# Patient Record
Sex: Female | Born: 1945 | Race: White | Hispanic: No | Marital: Married | Smoking: Former smoker
Health system: Southern US, Community
[De-identification: ages and names within clinical notes are randomized; demographics above are authoritative.]

## PROBLEM LIST (undated history)

## (undated) DIAGNOSIS — M8580 Other specified disorders of bone density and structure, unspecified site: Secondary | ICD-10-CM

## (undated) DIAGNOSIS — T7840XA Allergy, unspecified, initial encounter: Secondary | ICD-10-CM

## (undated) DIAGNOSIS — J45909 Unspecified asthma, uncomplicated: Secondary | ICD-10-CM

## (undated) DIAGNOSIS — N952 Postmenopausal atrophic vaginitis: Secondary | ICD-10-CM

## (undated) HISTORY — DX: Allergy, unspecified, initial encounter: T78.40XA

## (undated) HISTORY — DX: Postmenopausal atrophic vaginitis: N95.2

## (undated) HISTORY — PX: ABDOMINAL HYSTERECTOMY: SHX81

## (undated) HISTORY — DX: Unspecified asthma, uncomplicated: J45.909

## (undated) HISTORY — PX: SPINE SURGERY: SHX786

## (undated) HISTORY — PX: KNEE SURGERY: SHX244

## (undated) HISTORY — DX: Other specified disorders of bone density and structure, unspecified site: M85.80

---

## 1999-12-13 ENCOUNTER — Encounter: Payer: Self-pay | Admitting: *Deleted

## 1999-12-13 ENCOUNTER — Emergency Department (HOSPITAL_COMMUNITY): Admission: EM | Admit: 1999-12-13 | Discharge: 1999-12-13 | Payer: Self-pay | Admitting: *Deleted

## 2000-11-01 ENCOUNTER — Emergency Department (HOSPITAL_COMMUNITY): Admission: EM | Admit: 2000-11-01 | Discharge: 2000-11-01 | Payer: Self-pay | Admitting: Emergency Medicine

## 2000-11-01 ENCOUNTER — Encounter: Payer: Self-pay | Admitting: Emergency Medicine

## 2001-03-23 ENCOUNTER — Ambulatory Visit (HOSPITAL_COMMUNITY): Admission: RE | Admit: 2001-03-23 | Discharge: 2001-03-23 | Payer: Self-pay | Admitting: Family Medicine

## 2001-03-23 ENCOUNTER — Encounter: Payer: Self-pay | Admitting: Family Medicine

## 2001-07-07 ENCOUNTER — Encounter: Payer: Self-pay | Admitting: Physical Medicine and Rehabilitation

## 2001-07-07 ENCOUNTER — Ambulatory Visit (HOSPITAL_COMMUNITY)
Admission: RE | Admit: 2001-07-07 | Discharge: 2001-07-07 | Payer: Self-pay | Admitting: Physical Medicine and Rehabilitation

## 2002-05-28 ENCOUNTER — Encounter: Payer: Self-pay | Admitting: Family Medicine

## 2002-05-28 ENCOUNTER — Encounter: Admission: RE | Admit: 2002-05-28 | Discharge: 2002-05-28 | Payer: Self-pay | Admitting: Family Medicine

## 2002-08-23 ENCOUNTER — Ambulatory Visit (HOSPITAL_COMMUNITY)
Admission: RE | Admit: 2002-08-23 | Discharge: 2002-08-23 | Payer: Self-pay | Admitting: Physical Medicine and Rehabilitation

## 2002-08-23 ENCOUNTER — Encounter: Payer: Self-pay | Admitting: Physical Medicine and Rehabilitation

## 2003-05-12 ENCOUNTER — Ambulatory Visit (HOSPITAL_COMMUNITY): Admission: RE | Admit: 2003-05-12 | Discharge: 2003-05-13 | Payer: Self-pay | Admitting: Orthopaedic Surgery

## 2003-05-17 ENCOUNTER — Emergency Department (HOSPITAL_COMMUNITY): Admission: EM | Admit: 2003-05-17 | Discharge: 2003-05-17 | Payer: Self-pay | Admitting: Emergency Medicine

## 2003-08-02 ENCOUNTER — Ambulatory Visit (HOSPITAL_COMMUNITY): Admission: RE | Admit: 2003-08-02 | Discharge: 2003-08-02 | Payer: Self-pay | Admitting: Family Medicine

## 2005-02-18 ENCOUNTER — Emergency Department (HOSPITAL_COMMUNITY): Admission: EM | Admit: 2005-02-18 | Discharge: 2005-02-18 | Payer: Self-pay | Admitting: Emergency Medicine

## 2007-09-10 ENCOUNTER — Ambulatory Visit: Payer: Self-pay | Admitting: Internal Medicine

## 2007-09-10 DIAGNOSIS — M797 Fibromyalgia: Secondary | ICD-10-CM

## 2007-09-10 DIAGNOSIS — R5381 Other malaise: Secondary | ICD-10-CM | POA: Insufficient documentation

## 2007-09-10 DIAGNOSIS — J309 Allergic rhinitis, unspecified: Secondary | ICD-10-CM | POA: Insufficient documentation

## 2007-09-10 DIAGNOSIS — K219 Gastro-esophageal reflux disease without esophagitis: Secondary | ICD-10-CM | POA: Insufficient documentation

## 2007-09-10 DIAGNOSIS — R5383 Other fatigue: Secondary | ICD-10-CM

## 2007-09-16 ENCOUNTER — Encounter (INDEPENDENT_AMBULATORY_CARE_PROVIDER_SITE_OTHER): Payer: Self-pay | Admitting: Internal Medicine

## 2007-09-16 ENCOUNTER — Telehealth (INDEPENDENT_AMBULATORY_CARE_PROVIDER_SITE_OTHER): Payer: Self-pay | Admitting: *Deleted

## 2007-09-16 LAB — CONVERTED CEMR LAB
Alkaline Phosphatase: 93 units/L (ref 39–117)
Basophils Relative: 1 % (ref 0–1)
Calcium: 9.9 mg/dL (ref 8.4–10.5)
Cholesterol: 249 mg/dL — ABNORMAL HIGH (ref 0–200)
Creatinine, Ser: 0.73 mg/dL (ref 0.40–1.20)
Eosinophils Relative: 4 % (ref 0–5)
LDL Cholesterol: 153 mg/dL — ABNORMAL HIGH (ref 0–99)
Lymphocytes Relative: 36 % (ref 12–46)
MCHC: 32 g/dL (ref 30.0–36.0)
Monocytes Absolute: 0.3 10*3/uL (ref 0.1–1.0)
Monocytes Relative: 7 % (ref 3–12)
Neutrophils Relative %: 53 % (ref 43–77)
Platelets: 233 10*3/uL (ref 150–400)
Potassium: 4.2 meq/L (ref 3.5–5.3)
RDW: 13.6 % (ref 11.5–15.5)
Sodium: 141 meq/L (ref 135–145)
Total CHOL/HDL Ratio: 5.7
Total Protein: 7 g/dL (ref 6.0–8.3)
Triglycerides: 258 mg/dL — ABNORMAL HIGH (ref <150)
VLDL: 52 mg/dL — ABNORMAL HIGH (ref 0–40)

## 2007-09-17 ENCOUNTER — Ambulatory Visit (HOSPITAL_COMMUNITY): Admission: RE | Admit: 2007-09-17 | Discharge: 2007-09-17 | Payer: Self-pay | Admitting: Internal Medicine

## 2007-09-21 ENCOUNTER — Encounter (INDEPENDENT_AMBULATORY_CARE_PROVIDER_SITE_OTHER): Payer: Self-pay | Admitting: Internal Medicine

## 2007-11-26 ENCOUNTER — Encounter (INDEPENDENT_AMBULATORY_CARE_PROVIDER_SITE_OTHER): Payer: Self-pay | Admitting: Internal Medicine

## 2007-11-30 ENCOUNTER — Encounter (INDEPENDENT_AMBULATORY_CARE_PROVIDER_SITE_OTHER): Payer: Self-pay | Admitting: Internal Medicine

## 2007-12-16 ENCOUNTER — Telehealth (INDEPENDENT_AMBULATORY_CARE_PROVIDER_SITE_OTHER): Payer: Self-pay | Admitting: *Deleted

## 2008-01-20 ENCOUNTER — Ambulatory Visit: Payer: Self-pay | Admitting: Internal Medicine

## 2008-01-21 ENCOUNTER — Telehealth (INDEPENDENT_AMBULATORY_CARE_PROVIDER_SITE_OTHER): Payer: Self-pay | Admitting: *Deleted

## 2008-04-12 ENCOUNTER — Telehealth (INDEPENDENT_AMBULATORY_CARE_PROVIDER_SITE_OTHER): Payer: Self-pay | Admitting: *Deleted

## 2008-04-13 ENCOUNTER — Encounter (INDEPENDENT_AMBULATORY_CARE_PROVIDER_SITE_OTHER): Payer: Self-pay | Admitting: Internal Medicine

## 2008-08-02 ENCOUNTER — Encounter (INDEPENDENT_AMBULATORY_CARE_PROVIDER_SITE_OTHER): Payer: Self-pay | Admitting: *Deleted

## 2008-09-28 ENCOUNTER — Ambulatory Visit: Payer: Self-pay | Admitting: Internal Medicine

## 2008-09-28 DIAGNOSIS — R599 Enlarged lymph nodes, unspecified: Secondary | ICD-10-CM | POA: Insufficient documentation

## 2008-09-29 ENCOUNTER — Ambulatory Visit (HOSPITAL_COMMUNITY): Admission: RE | Admit: 2008-09-29 | Discharge: 2008-09-29 | Payer: Self-pay | Admitting: Internal Medicine

## 2008-09-30 ENCOUNTER — Encounter (INDEPENDENT_AMBULATORY_CARE_PROVIDER_SITE_OTHER): Payer: Self-pay | Admitting: Internal Medicine

## 2008-09-30 DIAGNOSIS — K115 Sialolithiasis: Secondary | ICD-10-CM | POA: Insufficient documentation

## 2008-10-05 ENCOUNTER — Encounter (INDEPENDENT_AMBULATORY_CARE_PROVIDER_SITE_OTHER): Payer: Self-pay | Admitting: Internal Medicine

## 2008-10-11 ENCOUNTER — Encounter (INDEPENDENT_AMBULATORY_CARE_PROVIDER_SITE_OTHER): Payer: Self-pay | Admitting: Internal Medicine

## 2008-10-24 ENCOUNTER — Encounter (INDEPENDENT_AMBULATORY_CARE_PROVIDER_SITE_OTHER): Payer: Self-pay | Admitting: Otolaryngology

## 2008-10-24 ENCOUNTER — Ambulatory Visit (HOSPITAL_BASED_OUTPATIENT_CLINIC_OR_DEPARTMENT_OTHER): Admission: RE | Admit: 2008-10-24 | Discharge: 2008-10-25 | Payer: Self-pay | Admitting: Otolaryngology

## 2008-11-11 ENCOUNTER — Encounter (INDEPENDENT_AMBULATORY_CARE_PROVIDER_SITE_OTHER): Payer: Self-pay | Admitting: Internal Medicine

## 2008-11-14 ENCOUNTER — Encounter (INDEPENDENT_AMBULATORY_CARE_PROVIDER_SITE_OTHER): Payer: Self-pay | Admitting: Internal Medicine

## 2010-07-17 NOTE — Op Note (Signed)
NAMESHAKEDRA, BEAM                 ACCOUNT NO.:  [id number]   MEDICAL RECORD NO.:  [id number]          PATIENT TYPE:  AMB   LOCATION:  DSC                          FACILITY:  [facility]   PHYSICIAN:  Karol T. Lazarus Salines, M.D. DATE OF BIRTH:  05-Jul-1945   DATE OF PROCEDURE:  10/24/2008  DATE OF DISCHARGE:                               OPERATIVE REPORT   PREOPERATIVE DIAGNOSIS:  Left submandibular sialadenitis/sialolithiasis.   POSTOPERATIVE DIAGNOSIS:  Left submandibular  sialadenitis/sialolithiasis.   PROCEDURE PERFORMED:  Left submandibular gland excision.   SURGEON:  Gloris Manchester. Lazarus Salines, MD   ASSISTANT:  Dr. Pollyann Kennedy.   ANESTHESIA:  General orotracheal.   BLOOD LOSS:  Minimal.   COMPLICATIONS:  None.   FINDINGS:  A normal-sized submandibular gland with a roughly 7-mm stone  lodged in the duct just anterior to the hilum.  No other identified  stones.  Minimal fibrosis.   DESCRIPTION OF PROCEDURE:  With the patient in a comfortable supine  position, general orotracheal anesthesia was induced without difficulty.  At an appropriate level, the neck was extended and the head rotated to  the right for access to the left neck.  The head was appropriately  supported.  The neck was palpated with the findings as described above.  The oral cavity was also palpated with a stone palpable in the posterior  lingual gutter on the left side.  No other stones were noted.  An early  skin wrinkle was identified in the left neck and was infiltrated with 5  mL of 1% Xylocaine with 1:100,000 epinephrine.  A sterile preparation  and draping of left neck was accomplished in the standard fashion.   The wrinkle was again identified.  A 6-cm incision was marked and then  executed and carried down through skin relatively abundant subcutaneous  fat to the platysma layer, which was lysed using a cautery.  A  subplatysmal plane was raised upwards almost to the level of the body of  the mandible.  With palpation,  the inferior pole of the submandibular  gland was identified, and the fascia was opened at this level and the  dissection was carried out beneath the fascia.  Dissection was worked  around the gland.  At the posterior aspect of the gland, branches of the  facial artery and vein were controlled with silk ligatures entering the  gland and again exiting the gland.  The mylohyoid muscle was identified  and retracted forward.  The dissection was carried deep to the mandible  and the lingual nerve was identified and protected.  The submandibular  ganglion was cross-clamped and controlled with silk ligature.  The gland  was dissected further forward.  The hypoglossal nerve was identified,  but was not dissected.  Some small branches of ranine vein were  controlled with silk ligature.  With palpation, the stone was  identified.  Working anterior to the stone, the duct was identified and  was cross-clamped, amputated, and ligated with 3-0 silk.  The remainder  of the dissection beneath the mylohyoid muscle was completed with  protection of both lingual and hypoglossal  nerves.  The specimen was  sent for permanent interpretation.  One small area of bleeding was  controlled with cautery away from the hypoglossal nerve.  Valsalva was  administered and no significant bleeding was identified.  The wound was  thoroughly irrigated.   A 7-French round drain was placed in the wound and secured to the skin  with a 3-0 Ethilon stitch.  The platysma layer was reapproximated with  interrupted 4-0 chromic suture.  The skin was closed in a cosmetic  fashion with a 5-0 running subcuticular Ethilon.  The wound was cleaned,  and a small amount of bacitracin ointment was applied.  The drain was  placed on suction and observed to be functional.  Hemostasis was  observed.  At this point, the procedure was completed.  The patient was  returned to Anesthesia, awakened, extubated, and transferred to recovery  in stable  condition.   COMMENT:  A 65 year old white female with recent history of repeated  episodes of swelling and pain in her left submandibular gland with a  stone in the hilum identified on CT scan was the indication for today's  procedure.  Anticipate routine postoperative recovery with attention to  ice, elevation, analgesia.  We will observe 23-hour extended recovery  and remove the drain in the morning and discharge her to her home.      Gloris Manchester. Lazarus Salines, M.D.  Electronically Signed     KTW/MEDQ  D:  10/24/2008  T:  10/24/2008  Job:  [id number]   cc:   Erle Crocker, M.D.

## 2010-07-20 NOTE — Op Note (Signed)
Jennifer Hays, Jennifer Hays                             ACCOUNT NO.:  [id number]   MEDICAL RECORD NO.:  [id number]                   PATIENT TYPE:  OIB   LOCATION:  2899                                 FACILITY:  [facility]   PHYSICIAN:  Sharolyn Douglas, M.D.                     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   POSTOPERATIVE DIAGNOSIS:  L4-L5 lateral recess stenosis and herniated  nucleus pulposus with back pain and radiculopathy.   PROCEDURE:  Right sided L4-L5 lateral recess decompression and discectomy  utilizing the Maxess retractor system.   SURGEON:  Sharolyn Douglas, M.D.   ASSISTANT:  Verlin Fester, P.A.   ANESTHESIA:  General endotracheal anesthesia.   COMPLICATIONS:  None.   INDICATIONS FOR PROCEDURE:  The patient is a 65 year old female with several  years of back and bilateral leg pain.  Plain radiographs demonstrate a 20  degree right lumbar curve measured from T12 to L4.  MRI scan showed a broad  based right sided disc herniation at L4-L5 which flattens the right side of  the thecal sac and narrows the lateral recess.  She has failed all  conservative treatment options.  She has seen a neurosurgeon who recommended  an L4-L5 discectomy.  Because of her symptoms which have been refractory to  all treatment options, she elected to undergo L4-L5 lateral recess  decompression and discectomy on the right side utilizing the Maxess  retractor system in hopes of improving her symptoms.  She understands that a  component of her pain is related to degenerative disc disease as well as  scoliosis and she is likely to continue to have some degree of discomfort.  The risks, benefits, and alternatives were reviewed.   PROCEDURE:  The patient was properly identified in the holding area,  taken  to the operating room.  She underwent general endotracheal anesthesia  without difficulty.  She was carefully positioned prone onto the Wilson  frame.  All bony prominences were padded and her face and eyes were  protected at all times.  Her back was prepped and draped in the usual  sterile fashion.  A 2 cm incision was made over the L4-L5 interspace just  off the right side of midline localized with fluoroscopy.  The deep fascia  was incised.  Dilators from the Maxess retractor system were utilized to  dock onto the L4-L5 interspace.  The Maxess retractor was placed over the  final dilator with the 16 mm blade.  The retractor was attached to the table  using the attachment arm.  The retractor was expanded dilating the  paraspinal muscles.  The microscope was draped and brought into the field.  We  confirmed our position with fluoroscopy.  The lamina and L4-L5 interspace  were debrided of soft tissue.  A high speed bur was used to remove the  inferior 1/3 of the L4 lamina as well as the medial 1/3 of the facet joint  complex.  The ligamentum flavum was elevated.  The L5 nerve root was  identified and gently retracted medially.  A disc bulge was immediately  identified.  The disc space was entered with a 15 blade.  Epstein curets  were used to decompress the disc herniation into the interspace.  Pituitary  rongeurs were used to remove the loose disc material.  The disc space was  copiously irrigated with angiocath irrigation.  Several small fragments were  floated out and removed with a pituitary.  The wound was irrigated.  The  spinal canal was explored with a blunt probe and it was felt to be  adequately decompressed.  2 mL of Fentanyl was left in the epidural space  for postoperative analgesia.  A small piece of Gelfoam was left over the  exposed epidural space.  The deep fascia was closed with 0 Vicryl followed  by 2-0 Vicryl on the subcutaneous layer and Dermabond to approximate  the  skin edges.  The patient was turned supine, extubated without difficulty,  transferred to the recovery room in stable condition able to move her upper  and lower extremities.                                               Sharolyn Douglas, M.D.    MC/MEDQ  D:  05/12/2003  T:  05/12/2003  Job:  [id number]

## 2011-03-13 DIAGNOSIS — J309 Allergic rhinitis, unspecified: Secondary | ICD-10-CM | POA: Diagnosis not present

## 2011-03-13 DIAGNOSIS — G589 Mononeuropathy, unspecified: Secondary | ICD-10-CM | POA: Diagnosis not present

## 2011-05-01 DIAGNOSIS — R300 Dysuria: Secondary | ICD-10-CM | POA: Diagnosis not present

## 2011-05-16 DIAGNOSIS — R300 Dysuria: Secondary | ICD-10-CM | POA: Diagnosis not present

## 2011-05-23 DIAGNOSIS — N390 Urinary tract infection, site not specified: Secondary | ICD-10-CM | POA: Diagnosis not present

## 2011-05-23 DIAGNOSIS — N952 Postmenopausal atrophic vaginitis: Secondary | ICD-10-CM | POA: Diagnosis not present

## 2011-08-15 DIAGNOSIS — M79609 Pain in unspecified limb: Secondary | ICD-10-CM | POA: Diagnosis not present

## 2011-08-15 DIAGNOSIS — R42 Dizziness and giddiness: Secondary | ICD-10-CM | POA: Diagnosis not present

## 2011-09-26 DIAGNOSIS — Z0000 Encounter for general adult medical examination without abnormal findings: Secondary | ICD-10-CM | POA: Diagnosis not present

## 2011-10-01 ENCOUNTER — Other Ambulatory Visit: Payer: Self-pay | Admitting: Family Medicine

## 2011-10-01 DIAGNOSIS — M810 Age-related osteoporosis without current pathological fracture: Secondary | ICD-10-CM

## 2011-10-01 DIAGNOSIS — Z139 Encounter for screening, unspecified: Secondary | ICD-10-CM

## 2011-10-07 ENCOUNTER — Other Ambulatory Visit (HOSPITAL_COMMUNITY): Payer: Self-pay

## 2011-10-07 ENCOUNTER — Ambulatory Visit (HOSPITAL_COMMUNITY): Payer: Self-pay

## 2011-10-08 ENCOUNTER — Ambulatory Visit (HOSPITAL_COMMUNITY)
Admission: RE | Admit: 2011-10-08 | Discharge: 2011-10-08 | Disposition: A | Discharge status: Home / Self Care | Payer: BC Managed Care – PPO | Source: Ambulatory Visit | Attending: Family Medicine | Admitting: Family Medicine

## 2011-10-08 DIAGNOSIS — Z13820 Encounter for screening for osteoporosis: Secondary | ICD-10-CM | POA: Insufficient documentation

## 2011-10-08 DIAGNOSIS — M949 Disorder of cartilage, unspecified: Secondary | ICD-10-CM | POA: Insufficient documentation

## 2011-10-08 DIAGNOSIS — M810 Age-related osteoporosis without current pathological fracture: Secondary | ICD-10-CM

## 2011-10-08 DIAGNOSIS — Z139 Encounter for screening, unspecified: Secondary | ICD-10-CM

## 2011-10-08 DIAGNOSIS — Z780 Asymptomatic menopausal state: Secondary | ICD-10-CM | POA: Insufficient documentation

## 2011-10-08 DIAGNOSIS — E559 Vitamin D deficiency, unspecified: Secondary | ICD-10-CM | POA: Diagnosis not present

## 2011-10-08 DIAGNOSIS — M899 Disorder of bone, unspecified: Secondary | ICD-10-CM | POA: Diagnosis not present

## 2011-10-08 DIAGNOSIS — Z1231 Encounter for screening mammogram for malignant neoplasm of breast: Secondary | ICD-10-CM | POA: Insufficient documentation

## 2011-10-31 DIAGNOSIS — R0789 Other chest pain: Secondary | ICD-10-CM | POA: Diagnosis not present

## 2011-10-31 DIAGNOSIS — Z880 Allergy status to penicillin: Secondary | ICD-10-CM | POA: Diagnosis not present

## 2011-10-31 DIAGNOSIS — R071 Chest pain on breathing: Secondary | ICD-10-CM | POA: Diagnosis not present

## 2011-10-31 DIAGNOSIS — J45909 Unspecified asthma, uncomplicated: Secondary | ICD-10-CM | POA: Diagnosis not present

## 2011-10-31 DIAGNOSIS — IMO0001 Reserved for inherently not codable concepts without codable children: Secondary | ICD-10-CM | POA: Diagnosis not present

## 2011-10-31 DIAGNOSIS — R079 Chest pain, unspecified: Secondary | ICD-10-CM | POA: Diagnosis not present

## 2011-10-31 DIAGNOSIS — IMO0002 Reserved for concepts with insufficient information to code with codable children: Secondary | ICD-10-CM | POA: Diagnosis not present

## 2011-10-31 DIAGNOSIS — R5383 Other fatigue: Secondary | ICD-10-CM | POA: Diagnosis not present

## 2011-10-31 DIAGNOSIS — M810 Age-related osteoporosis without current pathological fracture: Secondary | ICD-10-CM | POA: Diagnosis not present

## 2011-10-31 DIAGNOSIS — M5380 Other specified dorsopathies, site unspecified: Secondary | ICD-10-CM | POA: Diagnosis not present

## 2011-10-31 DIAGNOSIS — Z79899 Other long term (current) drug therapy: Secondary | ICD-10-CM | POA: Diagnosis not present

## 2011-10-31 DIAGNOSIS — R300 Dysuria: Secondary | ICD-10-CM | POA: Diagnosis not present

## 2011-10-31 DIAGNOSIS — E785 Hyperlipidemia, unspecified: Secondary | ICD-10-CM | POA: Diagnosis not present

## 2011-10-31 DIAGNOSIS — Z8249 Family history of ischemic heart disease and other diseases of the circulatory system: Secondary | ICD-10-CM | POA: Diagnosis not present

## 2011-11-01 DIAGNOSIS — R079 Chest pain, unspecified: Secondary | ICD-10-CM | POA: Diagnosis not present

## 2011-11-01 DIAGNOSIS — IMO0001 Reserved for inherently not codable concepts without codable children: Secondary | ICD-10-CM | POA: Diagnosis not present

## 2012-01-08 DIAGNOSIS — E559 Vitamin D deficiency, unspecified: Secondary | ICD-10-CM | POA: Diagnosis not present

## 2012-01-08 DIAGNOSIS — E782 Mixed hyperlipidemia: Secondary | ICD-10-CM | POA: Diagnosis not present

## 2012-04-08 DIAGNOSIS — E782 Mixed hyperlipidemia: Secondary | ICD-10-CM | POA: Diagnosis not present

## 2012-04-08 DIAGNOSIS — J309 Allergic rhinitis, unspecified: Secondary | ICD-10-CM | POA: Diagnosis not present

## 2012-06-16 ENCOUNTER — Telehealth: Payer: Self-pay

## 2012-06-16 ENCOUNTER — Telehealth: Payer: Self-pay | Admitting: Family Medicine

## 2012-06-16 ENCOUNTER — Other Ambulatory Visit: Payer: Self-pay | Admitting: Family Medicine

## 2012-06-16 DIAGNOSIS — J45901 Unspecified asthma with (acute) exacerbation: Secondary | ICD-10-CM

## 2012-06-16 MED ORDER — PREDNISONE 10 MG PO TABS: 40.0000 mg | ORAL_TABLET | Freq: Every day | ORAL | Status: DC

## 2012-06-16 NOTE — Telephone Encounter (Signed)
Pt called to report she was having asthma flare up Spoke with dr Modesto Charon and med orderd and sent to [facility]

## 2012-06-16 NOTE — Telephone Encounter (Signed)
Pt aware med sent to [facility]

## 2012-06-16 NOTE — Telephone Encounter (Signed)
[place] and has  Severe allergy flare ups and asthma.Has her nebulizer and  Both albuterol and xopenex. Did better with the albuterol. Has pulmicort. Coughing and wheezing. Needs something else. Plan: Prednisone with a tapering dose. To the local Urgent care or ED if not resolved or  Responded. Prednisone ordered in Epic.

## 2012-08-10 ENCOUNTER — Ambulatory Visit (INDEPENDENT_AMBULATORY_CARE_PROVIDER_SITE_OTHER): Payer: BC Managed Care – PPO | Admitting: Family Medicine

## 2012-08-10 ENCOUNTER — Telehealth: Payer: Self-pay | Admitting: Family Medicine

## 2012-08-10 ENCOUNTER — Encounter: Payer: Self-pay | Admitting: Family Medicine

## 2012-08-10 VITALS — BP 122/78 | HR 89 | Temp 97.8°F | Wt 176.2 lb

## 2012-08-10 DIAGNOSIS — E785 Hyperlipidemia, unspecified: Secondary | ICD-10-CM

## 2012-08-10 DIAGNOSIS — J4520 Mild intermittent asthma, uncomplicated: Secondary | ICD-10-CM

## 2012-08-10 DIAGNOSIS — R350 Frequency of micturition: Secondary | ICD-10-CM | POA: Diagnosis not present

## 2012-08-10 DIAGNOSIS — IMO0001 Reserved for inherently not codable concepts without codable children: Secondary | ICD-10-CM | POA: Insufficient documentation

## 2012-08-10 DIAGNOSIS — J45909 Unspecified asthma, uncomplicated: Secondary | ICD-10-CM | POA: Diagnosis not present

## 2012-08-10 DIAGNOSIS — K219 Gastro-esophageal reflux disease without esophagitis: Secondary | ICD-10-CM

## 2012-08-10 DIAGNOSIS — J309 Allergic rhinitis, unspecified: Secondary | ICD-10-CM

## 2012-08-10 LAB — POCT URINALYSIS DIPSTICK
Bilirubin, UA: NEGATIVE
Blood, UA: NEGATIVE
Glucose, UA: NEGATIVE
Ketones, UA: NEGATIVE
Leukocytes, UA: NEGATIVE
Nitrite, UA: NEGATIVE
Protein, UA: NEGATIVE
Spec Grav, UA: 1.02
Urobilinogen, UA: NEGATIVE
pH, UA: 5

## 2012-08-10 LAB — POCT UA - MICROSCOPIC ONLY
Casts, Ur, LPF, POC: NEGATIVE
Crystals, Ur, HPF, POC: NEGATIVE
Yeast, UA: NEGATIVE

## 2012-08-10 MED ORDER — FLUTICASONE-SALMETEROL 250-50 MCG/DOSE IN AEPB: 1.0000 | INHALATION_SPRAY | Freq: Two times a day (BID) | RESPIRATORY_TRACT | Status: DC

## 2012-08-10 MED ORDER — CEFDINIR 300 MG PO CAPS: 300.0000 mg | ORAL_CAPSULE | Freq: Two times a day (BID) | ORAL | Status: DC

## 2012-08-10 NOTE — Telephone Encounter (Signed)
appt made

## 2012-08-10 NOTE — Progress Notes (Signed)
Patient ID: Jennifer Hays, female   DOB: [birth year], 67 y.o.   MRN: [id number] SUBJECTIVE: Chief Complaint  Patient presents with  . Acute Visit    UTI  thinks fenfofibrate causeing decreased urilne   HPI: Dysuria, frequent small volume. Has had Group B strep infection in UTI before. Wonders if it could be the fenofibrate since this happened after she started it. No fever ,no vaginal discharge , no back pain.  PMH/PSH: reviewed/updated in Epic  SH/FH: reviewed/updated in Epic  Allergies: reviewed/updated in Epic  Medications: reviewed/updated in Epic  Immunizations: reviewed/updated in Epic  ROS: As above in the HPI. All other systems are stable or negative.  OBJECTIVE: APPEARANCE:  Patient in no acute distress.The patient appeared well nourished and normally developed. Acyanotic. Waist: VITAL SIGNS:BP 122/78  Pulse 89  Temp(Src) 97.8 F (36.6 C) (Oral)  Wt 176 lb 3.2 oz (79.924 kg)  BMI 27.17 kg/m2 WF Patient known to me from [facility].  SKIN: warm and  Dry without overt rashes, tattoos and scars  HEAD and Neck: without JVD, Head and scalp: normal Eyes:No scleral icterus. Fundi normal, eye movements normal. Ears: Auricle normal, canal normal, Tympanic membranes normal, insufflation normal. Nose: normal Throat: normal Neck & thyroid: normal  CHEST & LUNGS: Chest wall: normal Lungs: Coarse breath sounds. Prolonged expiratory phase.Marland Kitchen No wheezes, no rales.  CVS: Reveals the PMI to be normally located. Regular rhythm, First and Second Heart sounds are normal,  absence of murmurs, rubs or gallops. Peripheral vasculature: Radial pulses: normal Dorsal pedis pulses: normal Posterior pulses: normal  ABDOMEN:  Appearance: normal Benign, no organomegaly, no masses, no Abdominal Aortic enlargement. No Guarding , no rebound. No Bruits. Bowel sounds: normal  RECTAL: N/A GU: N/A  EXTREMETIES: nonedematous. Both Femoral and Pedal pulses are normal.  MUSCULOSKELETAL:   Ambulates with a cane.  NEUROLOGIC: oriented to time,place and person; nonfocal.  ASSESSMENT: Frequency - Plan: POCT urinalysis dipstick, POCT UA - Microscopic Only, Urine culture, cefdinir (OMNICEF) 300 MG capsule  Intrinsic asthma, uncomplicated - Plan: Fluticasone-Salmeterol (ADVAIR DISKUS) 250-50 MCG/DOSE AEPB  HYPERLIPIDEMIA  GERD  ALLERGIC RHINITIS   PLAN: Orders Placed This Encounter  Procedures  . Urine culture  . POCT urinalysis dipstick  . POCT UA - Microscopic Only   There are no discontinued medications. Current outpatient prescriptions:albuterol (PROVENTIL) (2.5 MG/3ML) 0.083% nebulizer solution, Take 2.5 mg by nebulization every 6 (six) hours as needed for wheezing., Disp: , Rfl: ;  budesonide (PULMICORT) 1 MG/2ML nebulizer solution, Take 1 mg by nebulization daily., Disp: , Rfl: ;  fenofibrate 54 MG tablet, Take 54 mg by mouth daily., Disp: , Rfl:  Fluticasone-Salmeterol (ADVAIR) 250-50 MCG/DOSE AEPB, Inhale 1 puff into the lungs every 12 (twelve) hours., Disp: , Rfl: ;  HYDROcodone-acetaminophen (NORCO) 10-325 MG per tablet, Take 1 tablet by mouth every 6 (six) hours as needed for pain., Disp: , Rfl: ;  cefdinir (OMNICEF) 300 MG capsule, Take 1 capsule (300 mg total) by mouth 2 (two) times daily., Disp: 20 capsule, Rfl: 0 Fluticasone-Salmeterol (ADVAIR DISKUS) 250-50 MCG/DOSE AEPB, Inhale 1 puff into the lungs 2 (two) times daily., Disp: 1 each, Rfl: 5;  predniSONE (DELTASONE) 10 MG tablet, Take 4 tablets (40 mg total) by mouth daily. For 3 days, then 3 tabs daily for  2 days, then 2 tabs daily for 2 days, then 1 tab daily for 2 days, Disp: 24 tablet, Rfl: 0 Meds ordered this encounter  Medications  . fenofibrate 54 MG tablet    Sig:  Take 54 mg by mouth daily.  . Fluticasone-Salmeterol (ADVAIR) 250-50 MCG/DOSE AEPB    Sig: Inhale 1 puff into the lungs every 12 (twelve) hours.  Marland Kitchen albuterol (PROVENTIL) (2.5 MG/3ML) 0.083% nebulizer solution    Sig: Take 2.5 mg by  nebulization every 6 (six) hours as needed for wheezing.  . budesonide (PULMICORT) 1 MG/2ML nebulizer solution    Sig: Take 1 mg by nebulization daily.  Marland Kitchen HYDROcodone-acetaminophen (NORCO) 10-325 MG per tablet    Sig: Take 1 tablet by mouth every 6 (six) hours as needed for pain.  Marland Kitchen Fluticasone-Salmeterol (ADVAIR DISKUS) 250-50 MCG/DOSE AEPB    Sig: Inhale 1 puff into the lungs 2 (two) times daily.    Dispense:  1 each    Refill:  5  . cefdinir (OMNICEF) 300 MG capsule    Sig: Take 1 capsule (300 mg total) by mouth 2 (two) times daily.    Dispense:  20 capsule    Refill:  0   Await the UCx.  Return if symptoms worsen or fail to improve.  Girtrude Enslin P. Modesto Charon, M.D.

## 2012-08-12 LAB — URINE CULTURE
Colony Count: NO GROWTH
Organism ID, Bacteria: NO GROWTH

## 2012-08-12 NOTE — Progress Notes (Signed)
Quick Note:  Call patient. Labs Urine culture normal. No change in plan. If symptoms persists may need to see urology. ______

## 2012-08-13 ENCOUNTER — Telehealth: Payer: Self-pay | Admitting: Family Medicine

## 2012-08-13 NOTE — Telephone Encounter (Signed)
Pt aware of labs  

## 2012-09-23 ENCOUNTER — Ambulatory Visit: Payer: Self-pay | Admitting: Family Medicine

## 2013-01-05 ENCOUNTER — Ambulatory Visit (INDEPENDENT_AMBULATORY_CARE_PROVIDER_SITE_OTHER): Payer: BC Managed Care – PPO | Admitting: Family Medicine

## 2013-01-05 DIAGNOSIS — Z23 Encounter for immunization: Secondary | ICD-10-CM

## 2013-01-08 ENCOUNTER — Other Ambulatory Visit: Payer: Self-pay | Admitting: *Deleted

## 2013-01-08 ENCOUNTER — Telehealth: Payer: Self-pay | Admitting: Family Medicine

## 2013-01-08 MED ORDER — FENOFIBRATE 54 MG PO TABS: 54.0000 mg | ORAL_TABLET | Freq: Every day | ORAL | Status: DC

## 2013-01-08 NOTE — Telephone Encounter (Signed)
error 

## 2013-01-11 ENCOUNTER — Other Ambulatory Visit: Payer: Self-pay | Admitting: *Deleted

## 2013-01-11 MED ORDER — FENOFIBRATE 54 MG PO TABS: 54.0000 mg | ORAL_TABLET | Freq: Every day | ORAL | Status: DC

## 2013-01-11 NOTE — Telephone Encounter (Signed)
Med refilled.

## 2013-01-18 ENCOUNTER — Encounter: Payer: Self-pay | Admitting: Family Medicine

## 2013-01-18 ENCOUNTER — Ambulatory Visit (INDEPENDENT_AMBULATORY_CARE_PROVIDER_SITE_OTHER): Payer: BC Managed Care – PPO | Admitting: Family Medicine

## 2013-01-18 VITALS — BP 122/70 | HR 78 | Temp 97.6°F | Resp 20 | Ht 65.0 in | Wt 180.0 lb

## 2013-01-18 DIAGNOSIS — E781 Pure hyperglyceridemia: Secondary | ICD-10-CM

## 2013-01-18 DIAGNOSIS — J45909 Unspecified asthma, uncomplicated: Secondary | ICD-10-CM

## 2013-01-18 DIAGNOSIS — Z23 Encounter for immunization: Secondary | ICD-10-CM

## 2013-01-18 DIAGNOSIS — J309 Allergic rhinitis, unspecified: Secondary | ICD-10-CM

## 2013-01-18 DIAGNOSIS — Z1211 Encounter for screening for malignant neoplasm of colon: Secondary | ICD-10-CM

## 2013-01-18 DIAGNOSIS — J4520 Mild intermittent asthma, uncomplicated: Secondary | ICD-10-CM

## 2013-01-18 NOTE — Patient Instructions (Signed)
Continue current medications See which nebulizer you have at home- call us with the names  Prevnar 13 given  Stool cards given I will fax thyroid labs to Dr. Doretha Imus  F/U 4 months

## 2013-01-19 ENCOUNTER — Other Ambulatory Visit: Payer: BC Managed Care – PPO

## 2013-01-19 DIAGNOSIS — Z1211 Encounter for screening for malignant neoplasm of colon: Secondary | ICD-10-CM | POA: Insufficient documentation

## 2013-01-19 DIAGNOSIS — E785 Hyperlipidemia, unspecified: Secondary | ICD-10-CM | POA: Insufficient documentation

## 2013-01-19 LAB — CBC WITH DIFFERENTIAL/PLATELET
Basophils Relative: 0 % (ref 0–1)
Eosinophils Absolute: 0.1 10*3/uL (ref 0.0–0.7)
Lymphocytes Relative: 38 % (ref 12–46)
MCHC: 34.1 g/dL (ref 30.0–36.0)
MCV: 88.9 fL (ref 78.0–100.0)
Neutrophils Relative %: 50 % (ref 43–77)
RDW: 14.1 % (ref 11.5–15.5)
WBC: 3.9 10*3/uL — ABNORMAL LOW (ref 4.0–10.5)

## 2013-01-19 LAB — COMPREHENSIVE METABOLIC PANEL
ALT: 27 U/L (ref 0–35)
Alkaline Phosphatase: 68 U/L (ref 39–117)
Calcium: 9.4 mg/dL (ref 8.4–10.5)
Chloride: 104 mEq/L (ref 96–112)
Glucose, Bld: 81 mg/dL (ref 70–99)
Sodium: 141 mEq/L (ref 135–145)

## 2013-01-19 LAB — LIPID PANEL
Cholesterol: 215 mg/dL — ABNORMAL HIGH (ref 0–200)
Total CHOL/HDL Ratio: 4.3 Ratio
Triglycerides: 187 mg/dL — ABNORMAL HIGH (ref <150)

## 2013-01-19 MED ORDER — FLUTICASONE-SALMETEROL 250-50 MCG/DOSE IN AEPB: 1.0000 | INHALATION_SPRAY | Freq: Two times a day (BID) | RESPIRATORY_TRACT | Status: DC

## 2013-01-19 NOTE — Assessment & Plan Note (Signed)
Currently stable continue her current regimen. She does not require Pulmicort nebulizer solution at this time. She was given her Prevnar 59

## 2013-01-19 NOTE — Assessment & Plan Note (Signed)
Will check her fasting triglyceride level as well as a metabolic panel and liver function on the fenofibrate

## 2013-01-19 NOTE — Assessment & Plan Note (Signed)
Continue Flonase and antihistamine ?

## 2013-01-19 NOTE — Assessment & Plan Note (Signed)
Stool cards

## 2013-01-19 NOTE — Progress Notes (Signed)
  Subjective:    Patient ID: Jennifer Hays, female    DOB: September 04, 1945, 67 y.o.   MRN: [id number]  HPI  Patient here to reestablish care. She was being seen by Dr. Modesto Charon. She did see him once at his new office at [facility] she was last seen at [facility] back in April. The drive is too long to [place] therefore she will continue followup here at [facility] She's history of chronic back pain she is being followed by Dr. Carolyn Stare for her pain management. She's status post radiofrequency been placed secondary to ongoing back pain.  She has history of asthma as well as seasonal allergies she is maintained on at fair and albuterol as needed. She does have either Xopenex or albuterol nebulizer and Pulmicort solution if needed. She did have a flare for asthma few months ago when she was vacationing at [place].  She's history of hyper triglyceridemia she's currently on TriCor she is due for repeat fasting lipids  She was seen by her ophthalmologist who is concerned about slightly bulging eyes and she wondered if she had thyroid studies done (note labs have been done and were normal)  She will like to have the Prevnar 13 shot today Of note she declines colonoscopy but will agree to stool cards  Review of Systems  GEN- denies fatigue, fever, weight loss,weakness, recent illness HEENT- denies eye drainage, change in vision, nasal discharge, CVS- denies chest pain, palpitations RESP- denies SOB, cough, wheeze ABD- denies N/V, change in stools, abd pain GU- denies dysuria, hematuria, dribbling, incontinence MSK- + joint pain, muscle aches, injury Neuro- denies headache, dizziness, syncope, seizure activity      Objective:   Physical Exam  GEN- NAD, alert and oriented x3 HEENT- PERRL, EOMI, non injected sclera, pink conjunctiva, MMM, oropharynx clear Neck- Supple,  CVS- RRR, no murmur RESP-CTAB EXT- No edema Pulses- Radial, DP- 2+       Assessment & Plan:

## 2013-01-21 MED ORDER — FENOFIBRATE 145 MG PO TABS: 145.0000 mg | ORAL_TABLET | Freq: Every day | ORAL | Status: DC

## 2013-01-21 NOTE — Addendum Note (Signed)
Addended by: Milinda Antis F on: 01/21/2013 09:14 PM   Modules accepted: Orders

## 2013-03-13 ENCOUNTER — Other Ambulatory Visit: Payer: Self-pay | Admitting: Family Medicine

## 2013-03-26 ENCOUNTER — Ambulatory Visit (INDEPENDENT_AMBULATORY_CARE_PROVIDER_SITE_OTHER): Payer: BC Managed Care – PPO | Admitting: Family Medicine

## 2013-03-26 VITALS — BP 138/80 | HR 88 | Temp 98.2°F | Resp 18 | Ht 68.0 in | Wt 178.0 lb

## 2013-03-26 DIAGNOSIS — N390 Urinary tract infection, site not specified: Secondary | ICD-10-CM | POA: Diagnosis not present

## 2013-03-26 DIAGNOSIS — R109 Unspecified abdominal pain: Secondary | ICD-10-CM

## 2013-03-26 LAB — URINALYSIS, ROUTINE W REFLEX MICROSCOPIC
Glucose, UA: NEGATIVE mg/dL
HGB URINE DIPSTICK: NEGATIVE
NITRITE: POSITIVE — AB
PH: 5 (ref 5.0–8.0)
Protein, ur: 30 mg/dL — AB
Specific Gravity, Urine: >1.03 — ABNORMAL HIGH (ref 1.005–1.030)
Urobilinogen, UA: 1 mg/dL (ref 0.0–1.0)

## 2013-03-26 LAB — URINALYSIS, MICROSCOPIC ONLY: Crystals: NONE SEEN

## 2013-03-26 MED ORDER — PHENAZOPYRIDINE HCL 100 MG PO TABS: 100.0000 mg | ORAL_TABLET | Freq: Three times a day (TID) | ORAL | Status: DC | PRN

## 2013-03-26 MED ORDER — CIPROFLOXACIN HCL 500 MG PO TABS: 500.0000 mg | ORAL_TABLET | Freq: Two times a day (BID) | ORAL | Status: DC

## 2013-03-26 NOTE — Patient Instructions (Signed)
UTI Take antibiotics as needed Pyridum for pressure Plenty of fluids We will call if medications need to be changed F/U as previous

## 2013-03-27 LAB — URINE CULTURE: Colony Count: 25000

## 2013-03-28 ENCOUNTER — Encounter: Payer: Self-pay | Admitting: Family Medicine

## 2013-03-28 DIAGNOSIS — N390 Urinary tract infection, site not specified: Secondary | ICD-10-CM | POA: Insufficient documentation

## 2013-03-28 NOTE — Assessment & Plan Note (Signed)
Will culture urine. Will start on Pyridium and antibioticS

## 2013-03-28 NOTE — Progress Notes (Signed)
   Subjective:    Patient ID: LE FERRAZ, female    DOB: [birth year], 68 y.o.   MRN: [id number]  HPI Patient presents with lower abdominal pain and cramping for the past 24 hours. She's also had difficulty urinating and pressure with urinating. She's not had any blood in the urine. Her bowels have been unchanged. She's not had any fever, nausea or vomiting.   Review of Systems  GEN- denies fatigue, fever, weight loss,weakness, recent illness HEENT- denies eye drainage, change in vision, nasal discharge, CVS- denies chest pain, palpitations RESP- denies SOB, cough, wheeze ABD- denies N/V, change in stools, abd pain GU- +dysuria, hematuria, dribbling, incontinence Neuro- denies headache, dizziness, syncope, seizure activity      Objective:   Physical Exam GEN- NAD, alert and oriented x3 CVS- RRR, no murmur RESP-CTAB ABD-NABS,soft, TTP suprapubic region, no rebound, no CVA tenderness Pulses- Radial 2+       Assessment & Plan:

## 2013-03-29 ENCOUNTER — Telehealth: Payer: Self-pay | Admitting: Family Medicine

## 2013-03-29 MED ORDER — CIPROFLOXACIN HCL 500 MG PO TABS
500.0000 mg | ORAL_TABLET | Freq: Two times a day (BID) | ORAL | Status: DC
Start: 2013-03-29 — End: 2013-05-12

## 2013-03-29 NOTE — Telephone Encounter (Signed)
Please call pt, I am extending antibiotics for another 2 days, to give her a week total Have her take a mild laxative like Miralax to see if constipation is also causing the cramps If not better with both I would recommend CT scan of her abdomen and pelvis

## 2013-03-29 NOTE — Telephone Encounter (Signed)
Message copied by Alycia Rossetti on Mon Mar 29, 2013  3:35 PM ------      Message from: Daylene Posey T      Created: Mon Mar 29, 2013 12:12 PM       Pt aware of results, she states that she was ok with the antibiotic but when she took her last one last night she started feeling the cramps again, and when she went to Bathroom to urinate she had a burning sensation and also cramps really bad when having a BM. ------

## 2013-03-29 NOTE — Telephone Encounter (Signed)
Called pt and info was sent to Dr. Buelah Manis for advise

## 2013-03-29 NOTE — Telephone Encounter (Signed)
Pt states that she was getting better yesterday but today its worse PT has took all 9 of the Pyridium and she is cramping like crazy, and it burns when she pees  She is drinking the cranberry juice and she is taking cipo as well  Call back number is [phone]

## 2013-03-29 NOTE — Telephone Encounter (Signed)
Pt aware of message

## 2013-05-11 ENCOUNTER — Other Ambulatory Visit: Payer: Self-pay | Admitting: Family Medicine

## 2013-05-11 DIAGNOSIS — Z1211 Encounter for screening for malignant neoplasm of colon: Secondary | ICD-10-CM

## 2013-05-12 ENCOUNTER — Encounter: Payer: Self-pay | Admitting: Family Medicine

## 2013-05-12 ENCOUNTER — Ambulatory Visit (INDEPENDENT_AMBULATORY_CARE_PROVIDER_SITE_OTHER): Payer: BC Managed Care – PPO | Admitting: Family Medicine

## 2013-05-12 VITALS — BP 124/76 | HR 64 | Temp 97.3°F | Resp 16 | Ht 66.5 in | Wt 171.0 lb

## 2013-05-12 DIAGNOSIS — N390 Urinary tract infection, site not specified: Secondary | ICD-10-CM

## 2013-05-12 DIAGNOSIS — M810 Age-related osteoporosis without current pathological fracture: Secondary | ICD-10-CM

## 2013-05-12 DIAGNOSIS — J45909 Unspecified asthma, uncomplicated: Secondary | ICD-10-CM

## 2013-05-12 DIAGNOSIS — R300 Dysuria: Secondary | ICD-10-CM

## 2013-05-12 DIAGNOSIS — Z1231 Encounter for screening mammogram for malignant neoplasm of breast: Secondary | ICD-10-CM

## 2013-05-12 DIAGNOSIS — E781 Pure hyperglyceridemia: Secondary | ICD-10-CM

## 2013-05-12 LAB — COMPREHENSIVE METABOLIC PANEL
ALK PHOS: 62 U/L (ref 39–117)
ALT: 21 U/L (ref 0–35)
AST: 21 U/L (ref 0–37)
Albumin: 4.3 g/dL (ref 3.5–5.2)
BUN: 13 mg/dL (ref 6–23)
CO2: 28 mEq/L (ref 19–32)
Calcium: 9.7 mg/dL (ref 8.4–10.5)
Chloride: 104 mEq/L (ref 96–112)
Creat: 0.73 mg/dL (ref 0.50–1.10)
Glucose, Bld: 82 mg/dL (ref 70–99)
POTASSIUM: 3.9 meq/L (ref 3.5–5.3)
SODIUM: 141 meq/L (ref 135–145)
TOTAL PROTEIN: 6.8 g/dL (ref 6.0–8.3)
Total Bilirubin: 0.8 mg/dL (ref 0.2–1.2)

## 2013-05-12 LAB — LIPID PANEL
CHOL/HDL RATIO: 3.7 ratio
Cholesterol: 201 mg/dL — ABNORMAL HIGH (ref 0–200)
HDL: 54 mg/dL (ref >39)
LDL CALC: 126 mg/dL — AB (ref 0–99)
TRIGLYCERIDES: 105 mg/dL (ref <150)
VLDL: 21 mg/dL (ref 0–40)

## 2013-05-12 LAB — CBC WITH DIFFERENTIAL/PLATELET
BASOS PCT: 1 % (ref 0–1)
Basophils Absolute: 0 10*3/uL (ref 0.0–0.1)
Eosinophils Absolute: 0.2 10*3/uL (ref 0.0–0.7)
Eosinophils Relative: 4 % (ref 0–5)
HCT: 44.2 % (ref 36.0–46.0)
Hemoglobin: 14.7 g/dL (ref 12.0–15.0)
Lymphocytes Relative: 48 % — ABNORMAL HIGH (ref 12–46)
Lymphs Abs: 1.9 10*3/uL (ref 0.7–4.0)
MCH: 29 pg (ref 26.0–34.0)
MCHC: 33.3 g/dL (ref 30.0–36.0)
MCV: 87.2 fL (ref 78.0–100.0)
MONO ABS: 0.4 10*3/uL (ref 0.1–1.0)
Monocytes Relative: 10 % (ref 3–12)
NEUTROS ABS: 1.4 10*3/uL — AB (ref 1.7–7.7)
NEUTROS PCT: 37 % — AB (ref 43–77)
Platelets: 188 10*3/uL (ref 150–400)
RBC: 5.07 MIL/uL (ref 3.87–5.11)
RDW: 15 % (ref 11.5–15.5)
WBC: 3.9 10*3/uL — ABNORMAL LOW (ref 4.0–10.5)

## 2013-05-12 LAB — URINALYSIS, ROUTINE W REFLEX MICROSCOPIC
BILIRUBIN URINE: NEGATIVE
Glucose, UA: NEGATIVE mg/dL
Hgb urine dipstick: NEGATIVE
Leukocytes, UA: NEGATIVE
Nitrite: NEGATIVE
PROTEIN: 30 mg/dL — AB
Specific Gravity, Urine: >1.03 — ABNORMAL HIGH (ref 1.005–1.030)
UROBILINOGEN UA: 0.2 mg/dL (ref 0.0–1.0)
pH: 5.5 (ref 5.0–8.0)

## 2013-05-12 LAB — URINALYSIS, MICROSCOPIC ONLY
Crystals: NONE SEEN
RBC / HPF: NONE SEEN RBC/hpf (ref <3)

## 2013-05-12 NOTE — Progress Notes (Signed)
Patient ID: Jennifer Hays, female   DOB: 04/21/1945, 68 y.o.   MRN: [id number]   Subjective:    Patient ID: Jennifer Hays, female    DOB: [birth year], 68 y.o.   MRN: [id number]  Patient presents for 4 month F/U, Seasonal allergies/ mild astham wheeze and Urinary track pain  issue here to followup chronic medical problems.  Asthma-she typically takes Advair and uses albuterol as needed. She does have Pulmicort nebs at home if she has a severe exacerbation and is unable to use her Advair she requests a refill on this. She started taking Zyrtec 5 mg at bed time and she's noted some sinus drainage and allergy-like symptoms that started. She's also using nasal saline  She is due for bone density for her osteoporosis she is taking vitamin D at home  She has had some mild dysuria and a twinge of pain in her lower pelvic region she's not had any vaginal discharge or bleeding.  Hypertriglyceridemia-last set of labs had improved she actually did not increase to the 145 mg of TriCor but continued to 54 mg she's also changed her diet and is eating more strict vegetarian she is due for repeat labs today.    Review Of Systems:  GEN- denies fatigue, fever, weight loss,weakness, recent illness HEENT- denies eye drainage, change in vision, nasal discharge, CVS- denies chest pain, palpitations RESP- denies SOB, cough, wheeze ABD- denies N/V, change in stools, abd pain GU- +dysuria, hematuria, dribbling, incontinence MSK- + joint pain, muscle aches, injury Neuro- denies headache, dizziness, syncope, seizure activity       Objective:    BP 124/76  Pulse 64  Temp(Src) 97.3 F (36.3 C)  Resp 16  Ht 5' 6.5" (1.689 m)  Wt 171 lb (77.565 kg)  BMI 27.19 kg/m2 GEN- NAD, alert and oriented x3 HEENT- PERRL, EOMI, non injected sclera, pink conjunctiva, MMM, oropharynx clear Neck- Supple,  CVS- RRR, no murmur RESP-CTAB ABD-NABS,soft,NT,ND, mild suprapubic tenderness, no rebound, no gaurding EXT- No  edema Pulses- Radial, DP- 2+        Assessment & Plan:      Problem List Items Addressed This Visit   Osteoporosis   Hypertriglyceridemia - Primary     Recheck FLP    Relevant Medications      fenofibrate 54 MG tablet   Other Relevant Orders      Comprehensive metabolic panel      Lipid panel      CBC with Differential    Other Visit Diagnoses   Dysuria        Relevant Orders       Urinalysis, Routine w reflex microscopic (Completed)       Urine culture       Note: This dictation was prepared with Dragon dictation along with smaller phrase technology. Any transcriptional errors that result from this process are unintentional.

## 2013-05-12 NOTE — Assessment & Plan Note (Signed)
Currently doing well her medications will continue

## 2013-05-12 NOTE — Assessment & Plan Note (Signed)
Recheck FLP

## 2013-05-12 NOTE — Assessment & Plan Note (Signed)
Recheck urine and urine culture , await results before treatment

## 2013-05-12 NOTE — Patient Instructions (Addendum)
Continue current medications Bone Density to be set up Mammogram We will call with lab results  ( Check vitamins you need around 2000 IU a day )  F/u 4 month

## 2013-05-13 ENCOUNTER — Other Ambulatory Visit: Payer: Self-pay | Admitting: *Deleted

## 2013-05-13 LAB — URINE CULTURE
Colony Count: NO GROWTH
ORGANISM ID, BACTERIA: NO GROWTH

## 2013-05-13 MED ORDER — FENOFIBRATE 54 MG PO TABS: 54.0000 mg | ORAL_TABLET | Freq: Every day | ORAL | Status: DC

## 2013-05-13 NOTE — Telephone Encounter (Signed)
Call placed to patient and patient made aware.   Prescription sent to pharmacy.  

## 2013-08-30 ENCOUNTER — Other Ambulatory Visit: Payer: Self-pay | Admitting: Family Medicine

## 2013-08-30 NOTE — Telephone Encounter (Signed)
Refill appropriate and filled per protocol. 

## 2013-09-20 ENCOUNTER — Other Ambulatory Visit: Payer: Self-pay | Admitting: Family Medicine

## 2013-09-20 NOTE — Telephone Encounter (Signed)
Refill appropriate and filled per protocol. 

## 2013-12-28 ENCOUNTER — Other Ambulatory Visit: Payer: Self-pay | Admitting: Family Medicine

## 2013-12-28 NOTE — Telephone Encounter (Signed)
Medication filled x1 with no refills.   Requires office visit before any further refills can be given.   Letter sent.  

## 2014-01-06 ENCOUNTER — Ambulatory Visit (INDEPENDENT_AMBULATORY_CARE_PROVIDER_SITE_OTHER): Payer: BC Managed Care – PPO | Admitting: *Deleted

## 2014-01-06 DIAGNOSIS — Z23 Encounter for immunization: Secondary | ICD-10-CM | POA: Diagnosis not present

## 2014-01-06 NOTE — Progress Notes (Signed)
Patient ID: Jennifer Hays, female   DOB: August 04, 1945, 68 y.o.   MRN: [id number] Patient seen in office for Influenza Vaccination.   Tolerated IM administration well.

## 2014-01-12 ENCOUNTER — Ambulatory Visit (INDEPENDENT_AMBULATORY_CARE_PROVIDER_SITE_OTHER): Payer: BC Managed Care – PPO | Admitting: Family Medicine

## 2014-01-12 ENCOUNTER — Encounter: Payer: Self-pay | Admitting: Family Medicine

## 2014-01-12 VITALS — BP 118/66 | HR 68 | Temp 97.8°F | Resp 16 | Ht 67.0 in | Wt 177.0 lb

## 2014-01-12 DIAGNOSIS — M810 Age-related osteoporosis without current pathological fracture: Secondary | ICD-10-CM

## 2014-01-12 DIAGNOSIS — E781 Pure hyperglyceridemia: Secondary | ICD-10-CM

## 2014-01-12 DIAGNOSIS — M797 Fibromyalgia: Secondary | ICD-10-CM

## 2014-01-12 DIAGNOSIS — J4520 Mild intermittent asthma, uncomplicated: Secondary | ICD-10-CM

## 2014-01-12 LAB — CBC WITH DIFFERENTIAL/PLATELET
BASOS ABS: 0 10*3/uL (ref 0.0–0.1)
BASOS PCT: 0 % (ref 0–1)
Eosinophils Absolute: 0 10*3/uL (ref 0.0–0.7)
Eosinophils Relative: 1 % (ref 0–5)
HCT: 42.6 % (ref 36.0–46.0)
Hemoglobin: 14.3 g/dL (ref 12.0–15.0)
Lymphocytes Relative: 34 % (ref 12–46)
Lymphs Abs: 1.6 10*3/uL (ref 0.7–4.0)
MCH: 29.5 pg (ref 26.0–34.0)
MCHC: 33.6 g/dL (ref 30.0–36.0)
MCV: 88 fL (ref 78.0–100.0)
Monocytes Absolute: 0.4 10*3/uL (ref 0.1–1.0)
Monocytes Relative: 9 % (ref 3–12)
NEUTROS PCT: 56 % (ref 43–77)
Neutro Abs: 2.6 10*3/uL (ref 1.7–7.7)
PLATELETS: 223 10*3/uL (ref 150–400)
RBC: 4.84 MIL/uL (ref 3.87–5.11)
RDW: 14 % (ref 11.5–15.5)
WBC: 4.6 10*3/uL (ref 4.0–10.5)

## 2014-01-12 LAB — COMPREHENSIVE METABOLIC PANEL
ALBUMIN: 4.2 g/dL (ref 3.5–5.2)
ALK PHOS: 67 U/L (ref 39–117)
ALT: 22 U/L (ref 0–35)
AST: 22 U/L (ref 0–37)
BUN: 12 mg/dL (ref 6–23)
CO2: 26 mEq/L (ref 19–32)
Calcium: 9.7 mg/dL (ref 8.4–10.5)
Chloride: 106 mEq/L (ref 96–112)
Creat: 0.67 mg/dL (ref 0.50–1.10)
GLUCOSE: 86 mg/dL (ref 70–99)
POTASSIUM: 4.1 meq/L (ref 3.5–5.3)
SODIUM: 141 meq/L (ref 135–145)
TOTAL PROTEIN: 6.9 g/dL (ref 6.0–8.3)
Total Bilirubin: 0.6 mg/dL (ref 0.2–1.2)

## 2014-01-12 LAB — LIPID PANEL
CHOLESTEROL: 217 mg/dL — AB (ref 0–200)
HDL: 60 mg/dL (ref >39)
LDL Cholesterol: 127 mg/dL — ABNORMAL HIGH (ref 0–99)
Total CHOL/HDL Ratio: 3.6 Ratio
Triglycerides: 151 mg/dL — ABNORMAL HIGH (ref <150)
VLDL: 30 mg/dL (ref 0–40)

## 2014-01-12 NOTE — Assessment & Plan Note (Signed)
Well controlled on meds, flu shot done

## 2014-01-12 NOTE — Progress Notes (Signed)
Patient ID: Jennifer Hays, female   DOB: 1945/12/02, 68 y.o.   MRN: [id number]   Subjective:    Patient ID: Jennifer Hays, female    DOB: 1945/10/09, 68 y.o.   MRN: [id number]  Patient presents for Medication Review/ Refills  Patient here to follow-up chronic medical problems. She still being seen by the pain clinic Dr. Dema Severin for her chronic back pain she does have pain medication but does not use on a regular basis she has had some epidural injections done recently which Improved her pain.  Os year (has history of osteoporosis currently on calcium and vitamin D she is due for bone density. As well as mammogram.  Hyperlipidemia she is still trying to monitor her diet as she is taking TriCor as prescribed   Review Of Systems:  GEN- denies fatigue, fever, weight loss,weakness, recent illness HEENT- denies eye drainage, change in vision, nasal discharge, CVS- denies chest pain, palpitations RESP- denies SOB, cough, wheeze ABD- denies N/V, change in stools, abd pain GU- denies dysuria, hematuria, dribbling, incontinence MSK- denies joint pain, muscle aches, injury Neuro- denies headache, dizziness, syncope, seizure activity       Objective:    BP 118/66 mmHg  Pulse 68  Temp(Src) 97.8 F (36.6 C) (Oral)  Resp 16  Ht 5\' 7"  (1.702 m)  Wt 177 lb (80.287 kg)  BMI 27.72 kg/m2 GEN- NAD, alert and oriented x3 HEENT- PERRL, EOMI, non injected sclera, pink conjunctiva, MMM, oropharynx clear Neck- Supple, no thyromegaly CVS- RRR, no murmur RESP-CTAB ABD-NABS,soft,NT,ND EXT- No edema Pulses- Radial, DP- 2+        Assessment & Plan:      Problem List Items Addressed This Visit    Osteoporosis   Relevant Orders      Vitamin D, 25-hydroxy   Myalgia and myositis - Primary   Intrinsic asthma   Hypertriglyceridemia   Relevant Orders      Comprehensive metabolic panel      CBC with Differential      Lipid panel      Note: This dictation was prepared with Dragon dictation  along with smaller phrase technology. Any transcriptional errors that result from this process are unintentional.

## 2014-01-12 NOTE — Patient Instructions (Addendum)
Take total 1200mg  of Calcium and Vitamin D 1000-2000IU  Call and make and appointment for mammogram and Bone Density  Continue all other medications F/U 4 months for PHYSICAL

## 2014-01-12 NOTE — Assessment & Plan Note (Signed)
Recheck lipids, on tricor

## 2014-01-12 NOTE — Assessment & Plan Note (Signed)
Calcium and Vitamin D Bone Density

## 2014-01-12 NOTE — Assessment & Plan Note (Signed)
Fibromyalgia stable

## 2014-01-13 LAB — VITAMIN D 25 HYDROXY (VIT D DEFICIENCY, FRACTURES): Vit D, 25-Hydroxy: 45 ng/mL (ref 30–89)

## 2014-01-17 ENCOUNTER — Encounter: Payer: Self-pay | Admitting: *Deleted

## 2014-02-09 ENCOUNTER — Encounter: Payer: Self-pay | Admitting: Family Medicine

## 2014-02-09 ENCOUNTER — Ambulatory Visit (INDEPENDENT_AMBULATORY_CARE_PROVIDER_SITE_OTHER): Payer: BC Managed Care – PPO | Admitting: Family Medicine

## 2014-02-09 VITALS — BP 128/70 | HR 78 | Temp 97.8°F | Resp 16 | Ht 67.0 in | Wt 182.0 lb

## 2014-02-09 DIAGNOSIS — H109 Unspecified conjunctivitis: Secondary | ICD-10-CM | POA: Diagnosis not present

## 2014-02-09 MED ORDER — POLYMYXIN B-TRIMETHOPRIM 10000-0.1 UNIT/ML-% OP SOLN: 1.0000 [drp] | Freq: Four times a day (QID) | OPHTHALMIC | Status: DC

## 2014-02-09 NOTE — Progress Notes (Signed)
Patient ID: Jennifer Hays, female   DOB: 01/13/1946, 68 y.o.   MRN: [id number]   Subjective:    Patient ID: Jennifer Hays, female    DOB: 05/09/1945, 68 y.o.   MRN: [id number]  Patient presents for R eye irritation  Patient here with irritation to right eye. For the past week she's had severe itching and irritation of her eyes as well as increased tearing and drainage. She's had mild matting in the morning. She thought was her allergies therefore she's been taking her over-the-counter Melina Modena and Laumb allergy drops as well as a soothing drops which help some. There's been no change in her vision. Yesterday left eye started to become irritated   Review Of Systems:  GEN- denies fatigue, fever, weight loss,weakness, recent illness HEENT-+ eye drainage,  Denies change in vision, nasal discharge, CVS- denies chest pain, palpitations RESP- denies SOB, cough, wheezey Neuro- denies headache, dizziness, syncope, seizure activity       Objective:    BP 128/70 mmHg  Pulse 78  Temp(Src) 97.8 F (36.6 C) (Oral)  Resp 16  Ht 5\' 7"  (1.702 m)  Wt 182 lb (82.555 kg)  BMI 28.50 kg/m2 GEN- NAD, alert and oriented x3 HEENT- PERRL, EOMI, non injected sclera, + injected right  conjunctiva, tearing noted, mild erythema of lower lid, no swelling noted, vision grossly in tact, mild erythema of left lower conjunctiva MMM, oropharynx clear Neck- Supple LAD      Assessment & Plan:      Problem List Items Addressed This Visit    None    Visit Diagnoses    Conjunctivitis, right eye    -  Primary    initially thought more allergy but with increased symptoms and drainage cover with antibiotic drop for 1 week       Note: This dictation was prepared with Dragon dictation along with smaller phrase technology. Any transcriptional errors that result from this process are unintentional.

## 2014-02-09 NOTE — Patient Instructions (Signed)
Use the eye drops as prescribed Okay to use the soothing drop F/U as needed

## 2014-02-22 ENCOUNTER — Other Ambulatory Visit: Payer: Self-pay | Admitting: Family Medicine

## 2014-04-11 ENCOUNTER — Ambulatory Visit (INDEPENDENT_AMBULATORY_CARE_PROVIDER_SITE_OTHER): Payer: BC Managed Care – PPO | Admitting: Family Medicine

## 2014-04-11 ENCOUNTER — Encounter: Payer: Self-pay | Admitting: Family Medicine

## 2014-04-11 VITALS — BP 128/70 | HR 68 | Temp 98.2°F | Resp 14 | Ht 67.0 in | Wt 180.0 lb

## 2014-04-11 DIAGNOSIS — R300 Dysuria: Secondary | ICD-10-CM

## 2014-04-11 DIAGNOSIS — N3000 Acute cystitis without hematuria: Secondary | ICD-10-CM | POA: Diagnosis not present

## 2014-04-11 DIAGNOSIS — N898 Other specified noninflammatory disorders of vagina: Secondary | ICD-10-CM

## 2014-04-11 LAB — URINALYSIS, MICROSCOPIC ONLY
BACTERIA UA: NONE SEEN
CASTS: NONE SEEN
Crystals: NONE SEEN
RBC / HPF: NONE SEEN RBC/hpf (ref <3)

## 2014-04-11 LAB — WET PREP FOR TRICH, YEAST, CLUE
Clue Cells Wet Prep HPF POC: NONE SEEN
Trich, Wet Prep: NONE SEEN
Yeast Wet Prep HPF POC: NONE SEEN

## 2014-04-11 LAB — URINALYSIS, ROUTINE W REFLEX MICROSCOPIC
BILIRUBIN URINE: NEGATIVE
GLUCOSE, UA: NEGATIVE mg/dL
Hgb urine dipstick: NEGATIVE
KETONES UR: NEGATIVE mg/dL
Nitrite: NEGATIVE
PROTEIN: NEGATIVE mg/dL
SPECIFIC GRAVITY, URINE: 1.01 (ref 1.005–1.030)
Urobilinogen, UA: 0.2 mg/dL (ref 0.0–1.0)
pH: 6 (ref 5.0–8.0)

## 2014-04-11 MED ORDER — CIPROFLOXACIN HCL 500 MG PO TABS: 500.0000 mg | ORAL_TABLET | Freq: Two times a day (BID) | ORAL | Status: DC

## 2014-04-11 MED ORDER — FLUCONAZOLE 150 MG PO TABS: 150.0000 mg | ORAL_TABLET | Freq: Once | ORAL | Status: DC

## 2014-04-11 NOTE — Patient Instructions (Signed)
Do not take your muscle relaxer with cipro Take the diflucan at end of the antibiotics Drink fluids, bland foods for next few days Schedule an eye doctor F/U as needed

## 2014-04-11 NOTE — Progress Notes (Signed)
Patient ID: Jennifer Hays, female   DOB: 1945-04-23, 69 y.o.   MRN: [id number]   Subjective:    Patient ID: Jennifer Hays, female    DOB: August 12, 1945, 69 y.o.   MRN: [id number]  Patient presents for Illness  Pt here with vaginal discharge for past 5 days, +odor to discharge and urine. Used monistat x 1 day. Has also had concurrent lower abdominal pain and epigastric pain, mild nausea, no fever, no emesis. + constipation afew days now resolved.  No OTC medications taken  No vaginal bleeding    Review Of Systems: per above  GEN- denies fatigue, fever, weight loss,weakness, recent illness HEENT- denies eye drainage, change in vision, nasal discharge, CVS- denies chest pain, palpitations RESP- denies SOB, cough, wheeze ABD- denies N/V, change in stools, +abd pain GU- denies dysuria, hematuria, dribbling, incontinence MSK- denies joint pain, muscle aches, injury Neuro- denies headache, dizziness, syncope, seizure activity       Objective:    BP 128/70 mmHg  Pulse 68  Temp(Src) 98.2 F (36.8 C) (Oral)  Resp 14  Ht 5\' 7"  (1.702 m)  Wt 180 lb (81.647 kg)  BMI 28.19 kg/m2 GEN- NAD, alert and oriented x3 HEENT- PERRL, EOMI, non injected sclera, pink conjunctiva, MMM, oropharynx clear CVS- RRR, no murmur RESP-CTAB ABD-NABS,soft,TTP suprapubic region, no CVA tenderness,  GU- normal external genitalia erythema of labia minora vaginal mucosa pink and moist, s/p hysterectomy+ discharge, , no ovarian masses, mild bladder prolapse          Assessment & Plan:      Problem List Items Addressed This Visit    None    Visit Diagnoses    Dysuria    -  Primary    Relevant Orders    Urinalysis, Routine w reflex microscopic (Completed)    Vaginal discharge        No sign of overwhelming infection treat acute cystitis    Relevant Orders    WET PREP FOR [place], YEAST, CLUE (Completed)    Acute cystitis without hematuria        uncomplicated UTI, cipro x 3 days, can take tums for any  epigastric discomfort, use diflucan at end of antibiotics       Note: This dictation was prepared with Dragon dictation along with smaller phrase technology. Any transcriptional errors that result from this process are unintentional.

## 2014-05-21 ENCOUNTER — Other Ambulatory Visit: Payer: Self-pay | Admitting: Family Medicine

## 2014-05-23 NOTE — Telephone Encounter (Signed)
Refill appropriate and filled per protocol. 

## 2014-08-15 ENCOUNTER — Other Ambulatory Visit: Payer: Self-pay | Admitting: Family Medicine

## 2014-08-15 NOTE — Telephone Encounter (Signed)
Refill appropriate and filled per protocol. 

## 2014-11-04 ENCOUNTER — Other Ambulatory Visit: Payer: Self-pay | Admitting: Family Medicine

## 2014-11-04 ENCOUNTER — Encounter: Payer: Self-pay | Admitting: Family Medicine

## 2014-11-04 NOTE — Telephone Encounter (Signed)
Medication refill for one time only.  Patient needs to be seen.  Letter sent for patient to call and schedule 

## 2014-11-14 ENCOUNTER — Ambulatory Visit (INDEPENDENT_AMBULATORY_CARE_PROVIDER_SITE_OTHER): Payer: Medicare Other | Admitting: Family Medicine

## 2014-11-14 ENCOUNTER — Encounter: Payer: Self-pay | Admitting: Family Medicine

## 2014-11-14 VITALS — BP 130/72 | HR 68 | Temp 98.4°F | Resp 14 | Ht 67.0 in | Wt 184.0 lb

## 2014-11-14 DIAGNOSIS — N760 Acute vaginitis: Secondary | ICD-10-CM | POA: Diagnosis not present

## 2014-11-14 DIAGNOSIS — R8290 Unspecified abnormal findings in urine: Secondary | ICD-10-CM | POA: Diagnosis not present

## 2014-11-14 DIAGNOSIS — Z23 Encounter for immunization: Secondary | ICD-10-CM

## 2014-11-14 LAB — URINALYSIS, MICROSCOPIC ONLY
CRYSTALS: NONE SEEN [HPF]
Casts: NONE SEEN [LPF]
RBC / HPF: NONE SEEN RBC/HPF (ref <=2)
Yeast: NONE SEEN [HPF]

## 2014-11-14 LAB — URINALYSIS, ROUTINE W REFLEX MICROSCOPIC
BILIRUBIN URINE: NEGATIVE
GLUCOSE, UA: NEGATIVE
Hgb urine dipstick: NEGATIVE
Ketones, ur: NEGATIVE
Nitrite: NEGATIVE
PROTEIN: NEGATIVE
Specific Gravity, Urine: 1.02 (ref 1.001–1.035)
pH: 7 (ref 5.0–8.0)

## 2014-11-14 LAB — WET PREP FOR TRICH, YEAST, CLUE
Clue Cells Wet Prep HPF POC: NONE SEEN
Trich, Wet Prep: NONE SEEN
Yeast Wet Prep HPF POC: NONE SEEN

## 2014-11-14 MED ORDER — METRONIDAZOLE 0.75 % VA GEL: 1.0000 | Freq: Every day | VAGINAL | Status: DC

## 2014-11-14 NOTE — Progress Notes (Signed)
Patient ID: Jennifer Hays, female   DOB: 05-24-1945, 69 y.o.   MRN: [id number]   Subjective:    Patient ID: Jennifer Hays, female    DOB: 1945/05/17, 69 y.o.   MRN: [id number]  Patient presents for Vaginal Irritation and Dysuria patient here with vaginal irritation mostly at the end point is for the past couple weeks. She is also had some vaginal discharge. She initially used Monistat then used some type of douche which helped temporarily but now her symptoms have returned. She had some odor to her urine but has not had any dysuria or urinary pressure. She denies any vaginal bleeding. No significant abdominal pain fever nausea vomiting.    Review Of Systems: per above   GEN- denies fatigue, fever, weight loss,weakness, recent illness HEENT- denies eye drainage, change in vision, nasal discharge, CVS- denies chest pain, palpitations RESP- denies SOB, cough, wheeze ABD- denies N/V, change in stools, abd pain GU- denies dysuria, hematuria, dribbling, incontinence MSK- denies joint pain, muscle aches, injury Neuro- denies headache, dizziness, syncope, seizure activity       Objective:    BP 130/72 mmHg  Pulse 68  Temp(Src) 98.4 F (36.9 C) (Oral)  Resp 14  Ht 5\' 7"  (1.702 m)  Wt 184 lb (83.462 kg)  BMI 28.81 kg/m2 GEN- NAD, alert and oriented x3 ABD-NABS,soft,NT,ND GEN- NAD, alert and oriented, GU- normal external genitalia, vaginal mucosa pink   Irritation at intrioutus, vaginal atrophy noted cervix visualized no growth,uterus absent minimal thin clear discharge, no CMT, no ovarian masses,  EXT- No edema Pulses- Radial, DP- 2+        Assessment & Plan:      Problem List Items Addressed This Visit    None    Visit Diagnoses    Foul smelling urine    -  Primary     UA shows no sign of significant infection and pt assymptomatic from urinary standpoint    Relevant Orders    Urinalysis, Routine w reflex microscopic (not at [facility]) (Completed)    Vaginitis and vulvovaginitis         Wet prep, mild bacteria, ? if cleared a BV with some of the douching, atrophic vaginitis also playing a role, try of Metrogel    Relevant Orders    WET PREP FOR [place], YEAST, CLUE (Completed)    Need for prophylactic vaccination and inoculation against influenza        Relevant Orders    Flu Vaccine QUAD 36+ mos IM (Fluarix & Fluzone Quad PF (Completed)       Note: This dictation was prepared with Dragon dictation along with smaller Company secretary. Any transcriptional errors that result from this process are unintentional.

## 2014-12-19 ENCOUNTER — Ambulatory Visit (INDEPENDENT_AMBULATORY_CARE_PROVIDER_SITE_OTHER): Payer: Medicare Other | Admitting: Family Medicine

## 2014-12-19 ENCOUNTER — Encounter: Payer: Self-pay | Admitting: Family Medicine

## 2014-12-19 VITALS — BP 128/72 | HR 70 | Temp 97.9°F | Resp 18 | Ht 67.0 in | Wt 181.0 lb

## 2014-12-19 DIAGNOSIS — J4521 Mild intermittent asthma with (acute) exacerbation: Secondary | ICD-10-CM

## 2014-12-19 MED ORDER — ALBUTEROL SULFATE (2.5 MG/3ML) 0.083% IN NEBU
2.5000 mg | INHALATION_SOLUTION | Freq: Once | RESPIRATORY_TRACT | Status: AC
  Administered 2014-12-19: 2.5 mg via RESPIRATORY_TRACT

## 2014-12-19 MED ORDER — ALBUTEROL SULFATE HFA 108 (90 BASE) MCG/ACT IN AERS: 2.0000 | INHALATION_SPRAY | RESPIRATORY_TRACT | Status: DC | PRN

## 2014-12-19 MED ORDER — PREDNISONE 20 MG PO TABS: 40.0000 mg | ORAL_TABLET | Freq: Every day | ORAL | Status: DC

## 2014-12-19 MED ORDER — ALBUTEROL SULFATE (2.5 MG/3ML) 0.083% IN NEBU: 2.5000 mg | INHALATION_SOLUTION | Freq: Four times a day (QID) | RESPIRATORY_TRACT | Status: DC | PRN

## 2014-12-19 MED ORDER — FENOFIBRATE 54 MG PO TABS: 54.0000 mg | ORAL_TABLET | Freq: Every day | ORAL | Status: DC

## 2014-12-19 MED ORDER — FLUTICASONE-SALMETEROL 250-50 MCG/DOSE IN AEPB: INHALATION_SPRAY | RESPIRATORY_TRACT | Status: DC

## 2014-12-19 NOTE — Patient Instructions (Signed)
F/U as previous Take steroids, use albuterol

## 2014-12-19 NOTE — Progress Notes (Signed)
Patient ID: Jennifer Hays, female   DOB: Jul 15, 1945, 69 y.o.   MRN: [id number]   Subjective:    Patient ID: Jennifer Hays, female    DOB: Jan 01, 1946, 69 y.o.   MRN: [id number]  Patient presents for Asthma Exacerbation  Pt here with cough , wheezing,. SOB for past 3 days, feels like her typical asthma. Cough non productive, no fever, but had chills. Her albuterol is out of date, but has been using her advair. Symptoms worse at night.     Review Of Systems:  GEN- denies fatigue, fever, weight loss,weakness, recent illness HEENT- denies eye drainage, change in vision, nasal discharge, CVS- denies chest pain, palpitations RESP- +SOB, +cough, +wheeze ABD- denies N/V, change in stools, abd pain Neuro- denies headache, dizziness, syncope, seizure activity       Objective:    BP 128/72 mmHg  Pulse 70  Temp(Src) 97.9 F (36.6 C) (Oral)  Resp 18  Ht 5\' 7"  (1.702 m)  Wt 181 lb (82.101 kg)  BMI 28.34 kg/m2  SpO2 91% GEN- NAD, alert and oriented x3 HEENT- PERRL, EOMI, non injected sclera, pink conjunctiva, MMM, oropharynx clear Neck- Supple,no LAD CVS- RRR, no murmur RESP-few expiratory wheeze, no rales, no rhonchi, normal WOB, sat 91%, no retractions  Pulses- Radial - 2+   s/p neb- 98% RA       Assessment & Plan:      Problem List Items Addressed This Visit    None    Visit Diagnoses    Asthma with exacerbation, mild intermittent    -  Primary    S/P albuterol neb, good WOB, will give prednisone burst, continue nebulizer every 4 hours as needed, albuterol inhaler also given, continue adavir,.    Relevant Medications    Fluticasone-Salmeterol (ADVAIR DISKUS) 250-50 MCG/DOSE AEPB    predniSONE (DELTASONE) 20 MG tablet    albuterol (PROVENTIL) (2.5 MG/3ML) 0.083% nebulizer solution    albuterol (PROVENTIL) (2.5 MG/3ML) 0.083% nebulizer solution 2.5 mg (Start on 12/19/2014 11:00 AM)       Note: This dictation was prepared with Dragon dictation along with smaller phrase  technology. Any transcriptional errors that result from this process are unintentional.

## 2014-12-20 ENCOUNTER — Telehealth: Payer: Self-pay | Admitting: *Deleted

## 2014-12-20 ENCOUNTER — Other Ambulatory Visit: Payer: Self-pay | Admitting: Family Medicine

## 2014-12-20 DIAGNOSIS — E2839 Other primary ovarian failure: Secondary | ICD-10-CM

## 2014-12-20 DIAGNOSIS — M810 Age-related osteoporosis without current pathological fracture: Secondary | ICD-10-CM

## 2014-12-20 DIAGNOSIS — Z1231 Encounter for screening mammogram for malignant neoplasm of breast: Secondary | ICD-10-CM

## 2014-12-20 NOTE — Telephone Encounter (Signed)
Pt called stating she made appt to have both bone density and mammogram done and Forestine Na advised her needs to call office and place orders, I placed orders for both and pt aware needs to call and schedule appt

## 2014-12-21 ENCOUNTER — Other Ambulatory Visit: Payer: Self-pay | Admitting: Family Medicine

## 2014-12-21 ENCOUNTER — Ambulatory Visit (HOSPITAL_COMMUNITY)
Admission: RE | Admit: 2014-12-21 | Discharge: 2014-12-21 | Disposition: A | Discharge status: Home / Self Care | Payer: BC Managed Care – PPO | Source: Ambulatory Visit | Attending: Family Medicine | Admitting: Family Medicine

## 2014-12-21 ENCOUNTER — Telehealth: Payer: Self-pay | Admitting: Family Medicine

## 2014-12-21 DIAGNOSIS — M85862 Other specified disorders of bone density and structure, left lower leg: Secondary | ICD-10-CM | POA: Insufficient documentation

## 2014-12-21 DIAGNOSIS — Z780 Asymptomatic menopausal state: Secondary | ICD-10-CM | POA: Diagnosis not present

## 2014-12-21 DIAGNOSIS — M810 Age-related osteoporosis without current pathological fracture: Secondary | ICD-10-CM

## 2014-12-21 DIAGNOSIS — Z1231 Encounter for screening mammogram for malignant neoplasm of breast: Secondary | ICD-10-CM

## 2014-12-21 DIAGNOSIS — M8580 Other specified disorders of bone density and structure, unspecified site: Secondary | ICD-10-CM | POA: Diagnosis not present

## 2014-12-21 DIAGNOSIS — M85852 Other specified disorders of bone density and structure, left thigh: Secondary | ICD-10-CM | POA: Diagnosis not present

## 2014-12-21 DIAGNOSIS — E559 Vitamin D deficiency, unspecified: Secondary | ICD-10-CM | POA: Insufficient documentation

## 2014-12-21 DIAGNOSIS — M85832 Other specified disorders of bone density and structure, left forearm: Secondary | ICD-10-CM | POA: Diagnosis not present

## 2014-12-21 DIAGNOSIS — E2839 Other primary ovarian failure: Secondary | ICD-10-CM

## 2014-12-21 NOTE — Telephone Encounter (Signed)
Call placed to patient.   Requested clarification on medication. Discussed all relevant medication orders.

## 2014-12-21 NOTE — Telephone Encounter (Signed)
Patient calling to discuss her asthma  [phone]

## 2014-12-26 ENCOUNTER — Other Ambulatory Visit: Payer: Self-pay | Admitting: *Deleted

## 2014-12-26 MED ORDER — ALENDRONATE SODIUM 70 MG PO TABS: 70.0000 mg | ORAL_TABLET | ORAL | Status: DC

## 2015-02-13 ENCOUNTER — Encounter: Payer: Self-pay | Admitting: Family Medicine

## 2015-02-13 ENCOUNTER — Ambulatory Visit (INDEPENDENT_AMBULATORY_CARE_PROVIDER_SITE_OTHER): Payer: BC Managed Care – PPO | Admitting: Family Medicine

## 2015-02-13 VITALS — BP 118/64 | HR 72 | Temp 97.8°F | Resp 18 | Ht 67.0 in | Wt 181.0 lb

## 2015-02-13 DIAGNOSIS — L309 Dermatitis, unspecified: Secondary | ICD-10-CM | POA: Diagnosis not present

## 2015-02-13 DIAGNOSIS — B379 Candidiasis, unspecified: Secondary | ICD-10-CM | POA: Diagnosis not present

## 2015-02-13 MED ORDER — CLOBETASOL PROPIONATE 0.05 % EX CREA: 1.0000 "application " | TOPICAL_CREAM | Freq: Two times a day (BID) | CUTANEOUS | Status: DC

## 2015-02-13 MED ORDER — FLUCONAZOLE 150 MG PO TABS: 150.0000 mg | ORAL_TABLET | Freq: Once | ORAL | Status: DC

## 2015-02-13 NOTE — Progress Notes (Signed)
Patient ID: MIKENZIE Hays, female   DOB: [birth year], 69 y.o.   MRN: [id number]   Subjective:    Patient ID: Jennifer Hays, female    DOB: 1945-08-10, 69 y.o.   MRN: [id number]  Patient presents for Skin Irritation  Had back surgery a few weeks ago, had allergic reaction to either the iodine or tape applied to her lower back. Now has itching and irritation around her rectal area, declines any issues with her hemorroids. Also has itchy rash on left side of neck for past 3 days. Used tea tree oil , neosporin and tea tree oil with minimal improvement.  She notes that she had yeast infection last week and used monistat which helped     Review Of Systems:  GEN- denies fatigue, fever, weight loss,weakness, recent illness HEENT- denies eye drainage, change in vision, nasal discharge, CVS- denies chest pain, palpitations RESP- denies SOB, cough, wheeze ABD- denies N/V, change in stools, abd pain GU- denies dysuria, hematuria, dribbling, incontinence MSK- + joint pain, muscle aches, injury Neuro- denies headache, dizziness, syncope, seizure activity       Objective:    BP 118/64 mmHg  Pulse 72  Temp(Src) 97.8 F (36.6 C) (Oral)  Resp 18  Ht 5\' 7"  (1.702 m)  Wt 181 lb (82.101 kg)  BMI 28.34 kg/m2 GEN- NAD, alert and oriented x3 HEENT- PERRL, EOMI, non injected sclera, pink conjunctiva, MMM, oropharynx clear Neck- Supple, no thyromegaly CVS- RRR, no murmur RESP-CTAB Skin- Left anterior Neck- erythematous scaley patch no papules, no pustles no vesicles  2 oclock position perirectal erythema and irritation  Previous tape irritation above sacrum noted         Assessment & Plan:      Problem List Items Addressed This Visit    None    Visit Diagnoses    Dermatitis    -  Primary    More dermatitis, looks like contact but since she had vaginal yeast infection will cover with Lotrisone BID, can stop neosporin, also given diflucan tablet    Yeast infection        Relevant Medications     fluconazole (DIFLUCAN) 150 MG tablet       Note: This dictation was prepared with Dragon dictation along with smaller phrase technology. Any transcriptional errors that result from this process are unintentional.

## 2015-02-13 NOTE — Patient Instructions (Signed)
Apply cream twice a day Diflucan tablet sent in for yeast infection Schedule PHYSICAL for Jan

## 2015-03-03 ENCOUNTER — Telehealth: Payer: Self-pay | Admitting: Family Medicine

## 2015-03-03 NOTE — Telephone Encounter (Signed)
Call placed to patient to inquire.   States that she has just had surgery and is on narcotics for pain management. Reports that she is having difficulty with constipation.   Requested MD to Advise if enema could be used.   Recommended to begin with OTC stool softeners like colace or senna, and then to move to Miralax if no relief. Advised that if patient continues to have no relief after 48 hours, enema can then be given.

## 2015-03-03 NOTE — Telephone Encounter (Signed)
Patient is calling to ask a question about constipation and and enema  [phone]

## 2015-03-08 ENCOUNTER — Ambulatory Visit (INDEPENDENT_AMBULATORY_CARE_PROVIDER_SITE_OTHER): Payer: BC Managed Care – PPO | Admitting: Family Medicine

## 2015-03-08 ENCOUNTER — Ambulatory Visit (HOSPITAL_COMMUNITY)
Admission: RE | Admit: 2015-03-08 | Discharge: 2015-03-08 | Disposition: A | Discharge status: Home / Self Care | Payer: BC Managed Care – PPO | Source: Ambulatory Visit | Attending: Family Medicine | Admitting: Family Medicine

## 2015-03-08 ENCOUNTER — Encounter: Payer: Self-pay | Admitting: Family Medicine

## 2015-03-08 VITALS — BP 128/78 | HR 72 | Temp 97.2°F | Resp 14 | Ht 67.0 in | Wt 180.0 lb

## 2015-03-08 DIAGNOSIS — M25551 Pain in right hip: Secondary | ICD-10-CM

## 2015-03-08 DIAGNOSIS — M545 Low back pain, unspecified: Secondary | ICD-10-CM

## 2015-03-08 DIAGNOSIS — R8290 Unspecified abnormal findings in urine: Secondary | ICD-10-CM | POA: Diagnosis not present

## 2015-03-08 DIAGNOSIS — M810 Age-related osteoporosis without current pathological fracture: Secondary | ICD-10-CM

## 2015-03-08 DIAGNOSIS — Z0000 Encounter for general adult medical examination without abnormal findings: Secondary | ICD-10-CM | POA: Diagnosis not present

## 2015-03-08 DIAGNOSIS — M797 Fibromyalgia: Secondary | ICD-10-CM | POA: Diagnosis not present

## 2015-03-08 DIAGNOSIS — E781 Pure hyperglyceridemia: Secondary | ICD-10-CM | POA: Diagnosis not present

## 2015-03-08 DIAGNOSIS — M4186 Other forms of scoliosis, lumbar region: Secondary | ICD-10-CM | POA: Diagnosis not present

## 2015-03-08 DIAGNOSIS — N390 Urinary tract infection, site not specified: Secondary | ICD-10-CM

## 2015-03-08 DIAGNOSIS — Z1159 Encounter for screening for other viral diseases: Secondary | ICD-10-CM | POA: Diagnosis not present

## 2015-03-08 DIAGNOSIS — M25552 Pain in left hip: Secondary | ICD-10-CM | POA: Diagnosis not present

## 2015-03-08 DIAGNOSIS — W19XXXA Unspecified fall, initial encounter: Secondary | ICD-10-CM | POA: Insufficient documentation

## 2015-03-08 DIAGNOSIS — J4520 Mild intermittent asthma, uncomplicated: Secondary | ICD-10-CM

## 2015-03-08 LAB — CBC WITH DIFFERENTIAL/PLATELET
BASOS ABS: 0 10*3/uL (ref 0.0–0.1)
BASOS PCT: 0 % (ref 0–1)
EOS ABS: 0.1 10*3/uL (ref 0.0–0.7)
Eosinophils Relative: 2 % (ref 0–5)
HCT: 40.9 % (ref 36.0–46.0)
Hemoglobin: 13.7 g/dL (ref 12.0–15.0)
Lymphocytes Relative: 26 % (ref 12–46)
Lymphs Abs: 1.6 10*3/uL (ref 0.7–4.0)
MCH: 29.5 pg (ref 26.0–34.0)
MCHC: 33.5 g/dL (ref 30.0–36.0)
MCV: 88.1 fL (ref 78.0–100.0)
MPV: 11.8 fL (ref 8.6–12.4)
Monocytes Absolute: 0.5 10*3/uL (ref 0.1–1.0)
Monocytes Relative: 9 % (ref 3–12)
Neutro Abs: 3.8 10*3/uL (ref 1.7–7.7)
Neutrophils Relative %: 63 % (ref 43–77)
PLATELETS: 252 10*3/uL (ref 150–400)
RBC: 4.64 MIL/uL (ref 3.87–5.11)
RDW: 13.5 % (ref 11.5–15.5)
WBC: 6 10*3/uL (ref 4.0–10.5)

## 2015-03-08 LAB — URINALYSIS, MICROSCOPIC ONLY
CASTS: NONE SEEN [LPF]
Crystals: NONE SEEN [HPF]
YEAST: NONE SEEN [HPF]

## 2015-03-08 LAB — LIPID PANEL
CHOL/HDL RATIO: 4.2 ratio (ref <=5.0)
Cholesterol: 206 mg/dL — ABNORMAL HIGH (ref 125–200)
HDL: 49 mg/dL (ref >=46)
LDL Cholesterol: 122 mg/dL (ref <130)
Triglycerides: 173 mg/dL — ABNORMAL HIGH (ref <150)
VLDL: 35 mg/dL — AB (ref <30)

## 2015-03-08 LAB — URINALYSIS, ROUTINE W REFLEX MICROSCOPIC
BILIRUBIN URINE: NEGATIVE
GLUCOSE, UA: NEGATIVE
HGB URINE DIPSTICK: NEGATIVE
KETONES UR: NEGATIVE
Nitrite: NEGATIVE
PROTEIN: NEGATIVE
Specific Gravity, Urine: >1.03 (ref 1.001–1.035)
pH: 5.5 (ref 5.0–8.0)

## 2015-03-08 LAB — COMPREHENSIVE METABOLIC PANEL
ALT: 29 U/L (ref 6–29)
AST: 23 U/L (ref 10–35)
Albumin: 4.2 g/dL (ref 3.6–5.1)
Alkaline Phosphatase: 76 U/L (ref 33–130)
BUN: 11 mg/dL (ref 7–25)
CHLORIDE: 107 mmol/L (ref 98–110)
CO2: 23 mmol/L (ref 20–31)
Calcium: 9.5 mg/dL (ref 8.6–10.4)
Creat: 0.69 mg/dL (ref 0.50–0.99)
GLUCOSE: 86 mg/dL (ref 70–99)
POTASSIUM: 4 mmol/L (ref 3.5–5.3)
Sodium: 141 mmol/L (ref 135–146)
Total Bilirubin: 0.6 mg/dL (ref 0.2–1.2)
Total Protein: 6.7 g/dL (ref 6.1–8.1)

## 2015-03-08 MED ORDER — CEPHALEXIN 500 MG PO CAPS: 500.0000 mg | ORAL_CAPSULE | Freq: Four times a day (QID) | ORAL | Status: DC

## 2015-03-08 NOTE — Assessment & Plan Note (Signed)
Treat with keflex x 5 days

## 2015-03-08 NOTE — Assessment & Plan Note (Signed)
Recently started on Fosamax, on Vitamin D High risk for fracture, with recent fall obtain Xray of hip and L spine, she has pain meds

## 2015-03-08 NOTE — Patient Instructions (Addendum)
Continue current medications We will call with labs I recommend eye visit once a year I recommend dental visit every 6 months Goal is to  Exercise 30 minutes 5 days a week Stool cards Get the xrays done on your hip and back  F/U 6 months

## 2015-03-08 NOTE — Assessment & Plan Note (Signed)
Doing well on meds no changes immunizations UTD

## 2015-03-08 NOTE — Progress Notes (Signed)
Patient ID: Jennifer Hays, female   DOB: Jan 30, 1946, 70 y.o.   MRN: [id number] Subjective:   Patient presents for Medicare Annual/Subsequent preventive examination.     - Pt here for annual wellness exam. She fell on Christmas day trying to get out of bed, her leg was weak and she was wedged between bed and side table. She has large bruise to her right lower leg and on her thigh, but she has had increased hip pain and low back pain. She has a orthopedist- Dr.Bartko that did recent back surgery  but they could not see her until next week.   - Meds reviewed   - past few days had has odor to urine, not sure if UTI or recurrent yeast infection   Review Past Medical/Family/Social: per EMR    Risk Factors  Current exercise habits: none Dietary issues discussed: yes  Cardiac risk factors: Obesity (BMI >= 30 kg/m2). ,High Triglycerdies   Depression Screen  (Note: if answer to either of the following is "Yes", a more complete depression screening is indicated)  Over the past two weeks, have you felt down, depressed or hopeless? No Over the past two weeks, have you felt little interest or pleasure in doing things? No Have you lost interest or pleasure in daily life? No Do you often feel hopeless? No Do you cry easily over simple problems? No   Activities of Daily Living  In your present state of health, do you have any difficulty performing the following activities?:  Driving? No  Managing money? No  Feeding yourself? No  Getting from bed to chair? No  Climbing a flight of stairs? No  Preparing food and eating?: No  Bathing or showering? No  Getting dressed: No  Getting to the toilet? No  Using the toilet:No  Moving around from place to place: No  In the past year have you fallen or had a near fall?:Yes Are you sexually active? No  Do you have more than one partner? No   Hearing Difficulties: No  Do you often ask people to speak up or repeat themselves? No  Do you experience ringing  or noises in your ears? No Do you have difficulty understanding soft or whispered voices? No  Do you feel that you have a problem with memory? No Do you often misplace items? No  Do you feel safe at home? Yes  Cognitive Testing  Alert? Yes Normal Appearance?Yes  Oriented to person? Yes Place? Yes  Time? Yes  Recall of three objects? Yes  Can perform simple calculations? Yes  Displays appropriate judgment?Yes  Can read the correct time from a watch face?Yes   List the Names of Other Physician/Practitioners you currently use: Ortho- Dr. Brien Few   Screening Tests / Date Colonoscopy -Declines                    Zostavax Unable  Mammogram UTD Influenza Vaccine UTD Tetanus/tdap UTD Bone Density- UTD  ROS: GEN- denies fatigue, fever, weight loss,weakness, recent illness HEENT- denies eye drainage, change in vision, nasal discharge, CVS- denies chest pain, palpitations RESP- denies SOB, cough, wheeze ABD- denies N/V, change in stools, abd pain GU- denies dysuria, hematuria, dribbling, incontinence MSK- denies joint pain, muscle aches, injury Neuro- denies headache, dizziness, syncope, seizure activity  PHYSICAL: GEN- NAD, alert and oriented x3 HEENT- PERRL, EOMI, non injected sclera, pink conjunctiva, MMM, oropharynx clear Neck- Supple, no thryomegaly CVS- RRR, no murmur RESP-CTAB ABD-NABS,soft,NT,ND MSK- Decreased ROM Spine, mild TTP  lumbar spine, Pain with IR right hip, good ROM left hip, neg SLR  Skin- large bruise on  EXT- No edema Pulses- Radial, DP- 2+    Assessment:    Annual wellness medicare exam   Plan:    During the course of the visit the patient was educated and counseled about appropriate screening and preventive services including:   Colorectal cancer screening - stool cards given  Shingles vaccine. - SHE WILL THINK ABOUT THIS   Screen NEG  for depression. Diet review for nutrition referral? Yes ____ Not Indicated __x__  Patient Instructions (the  written plan) was given to the patient.  Medicare Attestation  I have personally reviewed:  The patient's medical and social history  Their use of alcohol, tobacco or illicit drugs  Their current medications and supplements  The patient's functional ability including ADLs,fall risks, home safety risks, cognitive, and hearing and visual impairment  Diet and physical activities  Evidence for depression or mood disorders  The patient's weight, height, BMI, and visual acuity have been recorded in the chart. I have made referrals, counseling, and provided education to the patient based on review of the above and I have provided the patient with a written personalized care plan for preventive services.

## 2015-03-09 LAB — VITAMIN D 25 HYDROXY (VIT D DEFICIENCY, FRACTURES): VIT D 25 HYDROXY: 29 ng/mL — AB (ref 30–100)

## 2015-03-09 LAB — HEPATITIS C ANTIBODY: HCV AB: NEGATIVE

## 2015-04-10 ENCOUNTER — Other Ambulatory Visit: Payer: Self-pay | Admitting: Family Medicine

## 2015-04-10 NOTE — Telephone Encounter (Signed)
Refill appropriate and filled per protocol. 

## 2015-04-14 ENCOUNTER — Encounter: Payer: Self-pay | Admitting: Family Medicine

## 2015-04-14 ENCOUNTER — Ambulatory Visit (INDEPENDENT_AMBULATORY_CARE_PROVIDER_SITE_OTHER): Payer: BC Managed Care – PPO | Admitting: Family Medicine

## 2015-04-14 VITALS — BP 128/72 | HR 78 | Temp 98.4°F | Resp 14 | Ht 67.0 in | Wt 176.0 lb

## 2015-04-14 DIAGNOSIS — N3000 Acute cystitis without hematuria: Secondary | ICD-10-CM

## 2015-04-14 LAB — URINALYSIS, ROUTINE W REFLEX MICROSCOPIC
BILIRUBIN URINE: NEGATIVE
GLUCOSE, UA: NEGATIVE
Hgb urine dipstick: NEGATIVE
KETONES UR: NEGATIVE
Leukocytes, UA: NEGATIVE
Nitrite: NEGATIVE
Protein, ur: NEGATIVE
pH: 5.5 (ref 5.0–8.0)

## 2015-04-14 MED ORDER — CEPHALEXIN 500 MG PO CAPS: 500.0000 mg | ORAL_CAPSULE | Freq: Four times a day (QID) | ORAL | Status: DC

## 2015-04-14 NOTE — Progress Notes (Signed)
Patient ID: Jennifer Hays, female   DOB: 1946-03-02, 70 y.o.   MRN: [id number]   Subjective:    Patient ID: Jennifer Hays, female    DOB: [birth year], 70 y.o.   MRN: [id number]  Patient presents for Dysuria  Patient here with dysuria for the past week. She has not had a fever. She is significantly after she finishes her urine stream. She denies any blood in here states it is very dark. No change in her bowels. This feels like her typical urinary tract infections. I did treat her with Keflex a month ago  and all her symptoms did clear    Review Of Systems:  GEN- denies fatigue, fever, weight loss,weakness, recent illness HEENT- denies eye drainage, change in vision, nasal discharge, CVS- denies chest pain, palpitations RESP- denies SOB, cough, wheeze ABD- denies N/V, change in stools, abd pain GU- + dysuria,  Denies hematuria, dribbling, incontinence MSK- denies joint pain, muscle aches, injury Neuro- denies headache, dizziness, syncope, seizure activity       Objective:    BP 128/72 mmHg  Pulse 78  Temp(Src) 98.4 F (36.9 C) (Oral)  Resp 14  Ht 5\' 7"  (1.702 m)  Wt 176 lb (79.833 kg)  BMI 27.56 kg/m2 GEN- NAD, alert and oriented x3 CVS- RRR, no murmur RESP-CTAB ABD-NABS,soft,TTP suprapubic region, ND, no CVA tenderness         Assessment & Plan:      Problem List Items Addressed This Visit    None    Visit Diagnoses    Acute cystitis without hematuria    -  Primary    cystitis symptoms, though UA looks good today, start keflex as the weekend, send for culture, push fluids,we may need urology if recurrent issues    Relevant Orders    Urinalysis, Routine w reflex microscopic (not at [facility]) (Completed)    Urine culture       Note: This dictation was prepared with Dragon dictation along with smaller phrase technology. Any transcriptional errors that result from this process are unintentional.

## 2015-04-14 NOTE — Patient Instructions (Signed)
Take antibiotics as prescribed Continue water We will call with culture results  F/U as previous

## 2015-04-15 LAB — URINE CULTURE
Colony Count: NO GROWTH
ORGANISM ID, BACTERIA: NO GROWTH

## 2015-04-19 ENCOUNTER — Telehealth: Payer: Self-pay | Admitting: Family Medicine

## 2015-04-19 DIAGNOSIS — N390 Urinary tract infection, site not specified: Secondary | ICD-10-CM

## 2015-04-19 NOTE — Telephone Encounter (Signed)
Patient is calling to say she was in for bladder infection recently and now she cannot urinate it seems. Would like a call back to see what she should do  [phone] (H)

## 2015-04-19 NOTE — Telephone Encounter (Signed)
Call placed to patient.   States that she has lower abdominal discomfort with urination. States that she is drinking plenty of water and cranberry juice.   Per last notes, patient may need urology consult.   Ok to order?

## 2015-04-19 NOTE — Telephone Encounter (Signed)
Okay to place referral

## 2015-04-19 NOTE — Telephone Encounter (Signed)
Referral orders placed

## 2015-04-20 ENCOUNTER — Ambulatory Visit: Payer: Self-pay | Admitting: Physician Assistant

## 2015-04-21 DIAGNOSIS — R3912 Poor urinary stream: Secondary | ICD-10-CM | POA: Diagnosis not present

## 2015-04-21 DIAGNOSIS — N3020 Other chronic cystitis without hematuria: Secondary | ICD-10-CM | POA: Diagnosis not present

## 2015-05-09 DIAGNOSIS — N3020 Other chronic cystitis without hematuria: Secondary | ICD-10-CM | POA: Diagnosis not present

## 2015-05-10 ENCOUNTER — Other Ambulatory Visit: Payer: Self-pay | Admitting: Family Medicine

## 2015-05-10 MED ORDER — FENOFIBRATE 54 MG PO TABS: ORAL_TABLET | ORAL | Status: DC

## 2015-05-10 NOTE — Telephone Encounter (Signed)
Medication refilled per protocol. 

## 2015-05-15 DIAGNOSIS — N3020 Other chronic cystitis without hematuria: Secondary | ICD-10-CM | POA: Diagnosis not present

## 2015-08-06 ENCOUNTER — Other Ambulatory Visit: Payer: Self-pay | Admitting: Family Medicine

## 2015-08-11 ENCOUNTER — Encounter: Payer: Self-pay | Admitting: Family Medicine

## 2015-08-11 ENCOUNTER — Ambulatory Visit (INDEPENDENT_AMBULATORY_CARE_PROVIDER_SITE_OTHER): Payer: Medicare Other | Admitting: Family Medicine

## 2015-08-11 VITALS — BP 128/74 | HR 68 | Temp 97.9°F | Resp 14 | Ht 67.0 in | Wt 172.0 lb

## 2015-08-11 DIAGNOSIS — M797 Fibromyalgia: Secondary | ICD-10-CM | POA: Diagnosis not present

## 2015-08-11 DIAGNOSIS — J4520 Mild intermittent asthma, uncomplicated: Secondary | ICD-10-CM | POA: Diagnosis not present

## 2015-08-11 DIAGNOSIS — E781 Pure hyperglyceridemia: Secondary | ICD-10-CM

## 2015-08-11 MED ORDER — PREDNISONE 20 MG PO TABS: 40.0000 mg | ORAL_TABLET | Freq: Every day | ORAL | Status: DC

## 2015-08-11 MED ORDER — ALBUTEROL SULFATE (2.5 MG/3ML) 0.083% IN NEBU: 2.5000 mg | INHALATION_SOLUTION | Freq: Four times a day (QID) | RESPIRATORY_TRACT | Status: DC | PRN

## 2015-08-11 MED ORDER — BUDESONIDE 1 MG/2ML IN SUSP: 1.0000 mg | Freq: Every day | RESPIRATORY_TRACT | Status: DC

## 2015-08-11 NOTE — Assessment & Plan Note (Signed)
Continue zanaflex

## 2015-08-11 NOTE — Patient Instructions (Signed)
F/U Jan for Physical  

## 2015-08-11 NOTE — Assessment & Plan Note (Signed)
No evidence of acute exacerbation currently. I am going to give her a prescription of prednisone she feels like her symptoms worsen. Also refilled her nebulizers. Continue Advair

## 2015-08-11 NOTE — Progress Notes (Signed)
Patient ID: Jennifer Hays, female   DOB: 07-24-1945, 70 y.o.   MRN: [id number]   Subjective:    Patient ID: Jennifer Hays, female    DOB: 1945-05-27, 70 y.o.   MRN: [id number]  Patient presents for Medication Review/ Refill  Patient here to follow-up medications. She's been treated for asthma she is currently on Advair and albuterol. She has difficulty she also uses pulmicort nebs She has been at the beach and had a mild asthma exacerbation still feels tight in the chest and she did not have her nebulizer with her. She's has had mild cough but no production   Her myalgias she uses Zanaflex as needed she also uses Lidoderm patches for chronic back pain- Dr. Brien Few  Hyperlipidemia she is still taking TriCor as prescribed her last triglycerides were 173 which was improved  Review Of Systems:  GEN- denies fatigue, fever, weight loss,weakness, recent illness HEENT- denies eye drainage, change in vision, nasal discharge, CVS- denies chest pain, palpitations RESP- denies SOB, cough, wheeze ABD- denies N/V, change in stools, abd pain GU- denies dysuria, hematuria, dribbling, incontinence MSK- + joint pain, muscle aches, injury Neuro- denies headache, dizziness, syncope, seizure activity       Objective:    BP 128/74 mmHg  Pulse 68  Temp(Src) 97.9 F (36.6 C) (Oral)  Resp 14  Ht 5\' 7"  (1.702 m)  Wt 172 lb (78.019 kg)  BMI 26.93 kg/m2 GEN- NAD, alert and oriented x3 HEENT- PERRL, EOMI, non injected sclera, pink conjunctiva, MMM, oropharynx clear Neck- Supple, no thyromegaly CVS- RRR, no murmur RESP-CTAB ABD-NABS,soft,NT,ND EXT- No edema Pulses- Radial 2+        Assessment & Plan:      Problem List Items Addressed This Visit    Intrinsic asthma - Primary    No evidence of acute exacerbation currently. I am going to give her a prescription of prednisone she feels like her symptoms worsen. Also refilled her nebulizers. Continue Advair      Relevant Medications   predniSONE  (DELTASONE) 20 MG tablet   albuterol (PROVENTIL) (2.5 MG/3ML) 0.083% nebulizer solution   budesonide (PULMICORT) 1 MG/2ML nebulizer solution   Hypertriglyceridemia   Fibromyalgia    Continue zanaflex         Note: This dictation was prepared with Dragon dictation along with smaller phrase technology. Any transcriptional errors that result from this process are unintentional.

## 2015-09-12 ENCOUNTER — Ambulatory Visit: Payer: Medicare Other | Admitting: Family Medicine

## 2015-11-27 ENCOUNTER — Other Ambulatory Visit: Payer: Self-pay | Admitting: Family Medicine

## 2015-12-18 ENCOUNTER — Other Ambulatory Visit: Payer: Self-pay | Admitting: *Deleted

## 2015-12-18 MED ORDER — FENOFIBRATE 54 MG PO TABS: ORAL_TABLET | ORAL | 1 refills | Status: DC

## 2015-12-18 NOTE — Telephone Encounter (Signed)
Received fax requesting refill on Fenofibrate.   Refill appropriate and filled per protocol.

## 2016-01-02 ENCOUNTER — Ambulatory Visit (INDEPENDENT_AMBULATORY_CARE_PROVIDER_SITE_OTHER): Payer: Medicare Other | Admitting: Family Medicine

## 2016-01-02 ENCOUNTER — Encounter: Payer: Self-pay | Admitting: Family Medicine

## 2016-01-02 VITALS — BP 104/72 | HR 68 | Temp 97.8°F | Resp 18 | Wt 170.0 lb

## 2016-01-02 DIAGNOSIS — M797 Fibromyalgia: Secondary | ICD-10-CM

## 2016-01-02 DIAGNOSIS — E781 Pure hyperglyceridemia: Secondary | ICD-10-CM

## 2016-01-02 DIAGNOSIS — R51 Headache: Secondary | ICD-10-CM

## 2016-01-02 DIAGNOSIS — J45909 Unspecified asthma, uncomplicated: Secondary | ICD-10-CM | POA: Diagnosis not present

## 2016-01-02 DIAGNOSIS — R519 Headache, unspecified: Secondary | ICD-10-CM

## 2016-01-02 NOTE — Progress Notes (Signed)
Subjective:    Patient ID: Jennifer Hays, female    DOB: Jul 20, 1945, 70 y.o.   MRN: [id number]  Patient presents for had flu shot now problems (had geriatric flu shot now has constant persisitant ha,  discuss fenofibrate for cholesterol) Patient here to follow-up chronic medical problems. Medications reviewed.  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 She also has had runny nose mild headache which she thinks may be due to the [place]. She states when she takes it every other day her headache decreases. She does have underlying asthma and allergies physical breathing has been stable. She has not tried her Zyrtec for the runny nose or head congestion/headache.  Also note that she had radiofrequency done for her hip by her pain doctor which also makes her ache this was done on October 4 Vegan    Review Of Systems:  GEN- denies fatigue, fever, weight loss,weakness, recent illness HEENT- denies eye drainage, change in vision, +nasal discharge, CVS- denies chest pain, palpitations RESP- denies SOB, cough, wheeze ABD- denies N/V, change in stools, abd pain GU- denies dysuria, hematuria, dribbling, incontinence MSK- denies joint pain, +muscle aches, injury Neuro- + headache, dizziness, syncope, seizure activity       Objective:    BP 104/72 (BP Location: Right Arm, Patient Position: Sitting, Cuff Size: Normal)   Pulse 68   Temp 97.8 F (36.6 C) (Oral)   Resp 18   Wt 170 lb (77.1 kg)   BMI 26.63 kg/m  GEN- NAD, alert and oriented x3,well appearing  HEENT- PERRL, EOMI, non injected sclera, pink conjunctiva, MMM, oropharynx clear, clear rhinorrhea, no maxillary sinus tenderness  Neck- Supple, CVS- RRR, no  murmur RESP-CTAB Neuro- CNII-XII intact, no deficits  Flu injection site, clear, no nodules  EXT- No edema Pulses- Radial 2+        Assessment & Plan:      Problem List Items Addressed This Visit    Intrinsic asthma    Currently controlled she had flu shot which is likely causing the myalgias in her arms but otherwise no sign of any reaction to the flu shot      Hypertriglyceridemia - Primary    Recheck triglycerides. TriCor for now to see how she does she's on a vegan diet      Relevant Orders   CBC with Differential/Platelet (Completed)   Comprehensive metabolic panel (Completed)   Lipid panel (Completed)   Fibromyalgia    I think her pains are most likely from her fibromyalgia. The runny nose and headache isn't quite typical of the fenofibrate however she was a trial off of this. She's been on it for few years. We'll give her a trial off. She will continue her fibromyalgia medications      Relevant Orders   CK (Completed)    Other Visit Diagnoses    Acute nonintractable headache, unspecified headache type       I think that this is most likely from some allergies with a runny nose drainage also her fiber. We'll have her use Zyrtec along with a nasal rinse no red flags      Note: This dictation was prepared with Dragon dictation along with smaller phrase technology. Any transcriptional  errors that result from this process are unintentional.

## 2016-01-02 NOTE — Patient Instructions (Addendum)
Use zyrtec for a few days Along with the nasal rinse Stop the tricor  We will call with lab results  F/U FOR PHYSICAL

## 2016-01-03 LAB — CBC WITH DIFFERENTIAL/PLATELET
BASOS ABS: 41 {cells}/uL (ref 0–200)
BASOS PCT: 1 %
EOS ABS: 123 {cells}/uL (ref 15–500)
Eosinophils Relative: 3 %
HEMATOCRIT: 40.4 % (ref 35.0–45.0)
Hemoglobin: 13.2 g/dL (ref 12.0–15.0)
LYMPHS PCT: 36 %
Lymphs Abs: 1476 cells/uL (ref 850–3900)
MCH: 28.5 pg (ref 27.0–33.0)
MCHC: 32.7 g/dL (ref 32.0–36.0)
MCV: 87.3 fL (ref 80.0–100.0)
MONO ABS: 410 {cells}/uL (ref 200–950)
MPV: 12.4 fL (ref 7.5–12.5)
Monocytes Relative: 10 %
NEUTROS PCT: 50 %
Neutro Abs: 2050 cells/uL (ref 1500–7800)
Platelets: 221 10*3/uL (ref 140–400)
RBC: 4.63 MIL/uL (ref 3.80–5.10)
RDW: 14.8 % (ref 11.0–15.0)
WBC: 4.1 10*3/uL (ref 3.8–10.8)

## 2016-01-03 LAB — COMPREHENSIVE METABOLIC PANEL
ALK PHOS: 55 U/L (ref 33–130)
ALT: 26 U/L (ref 6–29)
AST: 29 U/L (ref 10–35)
Albumin: 4.1 g/dL (ref 3.6–5.1)
BUN: 8 mg/dL (ref 7–25)
CALCIUM: 9.4 mg/dL (ref 8.6–10.4)
CO2: 24 mmol/L (ref 20–31)
Chloride: 105 mmol/L (ref 98–110)
Creat: 0.72 mg/dL (ref 0.50–0.99)
GLUCOSE: 92 mg/dL (ref 70–99)
POTASSIUM: 4.4 mmol/L (ref 3.5–5.3)
Sodium: 139 mmol/L (ref 135–146)
Total Bilirubin: 0.6 mg/dL (ref 0.2–1.2)
Total Protein: 6.5 g/dL (ref 6.1–8.1)

## 2016-01-03 LAB — LIPID PANEL
CHOL/HDL RATIO: 4.1 ratio (ref <=5.0)
Cholesterol: 235 mg/dL — ABNORMAL HIGH (ref 125–200)
HDL: 57 mg/dL (ref >=46)
LDL Cholesterol: 150 mg/dL — ABNORMAL HIGH (ref <130)
Triglycerides: 142 mg/dL (ref <150)
VLDL: 28 mg/dL (ref <30)

## 2016-01-03 LAB — CK: CK TOTAL: 38 U/L (ref 7–177)

## 2016-01-04 NOTE — Assessment & Plan Note (Signed)
I think her pains are most likely from her fibromyalgia. The runny nose and headache isn't quite typical of the fenofibrate however she was a trial off of this. She's been on it for few years. We'll give her a trial off. She will continue her fibromyalgia medications

## 2016-01-04 NOTE — Assessment & Plan Note (Signed)
Recheck triglycerides. TriCor for now to see how she does she's on a vegan diet

## 2016-01-04 NOTE — Assessment & Plan Note (Signed)
Currently controlled she had flu shot which is likely causing the myalgias in her arms but otherwise no sign of any reaction to the flu shot

## 2016-02-13 ENCOUNTER — Other Ambulatory Visit: Payer: Self-pay | Admitting: Family Medicine

## 2016-03-08 ENCOUNTER — Encounter: Payer: Medicare Other | Admitting: Family Medicine

## 2016-03-12 ENCOUNTER — Ambulatory Visit (INDEPENDENT_AMBULATORY_CARE_PROVIDER_SITE_OTHER): Payer: BC Managed Care – PPO | Admitting: Family Medicine

## 2016-03-12 ENCOUNTER — Encounter: Payer: Self-pay | Admitting: Family Medicine

## 2016-03-12 VITALS — BP 106/68 | HR 62 | Temp 98.0°F | Resp 14 | Ht 68.0 in | Wt 172.0 lb

## 2016-03-12 DIAGNOSIS — Z0000 Encounter for general adult medical examination without abnormal findings: Secondary | ICD-10-CM

## 2016-03-12 DIAGNOSIS — M816 Localized osteoporosis [Lequesne]: Secondary | ICD-10-CM | POA: Diagnosis not present

## 2016-03-12 DIAGNOSIS — E559 Vitamin D deficiency, unspecified: Secondary | ICD-10-CM | POA: Diagnosis not present

## 2016-03-12 DIAGNOSIS — E782 Mixed hyperlipidemia: Secondary | ICD-10-CM | POA: Diagnosis not present

## 2016-03-12 LAB — CBC WITH DIFFERENTIAL/PLATELET
BASOS ABS: 31 {cells}/uL (ref 0–200)
Basophils Relative: 1 %
EOS ABS: 155 {cells}/uL (ref 15–500)
EOS PCT: 5 %
HCT: 41.3 % (ref 35.0–45.0)
Hemoglobin: 13.4 g/dL (ref 12.0–15.0)
LYMPHS PCT: 36 %
Lymphs Abs: 1116 cells/uL (ref 850–3900)
MCH: 28.6 pg (ref 27.0–33.0)
MCHC: 32.4 g/dL (ref 32.0–36.0)
MCV: 88.1 fL (ref 80.0–100.0)
MPV: 12.4 fL (ref 7.5–12.5)
Monocytes Absolute: 279 cells/uL (ref 200–950)
Monocytes Relative: 9 %
NEUTROS ABS: 1519 {cells}/uL (ref 1500–7800)
Neutrophils Relative %: 49 %
PLATELETS: 187 10*3/uL (ref 140–400)
RBC: 4.69 MIL/uL (ref 3.80–5.10)
RDW: 13.9 % (ref 11.0–15.0)
WBC: 3.1 10*3/uL — ABNORMAL LOW (ref 3.8–10.8)

## 2016-03-12 LAB — COMPREHENSIVE METABOLIC PANEL
ALT: 21 U/L (ref 6–29)
AST: 25 U/L (ref 10–35)
Albumin: 4.1 g/dL (ref 3.6–5.1)
Alkaline Phosphatase: 63 U/L (ref 33–130)
BUN: 10 mg/dL (ref 7–25)
CHLORIDE: 107 mmol/L (ref 98–110)
CO2: 27 mmol/L (ref 20–31)
CREATININE: 0.58 mg/dL (ref 0.50–0.99)
Calcium: 9.2 mg/dL (ref 8.6–10.4)
GLUCOSE: 87 mg/dL (ref 70–99)
POTASSIUM: 4 mmol/L (ref 3.5–5.3)
SODIUM: 141 mmol/L (ref 135–146)
Total Bilirubin: 0.7 mg/dL (ref 0.2–1.2)
Total Protein: 6.6 g/dL (ref 6.1–8.1)

## 2016-03-12 LAB — LIPID PANEL
CHOL/HDL RATIO: 5.5 ratio — AB (ref <5.0)
CHOLESTEROL: 219 mg/dL — AB (ref <200)
HDL: 40 mg/dL — ABNORMAL LOW (ref >50)
LDL CALC: 147 mg/dL — AB (ref <100)
Triglycerides: 162 mg/dL — ABNORMAL HIGH (ref <150)
VLDL: 32 mg/dL — AB (ref <30)

## 2016-03-12 NOTE — Patient Instructions (Addendum)
Cologuard testing Plan for mammogram/bone density In Oct  Look at shingles information We will call with lab results F/U 6 months

## 2016-03-12 NOTE — Progress Notes (Signed)
Subjective:   Patient presents for Medicare Annual/Subsequent preventive examination.  Patient here for wellness exam. After her last visit her TriCor was stopped and she had fatigued muscle aches and other symptoms at were not quite typical of the drug but she states they're now all resolved since she stopped the medication She is watching her diet and eating healthy fats and oils. No new concerns today her asthma has been fairly well controlled. She does have some issues when they burned with Stoker in the winter but she does have her inhalers if needed. She still followed by Dr. Brien Few pain clinic for her back pain he has on hydrocodone which she uses sparingly  Review Past Medical/Family/Social: Per EMR    Risk Factors  Current exercise habits: walks Dietary issues discussed: None   Cardiac risk factors: High cholesterol  Depression Screen  (Note: if answer to either of the following is "Yes", a more complete depression screening is indicated)  Over the past two weeks, have you felt down, depressed or hopeless? No Over the past two weeks, have you felt little interest or pleasure in doing things? No Have you lost interest or pleasure in daily life? No Do you often feel hopeless? No Do you cry easily over simple problems? No   Activities of Daily Living  In your present state of health, do you have any difficulty performing the following activities?:  Driving? yes Managing money? No  Feeding yourself? No  Getting from bed to chair? No  Climbing a flight of stairs? No  Preparing food and eating?: No  Bathing or showering? No  Getting dressed: No  Getting to the toilet? No  Using the toilet:No  Moving around from place to place: No  In the past year have you fallen or had a near fall?yes  Are you sexually active? yes Do you have more than one partner? No   Hearing Difficulties: No  Do you often ask people to speak up or repeat themselves? No  Do you experience ringing or  noises in your ears? No Do you have difficulty understanding soft or whispered voices? No  Do you feel that you have a problem with memory? No Do you often misplace items? No  Do you feel safe at home? Yes  Cognitive Testing  Alert? Yes Normal Appearance?Yes  Oriented to person? Yes Place? Yes  Time? Yes  Recall of three objects? Yes  Can perform simple calculations? Yes  Displays appropriate judgment?Yes  Can read the correct time from a watch face?Yes   List the Names of Other Physician/Practitioners you currently use:   Dr.Bartko- gives pain medicine/norco and zanaflex  Optho- [facility]- appt this month      Screening Tests / Date Colonoscopy        Declines / agrees to Cologuard          Zostavax  Declines  Mammogram  UTD  Influenza Vaccine - UTD Pneumonia vaccine- UTD Tetanus/tdap UTD  Bone Density 2016  ROS: GEN- denies fatigue, fever, weight loss,weakness, recent illness HEENT- denies eye drainage, change in vision, nasal discharge, CVS- denies chest pain, palpitations RESP- denies SOB, cough, wheeze ABD- denies N/V, change in stools, abd pain GU- denies dysuria, hematuria, dribbling, incontinence MSK- + joint pain, muscle aches, injury Neuro- denies headache, dizziness, syncope, seizure activity  Physical: GEN- NAD, alert and oriented x3 HEENT- PERRL, EOMI, non injected sclera, pink conjunctiva, MMM, oropharynx clear, TM clear bilat, no effusion  Neck- Supple, no thryomegaly CVS-  RRR, no murmur RESP-CTAB ABD-NABS,soft,TN,ND  EXT- No edema Pulses- Radial, DP- 2+     Assessment:    Annual wellness medicare exam   Plan:    During the course of the visit the patient was educated and counseled about appropriate screening and preventive services including:  Screening mammography /Bone Density due in Oct 2018    Shingles vaccine. Discussed vaccine given handout- pt will decide  Colon cancer screening- see if cologuard testing covered .  Screen  Neg for depression.    Pt has advanced directives and living will  Diet review for nutrition referral? Yes ____ Not Indicated __x__  Patient Instructions (the written plan) was given to the patient.  Medicare Attestation  I have personally reviewed:  The patient's medical and social history  Their use of alcohol, tobacco or illicit drugs  Their current medications and supplements  The patient's functional ability including ADLs,fall risks, home safety risks, cognitive, and hearing and visual impairment  Diet and physical activities  Evidence for depression or mood disorders  The patient's weight, height, BMI, and visual acuity have been recorded in the chart. I have made referrals, counseling, and provided education to the patient based on review of the above and I have provided the patient with a written personalized care plan for preventive services.

## 2016-03-13 ENCOUNTER — Encounter: Payer: Self-pay | Admitting: Family Medicine

## 2016-03-13 LAB — VITAMIN D 25 HYDROXY (VIT D DEFICIENCY, FRACTURES): VIT D 25 HYDROXY: 25 ng/mL — AB (ref 30–100)

## 2016-03-26 ENCOUNTER — Encounter: Payer: Self-pay | Admitting: *Deleted

## 2016-04-23 ENCOUNTER — Telehealth: Payer: Self-pay | Admitting: *Deleted

## 2016-04-23 ENCOUNTER — Other Ambulatory Visit: Payer: Self-pay | Admitting: Family Medicine

## 2016-04-23 DIAGNOSIS — M810 Age-related osteoporosis without current pathological fracture: Secondary | ICD-10-CM

## 2016-04-23 DIAGNOSIS — Z1231 Encounter for screening mammogram for malignant neoplasm of breast: Secondary | ICD-10-CM

## 2016-04-23 DIAGNOSIS — Z780 Asymptomatic menopausal state: Secondary | ICD-10-CM

## 2016-04-23 NOTE — Telephone Encounter (Signed)
Order bone density, Dx- osteoporosis

## 2016-04-23 NOTE — Telephone Encounter (Signed)
Order placed.   Call placed to patient and patient made aware per VM.   Patient to contact to schedule.

## 2016-04-23 NOTE — Telephone Encounter (Signed)
Received call from patient.   Reports that she requires NO for bone density before she can schedule with [facility] radiology.   Ok to order?

## 2016-05-01 LAB — COLOGUARD: Cologuard: NEGATIVE

## 2016-05-07 ENCOUNTER — Other Ambulatory Visit: Payer: Self-pay | Admitting: Family Medicine

## 2016-05-15 ENCOUNTER — Ambulatory Visit (HOSPITAL_COMMUNITY)
Admission: RE | Admit: 2016-05-15 | Discharge: 2016-05-15 | Disposition: A | Discharge status: Home / Self Care | Payer: Medicare Other | Source: Ambulatory Visit | Attending: Family Medicine | Admitting: Family Medicine

## 2016-05-15 DIAGNOSIS — M810 Age-related osteoporosis without current pathological fracture: Secondary | ICD-10-CM | POA: Diagnosis not present

## 2016-05-15 DIAGNOSIS — M85851 Other specified disorders of bone density and structure, right thigh: Secondary | ICD-10-CM | POA: Insufficient documentation

## 2016-05-15 DIAGNOSIS — Z780 Asymptomatic menopausal state: Secondary | ICD-10-CM | POA: Insufficient documentation

## 2016-05-15 DIAGNOSIS — Z1231 Encounter for screening mammogram for malignant neoplasm of breast: Secondary | ICD-10-CM | POA: Diagnosis not present

## 2016-05-15 DIAGNOSIS — M85832 Other specified disorders of bone density and structure, left forearm: Secondary | ICD-10-CM | POA: Diagnosis not present

## 2016-06-04 ENCOUNTER — Other Ambulatory Visit: Payer: Self-pay | Admitting: Family Medicine

## 2016-06-10 ENCOUNTER — Other Ambulatory Visit: Payer: Self-pay | Admitting: Family Medicine

## 2016-06-10 ENCOUNTER — Ambulatory Visit (INDEPENDENT_AMBULATORY_CARE_PROVIDER_SITE_OTHER): Payer: BC Managed Care – PPO | Admitting: Family Medicine

## 2016-06-10 ENCOUNTER — Ambulatory Visit (HOSPITAL_COMMUNITY)
Admission: RE | Admit: 2016-06-10 | Discharge: 2016-06-10 | Disposition: A | Discharge status: Home / Self Care | Payer: BC Managed Care – PPO | Source: Ambulatory Visit | Attending: Family Medicine | Admitting: Family Medicine

## 2016-06-10 ENCOUNTER — Encounter: Payer: Self-pay | Admitting: Family Medicine

## 2016-06-10 VITALS — BP 110/62 | HR 64 | Temp 97.9°F | Resp 14 | Ht 68.0 in | Wt 164.0 lb

## 2016-06-10 DIAGNOSIS — S59902A Unspecified injury of left elbow, initial encounter: Secondary | ICD-10-CM | POA: Diagnosis not present

## 2016-06-10 DIAGNOSIS — M25512 Pain in left shoulder: Secondary | ICD-10-CM | POA: Diagnosis not present

## 2016-06-10 DIAGNOSIS — M19012 Primary osteoarthritis, left shoulder: Secondary | ICD-10-CM | POA: Diagnosis not present

## 2016-06-10 DIAGNOSIS — M1612 Unilateral primary osteoarthritis, left hip: Secondary | ICD-10-CM | POA: Insufficient documentation

## 2016-06-10 DIAGNOSIS — W108XXD Fall (on) (from) other stairs and steps, subsequent encounter: Secondary | ICD-10-CM | POA: Diagnosis not present

## 2016-06-10 DIAGNOSIS — M25532 Pain in left wrist: Secondary | ICD-10-CM | POA: Diagnosis not present

## 2016-06-10 DIAGNOSIS — M25552 Pain in left hip: Secondary | ICD-10-CM | POA: Insufficient documentation

## 2016-06-10 DIAGNOSIS — W19XXXA Unspecified fall, initial encounter: Secondary | ICD-10-CM | POA: Insufficient documentation

## 2016-06-10 DIAGNOSIS — T1490XA Injury, unspecified, initial encounter: Secondary | ICD-10-CM | POA: Diagnosis not present

## 2016-06-10 DIAGNOSIS — M25522 Pain in left elbow: Secondary | ICD-10-CM | POA: Diagnosis not present

## 2016-06-10 DIAGNOSIS — S79912A Unspecified injury of left hip, initial encounter: Secondary | ICD-10-CM | POA: Diagnosis not present

## 2016-06-10 DIAGNOSIS — S4992XA Unspecified injury of left shoulder and upper arm, initial encounter: Secondary | ICD-10-CM | POA: Diagnosis not present

## 2016-06-10 NOTE — Patient Instructions (Addendum)
Referral to Orthopedics  Get the xrays  F/U as previous

## 2016-06-10 NOTE — Progress Notes (Signed)
   Subjective:    Patient ID: Jennifer Hays, female    DOB: 29-Sep-1945, 71 y.o.   MRN: [id number]  Patient presents for R Arm Pain (S/P fall landing on R side (04/2016)- states that pain is not improving)    [address] was walking down the stairs, and righ tknee gave out on her. She fell mostly on left side, hit elbow on left side and Hip. Didn't feel like she had broken anything, but was seen by Dr. Brien Few recommended  Orthopedics  Still has pain in left arm from shoulder down, when she puts weight on it, has severe pain, occaszionally sees blue in fingertips Denies tingling Had bruising on buttocks, used topicals And taken her chronic pain medicine prescribed by a pain doctor.  2002 had fall which injured Right knee/Hip chronic pain from this - still on workers Comp/ has trigger injection for right side pain by Dr. 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 since her fall was due to her right knee giving out   Review Of Systems:  GEN- denies fatigue, fever, weight loss,weakness, recent illness HEENT- denies eye drainage, change in vision, nasal discharge, CVS- denies chest pain, palpitations RESP- denies SOB, cough, wheeze ABD- denies N/V, change in stools, abd pain GU- denies dysuria, hematuria, dribbling, incontinence MSK- + joint pain, muscle aches, injury Neuro- denies headache, dizziness, syncope, seizure activity       Objective:    BP 110/62   Pulse 64   Temp 97.9 F (36.6 C) (Oral)   Resp 14   Ht 5\' 8"  (1.727 m)   Wt 164 lb (74.4 kg)   SpO2 98%   BMI 24.94 kg/m  GEN- NAD, alert and oriented x3  MSK- fair ROM neck, mild pain with empty can left side, overall rotator cuff in tact. Biceps in tact, TTP over AC, TTP over lateral olecranon, good ROM elbow and wrist  TTP Left Hip, pain with IR/ER, fair ROM bilat knee, no effusion Walks with cane  EXT- No edema Pulses- Radial, DP- 2+        Assessment & Plan:      Problem List Items  Addressed This Visit    None    Visit Diagnoses    Acute pain of left shoulder    -  Primary   S/P fall with persistant bony pain, known Osteoporosis. Obtain xray shoulder, Hip/ Knee rule out fracture, no effusion or bruising seen. Possible strain in shoulder and buritis from fall/inflammation in hip Referral to orthopedics at request as well   Relevant Orders   DG Elbow Complete Left   Left hip pain       Relevant Orders   DG HIP UNILAT WITH PELVIS 2-3 VIEWS LEFT   Injury of left elbow, initial encounter       Relevant Orders   DG Shoulder Left      Note: This dictation was prepared with Dragon dictation along with smaller phrase technology. Any transcriptional errors that result from this process are unintentional.

## 2016-07-03 ENCOUNTER — Telehealth: Payer: Self-pay | Admitting: *Deleted

## 2016-07-03 NOTE — Telephone Encounter (Signed)
Received VM from patient.   States that she has questions about referral to Dr. Luna Glasgow.   Please call patient at [phone].

## 2016-07-04 NOTE — Telephone Encounter (Signed)
Pt contact me on 06/19/16.  I gave her phone number to Dr Brooke Bonito office.  I called [facility], they said they are behind but will call pt today and schedule her for next week.

## 2016-07-07 ENCOUNTER — Other Ambulatory Visit: Payer: Self-pay | Admitting: Family Medicine

## 2016-07-09 ENCOUNTER — Ambulatory Visit: Payer: Medicare Other | Admitting: Orthopaedic Surgery

## 2016-07-10 ENCOUNTER — Encounter: Payer: Self-pay | Admitting: Orthopaedic Surgery

## 2016-07-10 ENCOUNTER — Other Ambulatory Visit: Payer: Self-pay | Admitting: Family Medicine

## 2016-07-10 ENCOUNTER — Ambulatory Visit (INDEPENDENT_AMBULATORY_CARE_PROVIDER_SITE_OTHER): Payer: BC Managed Care – PPO | Admitting: Orthopaedic Surgery

## 2016-07-10 VITALS — BP 123/81 | HR 79 | Ht 68.0 in | Wt 160.0 lb

## 2016-07-10 DIAGNOSIS — M25512 Pain in left shoulder: Secondary | ICD-10-CM

## 2016-07-10 DIAGNOSIS — M25522 Pain in left elbow: Secondary | ICD-10-CM

## 2016-07-10 NOTE — Progress Notes (Signed)
Subjective:    Patient ID: Jennifer Hays, female    DOB: Apr 30, 1945, 71 y.o.   MRN: [id number]  HPI She fell and hurt her left shoulder, left elbow and her left wrist about five to six weeks ago.  She was seen by Dr. Buelah Manis on 06-10-16 and had x-rays of the shoulder, elbow and wrist which were all negative.  She continues to have pain.  She has no swelling, no numbness.  She has pain with moving her arm overhead and with fully extending her elbow.  She has no redness or swelling.  She has disability with the right arm from old injury so the left side is very important to her.  She has tried Tylenol and ice and heat with no help. She cannot take NSAIDs.    Review of Systems  HENT: Negative for congestion.   Respiratory: Negative for cough and shortness of breath.   Cardiovascular: Negative for chest pain and leg swelling.  Endocrine: Positive for cold intolerance.  Musculoskeletal: Positive for arthralgias.  Allergic/Immunologic: Positive for environmental allergies.   Past Medical History:  Diagnosis Date  . Allergy   . Asthma   . Atrophic vaginitis   . Osteopenia     Past Surgical History:  Procedure Laterality Date  . ABDOMINAL HYSTERECTOMY    . KNEE SURGERY     Right  . SPINE SURGERY      Current Outpatient Prescriptions on File Prior to Visit  Medication Sig Dispense Refill  . ADVAIR DISKUS 250-50 MCG/DOSE AEPB INHALE 1 PUFF INTO THE LUNGS 2 TIMES DAILY. 60 each 0  . albuterol (PROVENTIL HFA;VENTOLIN HFA) 108 (90 BASE) MCG/ACT inhaler Inhale 2 puffs into the lungs every 4 (four) hours as needed for wheezing or shortness of breath. 1 Inhaler 3  . albuterol (PROVENTIL) (2.5 MG/3ML) 0.083% nebulizer solution Take 3 mLs (2.5 mg total) by nebulization every 6 (six) hours as needed for wheezing. 150 mL 6  . Ascorbic Acid (VITAMIN C) 1000 MG tablet Take 500 mg by mouth daily.     . budesonide (PULMICORT) 1 MG/2ML nebulizer solution Take 2 mLs (1 mg total) by nebulization daily. 120  mL 3  . Calcium Carbonate-Vitamin D (CALCIUM 600/VITAMIN D) 600-400 MG-UNIT per tablet Take 1 tablet by mouth 2 (two) times daily.    . cetirizine (ZYRTEC) 10 MG tablet Take 10 mg by mouth daily.    . clobetasol cream (TEMOVATE) 6.83 % Apply 1 application topically 2 (two) times daily. 45 g 1  . fenofibrate 54 MG tablet TAKE 1 TABLET (54 MG TOTAL) BY MOUTH DAILY. 90 tablet 1  . HYDROcodone-acetaminophen (NORCO/VICODIN) 5-325 MG per tablet Take 1 tablet by mouth every 6 (six) hours as needed for moderate pain.    Marland Kitchen lidocaine (LIDODERM) 5 % Place 1 patch onto the skin daily. Remove & Discard patch within 12 hours or as directed by MD    . Meth-Hyo-M Bl-Na Phos-Ph Sal (URIBEL) 118 MG CAPS Take as needed 120 capsule   . Multiple Vitamin (MULTIVITAMIN) tablet Take 1 tablet by mouth daily.    . sodium chloride (OCEAN) 0.65 % SOLN nasal spray Place 1 spray into both nostrils as needed for congestion.    . tizanidine (ZANAFLEX) 2 MG capsule   3   No current facility-administered medications on file prior to visit.     Social History   Social History  . Marital status: Married    Spouse name: N/A  . Number of children: N/A  .  Years of education: N/A   Occupational History  . Not on file.   Social History Main Topics  . Smoking status: Former Research scientist (life sciences)  . Smokeless tobacco: Never Used     Comment: quit 1970's  . Alcohol use No  . Drug use: No  . Sexual activity: Yes   Other Topics Concern  . Not on file   Social History Narrative  . No narrative on file    History of heart disease in the family. BP 123/81   Pulse 79   Ht 5\' 8"  (1.727 m)   Wt 160 lb (72.6 kg)   BMI 24.33 kg/m      Objective:   Physical Exam  Constitutional: She is oriented to person, place, and time. She appears well-developed and well-nourished.  HENT:  Head: Normocephalic and atraumatic.  Eyes: Conjunctivae and EOM are normal. Pupils are equal, round, and reactive to light.  Neck: Normal range of motion.  Neck supple.  Cardiovascular: Normal rate, regular rhythm and intact distal pulses.   Pulmonary/Chest: Effort normal.  Abdominal: Soft.  Musculoskeletal: She exhibits tenderness (She has full motion of the left shoulder, left elbow and left wrist but they are all tender.  NV intact.  Grip is normal.  No color changes.).  Neurological: She is alert and oriented to person, place, and time. She displays normal reflexes. No cranial nerve deficit. She exhibits normal muscle tone. Coordination normal.  Skin: Skin is warm and dry.  Psychiatric: She has a normal mood and affect. Her behavior is normal. Judgment and thought content normal.  Vitals reviewed.  I have reviewed her prior x-rays.       Assessment & Plan:   Encounter Diagnoses  Name Primary?  . Left elbow pain Yes  . Pain in joint of left shoulder    I will have her begin PT/OT.  Continue Tylenol.  Return in two weeks.  Call if any problem.  Precautions discussed.  Electronically Signed Sanjuana Kava, MD 5/9/20188:16 PM

## 2016-07-17 ENCOUNTER — Ambulatory Visit (HOSPITAL_COMMUNITY): Payer: BC Managed Care – PPO | Attending: Orthopaedic Surgery | Admitting: Specialist

## 2016-07-17 DIAGNOSIS — R29898 Other symptoms and signs involving the musculoskeletal system: Secondary | ICD-10-CM

## 2016-07-17 DIAGNOSIS — M25512 Pain in left shoulder: Secondary | ICD-10-CM | POA: Insufficient documentation

## 2016-07-17 NOTE — Therapy (Signed)
[place], [place], [address] Phone: [phone]   Fax:  [phone]  Occupational Therapy Evaluation  Patient Details  Name: Jennifer Hays MRN: [id number] Date of Birth: [birth year] Referring Provider: Dr. Sanjuana Kava  Encounter Date: 07/17/2016      OT End of Session - 07/17/16 1557    Visit Number 1   Number of Visits 8   Date for OT Re-Evaluation 08/16/16   Authorization Type Medicare   Authorization Time Period before 10th visit   Authorization - Visit Number 1   Authorization - Number of Visits 10   OT Start Time 1300   OT Stop Time 1350   OT Time Calculation (min) 50 min   Activity Tolerance Patient tolerated treatment well   Behavior During Therapy [facility] for tasks assessed/performed      Past Medical History:  Diagnosis Date  . Allergy   . Asthma   . Atrophic vaginitis   . Osteopenia     Past Surgical History:  Procedure Laterality Date  . ABDOMINAL HYSTERECTOMY    . KNEE SURGERY     Right  . SPINE SURGERY      There were no vitals filed for this visit.      Subjective Assessment - 07/17/16 1552    Subjective  S:  My entire arm just aches when I move it.    Pertinent History Jennifer Hays reports falling down her steps on 04/30/16, landing on her left elbow and hitting her head and neck.  She reported soreness in her left arm after the fall that eventually became an ache.  She consulted with her primary care MD and had xrays which are negative. She was referred to Dr. Luna Glasgow for evaluation and treatment and was referred to occupational therapy for evaluation and treatment.    Special Tests DASH 43.18   Patient Stated Goals I want to get my arm to where it is not hurting.    Currently in Pain? Yes   Pain Score 6    Pain Location Shoulder   Pain Orientation Left   Pain Descriptors / Indicators Aching   Pain Type Acute pain   Pain Onset More than a month ago   Pain Frequency Intermittent    Aggravating Factors  after a sustained position or lifting   Pain Relieving Factors heat   Effect of Pain on Daily Activities decreased use of left arm   Multiple Pain Sites Yes   Pain Score 3   Pain Location Elbow   Pain Orientation Left   Pain Descriptors / Indicators Aching   Pain Type Acute pain   Pain Onset More than a month ago   Pain Frequency Intermittent   Aggravating Factors  see above   Pain Relieving Factors see above   Effect of Pain on Daily Activities see above   Pain Score 4   Pain Location Wrist   Pain Orientation Left   Pain Descriptors / Indicators Aching   Pain Type Acute pain   Pain Onset More than a month ago   Pain Frequency Intermittent   Aggravating Factors  see above   Pain Relieving Factors see above   Effect of Pain on Daily Activities see above           [facility] OT Assessment - 07/17/16 0001      Assessment   Diagnosis Left Arm Pain S/P Fall   Referring Provider Dr. Sanjuana Kava   Onset Date 04/30/16  Prior Therapy n/a     Precautions   Precautions None     Restrictions   Weight Bearing Restrictions No     Balance Screen   Has the patient fallen in the past 6 months Yes   How many times? 1   Has the patient had a decrease in activity level because of a fear of falling?  No   Is the patient reluctant to leave their home because of a fear of falling?  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 She is not able to lift anything heavy or use her left arm without experiencing and  increased aching pain in her left arm.       Written Expression   Dominant Hand Right     Vision - History   Baseline Vision Wears glasses all the time     Cognition   Overall Cognitive Status Within Functional Limits for tasks assessed     Observation/Other Assessments   Other Surveys  Select   Quick DASH  43.18     Sensation   Light Touch Impaired Detail   Light Touch Impaired Details Impaired LUE   Semmes Weinstein Monofilament Scale Diminshed Light Touch     Coordination   Gross Motor Movements are Fluid and Coordinated Yes   Fine Motor Movements are Fluid and Coordinated Yes     ROM / Strength   AROM / PROM / Strength AROM;PROM;Strength     Palpation   Palpation comment mod-max fascial restrictions noted in left upper arm, scapular, trapezius region      AROM   Overall AROM Comments LUE A/ROM is WNL     PROM   Overall PROM Comments LUE P/ROM is WNL     Strength   Overall Strength Comments LUE strength is 4-/5     Hand Function   Right Hand Grip (lbs) 35   Right Hand Lateral Pinch 12 lbs   Right Hand 3 Point Pinch 11 lbs   Left Hand Grip (lbs) 18   Left Hand Lateral Pinch 9 lbs   Left 3 point pinch 6 lbs             Quick Dash - 07/17/16 0001    Open a tight or new jar Unable   Do heavy household chores (wash walls, wash floors) Unable   Carry a shopping bag or briefcase No difficulty   Wash your back Moderate difficulty   Use a knife to cut food No difficulty   Recreational activities in which you take some force or impact through your arm, shoulder, or hand (golf, hammering, tennis) No difficulty   During the past week, to what extent has your arm, shoulder or hand problem interfered with your normal social activities with family, friends, neighbors, or groups? Not at all   During the past week, to what extent has your arm, shoulder or hand problem limited your work or other regular daily activities Quite a bit   Arm,  shoulder, or hand pain. Severe    Tingling (pins and needles) in your arm, shoulder, or hand Mild   Difficulty Sleeping Moderate difficulty   DASH Score 43.18 %             OT Treatments/Exercises (OP) - 07/17/16 0001      Manual Therapy   Manual Therapy Myofascial release   Manual therapy comments manual therapy compelted seperately from all other interventions this date   Myofascial Release Myofascial release and manual stretching to left upper arm, scapular region, and associated areas to decrease fascial restrictions and muscle knots that are causing decreased functional use of her left arm.                 OT Education - 07/17/16 1556    Education provided Yes   Education Details Educated patient on cervical stretches and pendulum exercises, A/ROM elbow - wrist    Person(s) Educated Patient   Methods Explanation;Demonstration;Handout   Comprehension Verbalized understanding;Returned demonstration          OT Short Term Goals - 07/17/16 2150      OT SHORT TERM GOAL #1   Title Patient will be educated on HEP for improved left shoulder anad arm strength.   Time 4   Period Weeks   Status New     OT SHORT TERM GOAL #2   Title Patient will improve left arm strength to 5/5 for improved ability to lift clothes into overhead cabinets.   Time 4   Period Weeks   Status New     OT SHORT TERM GOAL #3   Title Patient will improve left grip stength 15 pounds and pinch strength by 2 pounds or more for greater ability to maintain grasp on items.     Time 4   Period Weeks     OT SHORT TERM GOAL #4   Title Patient will improve left arm sensation to 2.83 normal sensation for greater safety when completing ADLs.   Baseline 4   Period Weeks   Status New     OT SHORT TERM GOAL #5   Title Patient will decrease pain to 2/10 or better in left arm for graeater ability to  complete BADLs.    Time 4   Period Weeks   Status New                  Plan - 07/17/16 2143    Clinical Impression  Statement A:  Patient is a 71 year old female who has been experiencing pain in her left shoulder and arm since 05/01/16 when she fell going down her steps.  She has past medical history significant for fibromyalgia, asthma, osteoprosis, and SI joint pain.  Her left arm pain is causing decreased indpendence wtih functional activities such as washing ber back and picking up items are very difficult activiities to complete.     Rehab Potential Good   OT Frequency 2x / week   OT Duration 4 weeks   OT Treatment/Interventions Self-care/ADL training;Cryotherapy;Ultrasound;Electrical Stimulation;Moist Heat;Iontophoresis;Neuromuscular education;Therapeutic exercise;Manual Therapy;Passive range of motion;Therapeutic exercises;Therapeutic activities;Patient/family education   Plan P:  Skilled OT intervention to improve fascial restrictions in left shoulder region to decrease pain and fascial restrictions and improve pain free moblity in her left arm in order to return to prior level of function with daily tasks .   OT Home Exercise Plan 07/17/16:  pendulums, wrist A/ROM       Patient will benefit from skilled therapeutic intervention in  order to improve the following deficits and impairments:  Decreased strength, Increased fascial restricitons, Increased muscle spasms, Impaired UE functional use, Pain  Visit Diagnosis: Acute pain of left shoulder - Plan: Ot plan of care cert/re-cert  Other symptoms and signs involving the musculoskeletal system - Plan: Ot plan of care cert/re-cert      G-Codes - 51/76/16 06-04-2207    Functional Assessment Tool Used (Outpatient only) DASH 38/100, 38% impaired   Functional Limitation Carrying, moving and handling objects   Carrying, Moving and Handling Objects Current Status [phone]) At least 20 percent but less than 40 percent impaired, limited or restricted   Carrying, Moving and Handling Objects Goal Status ([id number]) At least 20 percent but less than 40 percent impaired, limited  or restricted      Problem List Patient Active Problem List   Diagnosis Date Noted  . Osteoporosis 05/12/2013  . UTI (urinary tract infection) 03/28/2013  . Hyperlipidemia 01/19/2013  . Colon cancer screening 01/19/2013  . 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 ALLERGIC RHINITIS 09/10/2007  . GERD 09/10/2007  . Fibromyalgia 09/10/2007    Vangie Bicker, [place], OTR/L [phone]  07/17/2016, 10:14 PM  [place] [address] [place], [place], [address] Phone: [phone]   Fax:  [phone]  Name: Jennifer Hays MRN: [id number] Date of Birth: 24-Mar-1945

## 2016-07-17 NOTE — Patient Instructions (Signed)
Flexibility: Upper Trapezius Stretch   Gently grasp right side of head while reaching behind back with other hand. Tilt head away until a gentle stretch is felt. Hold ____ seconds. Repeat ____ times per set. Do ____ sets per session. Do ____ sessions per day.  http://orth.exer.us/340   Levator Stretch   Grasp seat or sit on hand on side to be stretched. Turn head toward other side and look down. Use hand on head to gently stretch neck in that position. Hold ____ seconds. Repeat on other side. Repeat ____ times. Do ____ sessions per day.  http://[email or web address].exer.us/30   Scapular Retraction (Standing)   With arms at sides, pinch shoulder blades together. Repeat ____ times per set. Do ____ sets per session. Do ____ sessions per day.  http://orth.exer.us/944   Flexibility: Neck Retraction   Pull head straight back, keeping eyes and jaw level. Repeat ____ times per set. Do ____ sets per session. Do ____ sessions per day.  http://orth.exer.us/344   Posture - Sitting   Sit upright, head facing forward. Try using a roll to support lower back. Keep shoulders relaxed, and avoid rounded back. Keep hips level with knees. Avoid crossing legs for long periods.   Flexibility: Corner Stretch   Standing in corner with hands just above shoulder level and feet ____ inches from corner, lean forward until a comfortable stretch is felt across chest. Hold ____ seconds. Repeat ____ times per set. Do ____ sets per session. Do ____ sessions per day.  http://orth.exer.us/342   Copyright  VHI. All rights reserved.        COMPLETE PENDULUM EXERCISES FOR 30 SECONDS TO A MINUTE EACH, 3-5 TIMES PER DAY. ROM: Pendulum (Side-to-Side)   Deje que el brazo derecho se balancee suavemente de lado a lado mientras oscila el cuerpo de [place]. Repita ____ veces por rutina. Realice ____ rutinas por sesin. Realice ____ sesiones por da.  http://orth.exer.us/792   Copyright  VHI. All rights  reserved.  Pendulum Forward/Back   Bend forward 90 at waist, using table for support. Rock body forward and back to swing arm. Repeat ____ times. Do ____ sessions per day.  Copyright  VHI. All rights reserved.  Pendulum Circular   Bend forward 90 at waist, leaning on table for support. Rock body in a circular pattern to move arm clockwise ____ times then counterclockwise ____ times. Do ____ sessions per day.  Copyright  VHI. All rights reserved.  AROM: Wrist Extension   With right palm down, bend wrist up. Repeat 10____ times per set. Do ____ sets per session. Do __3__ sessions per day.  Copyright  VHI. All rights reserved.   AROM: Wrist Flexion   With right palm up, bend wrist up. Repeat ___10_ times per set. Do ____ sets per session. Do __3__ sessions per day.  Copyright  VHI. All rights reserved.   AROM: Forearm Pronation / Supination   With right arm in handshake position, slowly rotate palm down until stretch is felt. Relax. Then rotate palm up until stretch is felt. Repeat __10__ times per set. Do ____ sets per session. Do __3__ sessions per day.  Copyright  VHI. All rights reserved.   AFlexion (Passive)   Use other hand to bend elbow, with thumb toward same shoulder. Do NOT force this motion. Hold ____ seconds. Repeat ____ times. Do ____ sessions per day.  Copyright  VHI. All rights reserved.    With left hand palm up, gently bend elbow as far as possible. Then straighten arm as far  as possible. Repeat ____ times per set. Do ____ sets per session. Do ____ sessions per day.  Copyright  VHI. All rights reserved.

## 2016-07-19 ENCOUNTER — Ambulatory Visit (HOSPITAL_COMMUNITY): Payer: BC Managed Care – PPO | Admitting: Specialist

## 2016-07-19 ENCOUNTER — Encounter (HOSPITAL_COMMUNITY): Payer: Self-pay | Admitting: Specialist

## 2016-07-19 DIAGNOSIS — M25512 Pain in left shoulder: Secondary | ICD-10-CM

## 2016-07-19 DIAGNOSIS — R29898 Other symptoms and signs involving the musculoskeletal system: Secondary | ICD-10-CM | POA: Diagnosis not present

## 2016-07-19 NOTE — Therapy (Signed)
[place], [place], [address] Phone: [phone]   Fax:  [phone]  Occupational Therapy Treatment  Patient Details  Name: Jennifer Hays MRN: [id number] Date of Birth: [birth year] Referring Provider: Dr. Sanjuana Kava  Encounter Date: 07/19/2016      OT End of Session - 07/19/16 1111    Visit Number 2   Number of Visits 8   Date for OT Re-Evaluation 08/16/16   Authorization Type Medicare   Authorization Time Period before 10th visit   Authorization - Visit Number 2   Authorization - Number of Visits 10   OT Start Time [phone]   OT Stop Time 1030   OT Time Calculation (min) 42 min   Activity Tolerance Patient tolerated treatment well   Behavior During Therapy [facility] for tasks assessed/performed      Past Medical History:  Diagnosis Date  . Allergy   . Asthma   . Atrophic vaginitis   . Osteopenia     Past Surgical History:  Procedure Laterality Date  . ABDOMINAL HYSTERECTOMY    . KNEE SURGERY     Right  . SPINE SURGERY      There were no vitals filed for this visit.      Subjective Assessment - 07/19/16 1110    Subjective  S:  I did the exercises, I think they help   Currently in Pain? Yes   Pain Score 4    Pain Location Shoulder  elbow and hand   Pain Orientation Left   Pain Descriptors / Indicators Aching   Pain Type Acute pain   Pain Radiating Towards hand   Pain Onset More than a month ago   Pain Frequency Intermittent            [facility] OT Assessment - 07/19/16 0001      Assessment   Diagnosis Left Arm Pain S/P Fall     Precautions   Precautions None                  OT Treatments/Exercises (OP) - 07/19/16 0001      Exercises   Exercises Shoulder     Shoulder Exercises: Supine   Protraction AAROM;10 reps   Horizontal ABduction AAROM;10 reps   External Rotation AAROM;10 reps   Internal Rotation AAROM;10 reps   Flexion AAROM;10 reps   ABduction AAROM;10 reps      Shoulder Exercises: Seated   Elevation AROM;10 reps   Extension AROM;10 reps   Retraction AROM;10 reps   Other Seated Exercises elbow - hand A/ROM 10 times     Shoulder Exercises: ROM/Strengthening   X to V Arms 10 times with mod verbal guidance for technique   Proximal Shoulder Strengthening, Supine 10 times   Proximal Shoulder Strengthening, Seated 10 times     Manual Therapy   Manual Therapy Myofascial release;Manual Traction   Manual therapy comments manual therapy compelted seperately from all other interventions this date   Myofascial Release Myofascial release and manual stretching to left upper arm, scapular region, and associated areas to decrease fascial restrictions and muscle knots that are causing decreased functional use of her left arm.    Manual Traction manual cervical traction and myofascial release to cervical and trapezius region                OT Education - 07/19/16 1111    Education Details Reviewed HEP, no questions or concerns    Person(s) Educated Patient  Methods Explanation   Comprehension Verbalized understanding          OT Short Term Goals - 07/19/16 1113      OT SHORT TERM GOAL #1   Title Patient will be educated on HEP for improved left shoulder anad arm strength.   Time 4   Period Weeks   Status On-going     OT SHORT TERM GOAL #2   Title Patient will improve left arm strength to 5/5 for improved ability to lift clothes into overhead cabinets.   Time 4   Period Weeks   Status On-going     OT SHORT TERM GOAL #3   Title Patient will improve left grip stength 15 pounds and pinch strength by 2 pounds or more for greater ability to maintain grasp on items.     Time 4   Period Weeks   Status On-going     OT SHORT TERM GOAL #4   Title Patient will improve left arm sensation to 2.83 normal sensation for greater safety when completing ADLs.   Baseline 4   Period Weeks   Status On-going     OT SHORT TERM GOAL #5   Title  Patient will decrease pain to 2/10 or better in left arm for graeater ability to  complete BADLs.    Time 4   Period Weeks   Status On-going                  Plan - 07/19/16 1112    Clinical Impression Statement A: Patient reports increased arm pain after myofascial release to cervical and shoulder region.  began therapeutic exercises focused on improving scapular stability for less shoulder pain.    Plan P:  Decrease pain with manual therapy, add ball on wall and w arms for improved scapular stability.  Review Plan of care and issue a copy.      Patient will benefit from skilled therapeutic intervention in order to improve the following deficits and impairments:  Decreased strength, Increased fascial restricitons, Increased muscle spasms, Impaired UE functional use, Pain  Visit Diagnosis: Acute pain of left shoulder  Other symptoms and signs involving the musculoskeletal system    Problem List Patient Active Problem List   Diagnosis Date Noted  . Osteoporosis 05/12/2013  . UTI (urinary tract infection) 03/28/2013  . Hyperlipidemia 01/19/2013  . Colon cancer screening 01/19/2013  . 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 ALLERGIC RHINITIS 09/10/2007  . GERD 09/10/2007  . Fibromyalgia 09/10/2007    Vangie Bicker, [place], OTR/L [phone]  07/19/2016, 11:15 AM  [place] [address] [place], [place], [address] Phone: [phone]   Fax:  [phone]  Name: Jennifer Hays MRN: [id number] Date of Birth: 06/23/1945

## 2016-07-22 ENCOUNTER — Other Ambulatory Visit: Payer: Self-pay | Admitting: Family Medicine

## 2016-07-22 ENCOUNTER — Ambulatory Visit (HOSPITAL_COMMUNITY): Payer: BC Managed Care – PPO | Admitting: Occupational Therapy

## 2016-07-22 ENCOUNTER — Encounter (HOSPITAL_COMMUNITY): Payer: Self-pay | Admitting: Occupational Therapy

## 2016-07-22 DIAGNOSIS — R29898 Other symptoms and signs involving the musculoskeletal system: Secondary | ICD-10-CM

## 2016-07-22 DIAGNOSIS — M25512 Pain in left shoulder: Secondary | ICD-10-CM | POA: Diagnosis not present

## 2016-07-22 NOTE — Therapy (Signed)
[place], [place], [address] Phone: [phone]   Fax:  [phone]  Occupational Therapy Treatment  Patient Details  Name: Jennifer Hays MRN: [id number] Date of Birth: August 03, 1945 Referring Provider: Dr. Sanjuana Kava  Encounter Date: 07/22/2016      OT End of Session - 07/22/16 1616    Visit Number 3   Number of Visits 8   Date for OT Re-Evaluation 08/16/16   Authorization Type Medicare   Authorization Time Period before 10th visit   Authorization - Visit Number 3   Authorization - Number of Visits 10   OT Start Time 5809   OT Stop Time 1430   OT Time Calculation (min) 43 min   Activity Tolerance Patient tolerated treatment well   Behavior During Therapy [facility] for tasks assessed/performed      Past Medical History:  Diagnosis Date  . Allergy   . Asthma   . Atrophic vaginitis   . Osteopenia     Past Surgical History:  Procedure Laterality Date  . ABDOMINAL HYSTERECTOMY    . KNEE SURGERY     Right  . SPINE SURGERY      There were no vitals filed for this visit.      Subjective Assessment - 07/22/16 1346    Subjective  S: My neck hurts after I do my exercises.    Currently in Pain? Yes   Pain Score 7    Pain Location Shoulder   Pain Orientation Left   Pain Descriptors / Indicators Aching;Sore   Pain Type Acute pain   Pain Radiating Towards hand   Pain Onset More than a month ago   Pain Frequency Intermittent   Aggravating Factors  after sustained position or lifting objects   Pain Relieving Factors heat   Effect of Pain on Daily Activities decreased use of LUE            [facility] OT Assessment - 07/22/16 1345      Assessment   Diagnosis Left Arm Pain S/P Fall     Precautions   Precautions None                  OT Treatments/Exercises (OP) - 07/22/16 1350      Exercises   Exercises Shoulder;Hand     Shoulder Exercises: Supine   Protraction PROM;5 reps;AAROM;10  reps   Horizontal ABduction PROM;5 reps;AAROM;10 reps   External Rotation PROM;5 reps;AAROM;10 reps   Internal Rotation PROM;5 reps;AAROM;10 reps   Flexion PROM;5 reps;AAROM;10 reps   ABduction PROM;5 reps;AAROM;10 reps     Shoulder Exercises: Standing   Extension Theraband;10 reps   Theraband Level (Shoulder Extension) Level 2 (Red)   Row Theraband;10 reps   Theraband Level (Shoulder Row) Level 2 (Red)     Shoulder Exercises: ROM/Strengthening   "W" Arms 10X     Hand Exercises   Tendon Glides 10X     Manual Therapy   Manual Therapy Myofascial release;Manual Traction   Manual therapy comments manual therapy compelted seperately from all other interventions this date   Myofascial Release Myofascial release and manual stretching to left upper arm, scapular region, and associated areas to decrease fascial restrictions and muscle knots that are causing decreased functional use of her left arm.                 OT Education - 07/22/16 1619    Education provided Yes   Education Details tendon glides; d/c pendulums due  to inability to complete with proper form and technique secondary to back pain and right leg buckling   Person(s) Educated Patient   Methods Explanation;Demonstration;Handout   Comprehension Verbalized understanding;Returned demonstration          OT Short Term Goals - 07/19/16 1113      OT SHORT TERM GOAL #1   Title Patient will be educated on HEP for improved left shoulder anad arm strength.   Time 4   Period Weeks   Status On-going     OT SHORT TERM GOAL #2   Title Patient will improve left arm strength to 5/5 for improved ability to lift clothes into overhead cabinets.   Time 4   Period Weeks   Status On-going     OT SHORT TERM GOAL #3   Title Patient will improve left grip stength 15 pounds and pinch strength by 2 pounds or more for greater ability to maintain grasp on items.     Time 4   Period Weeks   Status On-going     OT SHORT TERM  GOAL #4   Title Patient will improve left arm sensation to 2.83 normal sensation for greater safety when completing ADLs.   Baseline 4   Period Weeks   Status On-going     OT SHORT TERM GOAL #5   Title Patient will decrease pain to 2/10 or better in left arm for graeater ability to  complete BADLs.    Time 4   Period Weeks   Status On-going                  Plan - 07/22/16 1616    Clinical Impression Statement A: Continued with myofascial release today, pt reports decreased pain after manual therapy, however pain returns upon beginning exercises. Added red theraband and w arms for scapular stability, did not add ball on wall due to pain. Also added tendon gliding exercises, as pt is reporting numbness and tingling in the hand, notable worse in the mornings, as well as pain at the medial epicondyle when resting arm on solid surface.    Plan P: Add ball on wall if pt able to tolerate for improved scapular stability   OT Home Exercise Plan 07/17/16:  pendulums, wrist A/ROM; 5/21: tendon glides, d/c pendulums.    Consulted and Agree with Plan of Care Patient      Patient will benefit from skilled therapeutic intervention in order to improve the following deficits and impairments:  Decreased strength, Increased fascial restricitons, Increased muscle spasms, Impaired UE functional use, Pain  Visit Diagnosis: Acute pain of left shoulder  Other symptoms and signs involving the musculoskeletal system    Problem List Patient Active Problem List   Diagnosis Date Noted  . Osteoporosis 05/12/2013  . UTI (urinary tract infection) 03/28/2013  . Hyperlipidemia 01/19/2013  . Colon cancer screening 01/19/2013  . 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 ALLERGIC RHINITIS 09/10/2007  . GERD 09/10/2007  . Fibromyalgia 09/10/2007   Guadelupe Sabin, OTR/L  [phone] 07/22/2016, 4:20 PM  [facility] [address] [place], [place], [address] Phone:  [phone]   Fax:  [phone]  Name: Jennifer Hays MRN: [id number] Date of Birth: [birth year]

## 2016-07-24 ENCOUNTER — Encounter (HOSPITAL_COMMUNITY): Payer: Self-pay

## 2016-07-24 ENCOUNTER — Ambulatory Visit (HOSPITAL_COMMUNITY): Payer: BC Managed Care – PPO

## 2016-07-24 DIAGNOSIS — M25512 Pain in left shoulder: Secondary | ICD-10-CM | POA: Diagnosis not present

## 2016-07-24 DIAGNOSIS — R29898 Other symptoms and signs involving the musculoskeletal system: Secondary | ICD-10-CM | POA: Diagnosis not present

## 2016-07-24 NOTE — Therapy (Signed)
[place], [place], [address] Phone: [phone]   Fax:  [phone]  Occupational Therapy Treatment  Patient Details  Name: Jennifer Hays MRN: [id number] Date of Birth: 12-20-1945 Referring Provider: Dr. Sanjuana Kava  Encounter Date: 07/24/2016      OT End of Session - 07/24/16 1332    Visit Number 4   Number of Visits 8   Date for OT Re-Evaluation 08/16/16   Authorization Type Medicare   Authorization Time Period before 10th visit   Authorization - Visit Number 4   Authorization - Number of Visits 10   OT Start Time 1120   OT Stop Time 1204   OT Time Calculation (min) 44 min   Activity Tolerance Patient limited by pain   Behavior During Therapy [facility] for tasks assessed/performed      Past Medical History:  Diagnosis Date  . Allergy   . Asthma   . Atrophic vaginitis   . Osteopenia     Past Surgical History:  Procedure Laterality Date  . ABDOMINAL HYSTERECTOMY    . KNEE SURGERY     Right  . SPINE SURGERY      There were no vitals filed for this visit.      Subjective Assessment - 07/24/16 1325    Subjective  Pt reports that she did the tendon glides given to her at the last session but they caused her a lot of pain in her wirst when she completed the exercises.   Pertinent History --   Currently in Pain? Yes   Pain Score 8    Pain Location Arm   Pain Orientation Left   Pain Descriptors / Indicators Aching;Sore   Pain Type Acute pain   Pain Radiating Towards elbow and wrist   Pain Onset More than a month ago   Pain Frequency Intermittent   Aggravating Factors  Too much use/exercise    Pain Relieving Factors heat   Effect of Pain on Daily Activities decreased use of LUE   Multiple Pain Sites No   Pain Score --   Pain Location --   Pain Orientation --   Pain Descriptors / Indicators --   Pain Type --   Pain Onset --   Pain Frequency --   Aggravating Factors  --   Pain Relieving  Factors --   Effect of Pain on Daily Activities --            [facility] OT Assessment - 07/24/16 1423      Assessment   Diagnosis Left Arm Pain S/P Fall     Precautions   Precautions None                  OT Treatments/Exercises (OP) - 07/24/16 0001      Exercises   Exercises Shoulder;Elbow;Wrist     Shoulder Exercises: Supine   Protraction AROM;10 reps   Horizontal ABduction --   External Rotation AROM;10 reps   Internal Rotation AROM;10 reps   Flexion AROM;10 reps   ABduction --     Elbow Exercises   Elbow Extension AROM;10 reps;Supine   Forearm Supination AROM;10 reps;Supine   Forearm Pronation AROM;10 reps;Supine   Wrist Flexion AROM;10 reps;Supine   Wrist Extension AROM;10 reps   Other elbow exercises Elbow flexion AROM 10 reps supine     Modalities   Modalities Electrical Stimulation;Moist Heat     Moist Heat Therapy   Number Minutes Moist Heat 10 Minutes  Moist Heat Location Elbow;Shoulder     Electrical Stimulation   Electrical Stimulation Location Left elbow   Electrical Stimulation Action Interferential   Electrical Stimulation Parameters 7.4CV   Electrical Stimulation Goals Pain     Manual Therapy   Manual Therapy Myofascial release;Manual Traction   Manual therapy comments manual therapy compelted seperately from all other interventions this date   Myofascial Release Myofascial release and manual stretching to left upper arm, scapular region, and associated areas to decrease fascial restrictions and muscle knots that are causing decreased functional use of her left arm.                 OT Education - 07/24/16 1330    Education provided Yes   Education Details Pt educated to discontinue doing tendon glide exercises as they were causing her wrist pain.    Person(s) Educated Patient   Methods Explanation   Comprehension Verbalized understanding          OT Short Term Goals - 07/19/16 1113      OT SHORT TERM GOAL #1    Title Patient will be educated on HEP for improved left shoulder anad arm strength.   Time 4   Period Weeks   Status On-going     OT SHORT TERM GOAL #2   Title Patient will improve left arm strength to 5/5 for improved ability to lift clothes into overhead cabinets.   Time 4   Period Weeks   Status On-going     OT SHORT TERM GOAL #3   Title Patient will improve left grip stength 15 pounds and pinch strength by 2 pounds or more for greater ability to maintain grasp on items.     Time 4   Period Weeks   Status On-going     OT SHORT TERM GOAL #4   Title Patient will improve left arm sensation to 2.83 normal sensation for greater safety when completing ADLs.   Baseline 4   Period Weeks   Status On-going     OT SHORT TERM GOAL #5   Title Patient will decrease pain to 2/10 or better in left arm for graeater ability to  complete BADLs.    Time 4   Period Weeks   Status On-going                  Plan - 07/24/16 1334    Clinical Impression Statement A: Completed myofascial release for most of the session due to increased pain in shoulder and wrist. Pt used ES and moist heat and reported pain decrease from 8/10 to 4/10 after session. Removed tendon glides form HEP due to pain in wrist from this exercise.     Rehab Potential --   OT Frequency --   OT Duration --   OT Treatment/Interventions --   Plan P: Continue myofascial release throughout whole arm. Focus on pain control. Continue with A/ROM exercises.    OT Home Exercise Plan 07/24/16: removed tendon glides, continue with other exercises   Consulted and Agree with Plan of Care Patient      Patient will benefit from skilled therapeutic intervention in order to improve the following deficits and impairments:  Decreased strength, Increased fascial restricitons, Increased muscle spasms, Impaired UE functional use, Pain  Visit Diagnosis: Acute pain of left shoulder  Other symptoms and signs involving the musculoskeletal  system    Problem List Patient Active Problem List   Diagnosis Date Noted  . Osteoporosis 05/12/2013  . UTI (  urinary tract infection) 03/28/2013  . Hyperlipidemia 01/19/2013  . Colon cancer screening 01/19/2013  . 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 ALLERGIC RHINITIS 09/10/2007  . GERD 09/10/2007  . Fibromyalgia 09/10/2007    Ailene Ravel, OTR/L,CBIS  [phone]  07/24/2016, 2:32 PM  [facility] [address] [place], [place], [address] Phone: [phone]   Fax:  [phone]  Name: Jennifer Hays MRN: [id number] Date of Birth: Jul 21, 1945

## 2016-07-25 ENCOUNTER — Ambulatory Visit: Payer: BC Managed Care – PPO | Admitting: Orthopaedic Surgery

## 2016-07-30 ENCOUNTER — Ambulatory Visit (HOSPITAL_COMMUNITY): Payer: BC Managed Care – PPO | Admitting: Occupational Therapy

## 2016-07-31 ENCOUNTER — Ambulatory Visit (HOSPITAL_COMMUNITY): Payer: BC Managed Care – PPO | Admitting: Occupational Therapy

## 2016-07-31 ENCOUNTER — Encounter (HOSPITAL_COMMUNITY): Payer: Self-pay | Admitting: Occupational Therapy

## 2016-07-31 DIAGNOSIS — M25512 Pain in left shoulder: Secondary | ICD-10-CM | POA: Diagnosis not present

## 2016-07-31 DIAGNOSIS — R29898 Other symptoms and signs involving the musculoskeletal system: Secondary | ICD-10-CM | POA: Diagnosis not present

## 2016-07-31 NOTE — Patient Instructions (Signed)

## 2016-07-31 NOTE — Therapy (Signed)
[place], [place], [address] Phone: [phone]   Fax:  [phone]  Occupational Therapy Treatment  Patient Details  Name: Jennifer Hays MRN: [id number] Date of Birth: [birth year] Referring Provider: Dr. Sanjuana Kava  Encounter Date: 07/31/2016      OT End of Session - 07/31/16 1204    Visit Number 5   Number of Visits 8   Date for OT Re-Evaluation 08/16/16   Authorization Type Medicare   Authorization Time Period before 10th visit   Authorization - Visit Number 5   Authorization - Number of Visits 10   OT Start Time 1115   OT Stop Time 1158   OT Time Calculation (min) 43 min   Activity Tolerance Patient limited by pain   Behavior During Therapy [facility] for tasks assessed/performed      Past Medical History:  Diagnosis Date  . Allergy   . Asthma   . Atrophic vaginitis   . Osteopenia     Past Surgical History:  Procedure Laterality Date  . ABDOMINAL HYSTERECTOMY    . KNEE SURGERY     Right  . SPINE SURGERY      There were no vitals filed for this visit.      Subjective Assessment - 07/31/16 1115    Subjective  S: I've been doing those hand exercises but only 2 or 3 and I think it's helping. (tendon glides)   Currently in Pain? Yes   Pain Score 7    Pain Location Arm   Pain Orientation Left   Pain Descriptors / Indicators Aching   Pain Type Acute pain   Pain Radiating Towards elbow, wrist   Pain Onset More than a month ago   Pain Frequency Intermittent   Aggravating Factors  movement, too much use   Pain Relieving Factors heat   Effect of Pain on Daily Activities decreased use of LUE   Multiple Pain Sites Yes   Pain Score 5   Pain Location Wrist   Pain Orientation Left   Pain Descriptors / Indicators Aching   Pain Type Acute pain   Pain Radiating Towards none   Pain Onset More than a month ago   Pain Frequency Intermittent   Aggravating Factors  see above   Pain Relieving Factors  see above   Effect of Pain on Daily Activities see above            [facility] OT Assessment - 07/31/16 1114      Assessment   Diagnosis Left Arm Pain S/P Fall     Precautions   Precautions None                  OT Treatments/Exercises (OP) - 07/31/16 1120      Exercises   Exercises Shoulder;Elbow;Wrist     Shoulder Exercises: Supine   Protraction AROM;10 reps   Horizontal ABduction AROM;10 reps   External Rotation AROM;10 reps   Internal Rotation AROM;10 reps   Flexion AROM;10 reps   ABduction AROM;10 reps     Shoulder Exercises: Seated   External Rotation Theraband;10 reps   Theraband Level (Shoulder External Rotation) Level 2 (Red)   Internal Rotation Theraband;10 reps   Theraband Level (Shoulder Internal Rotation) Level 2 (Red)   Flexion AROM;10 reps     Shoulder Exercises: Standing   Extension Theraband;10 reps   Theraband Level (Shoulder Extension) Level 2 (Red)   Row Theraband;10 reps   Theraband Level (Shoulder Row) Level  2 (Red)   Retraction Theraband;10 reps   Theraband Level (Shoulder Retraction) Level 2 (Red)     Shoulder Exercises: ROM/Strengthening   X to V Arms 10X   Proximal Shoulder Strengthening, Supine 10 times     Manual Therapy   Manual Therapy Myofascial release;Manual Traction   Manual therapy comments manual therapy compelted seperately from all other interventions this date   Myofascial Release Myofascial release and manual stretching to left upper arm, scapular region, and associated areas to decrease fascial restrictions and muscle knots that are causing decreased functional use of her left arm.                 OT Education - 07/31/16 1140    Education provided Yes   Education Details A/ROM exercises   Person(s) Educated Patient   Methods Explanation;Demonstration;Handout   Comprehension Verbalized understanding;Returned demonstration          OT Short Term Goals - 07/19/16 1113      OT SHORT TERM GOAL #1    Title Patient will be educated on HEP for improved left shoulder anad arm strength.   Time 4   Period Weeks   Status On-going     OT SHORT TERM GOAL #2   Title Patient will improve left arm strength to 5/5 for improved ability to lift clothes into overhead cabinets.   Time 4   Period Weeks   Status On-going     OT SHORT TERM GOAL #3   Title Patient will improve left grip stength 15 pounds and pinch strength by 2 pounds or more for greater ability to maintain grasp on items.     Time 4   Period Weeks   Status On-going     OT SHORT TERM GOAL #4   Title Patient will improve left arm sensation to 2.83 normal sensation for greater safety when completing ADLs.   Baseline 4   Period Weeks   Status On-going     OT SHORT TERM GOAL #5   Title Patient will decrease pain to 2/10 or better in left arm for graeater ability to  complete BADLs.    Time 4   Period Weeks   Status On-going                  Plan - 07/31/16 1204    Clinical Impression Statement A: Pt reports she did continue completing tendon glides, only completing 2 or 3 at a time and she believes it has helped. Completed A/ROM supine, during abduction pt reports increased pain in upper arm and medial deltoid regions. Complete scapular theraband with minimal discomfort.    OT Home Exercise Plan 07/24/16: removed tendon glides, continue with other exercises   Consulted and Agree with Plan of Care Patient   Plan P: Continue with manual therapy, attempt to complete A/ROM in sidelying or sitting this session      Patient will benefit from skilled therapeutic intervention in order to improve the following deficits and impairments:  Decreased strength, Increased fascial restricitons, Increased muscle spasms, Impaired UE functional use, Pain  Visit Diagnosis: Acute pain of left shoulder  Other symptoms and signs involving the musculoskeletal system    Problem List Patient Active Problem List   Diagnosis Date Noted   . Osteoporosis 05/12/2013  . UTI (urinary tract infection) 03/28/2013  . Hyperlipidemia 01/19/2013  . Colon cancer screening 01/19/2013  . 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 ALLERGIC RHINITIS 09/10/2007  . GERD 09/10/2007  . Fibromyalgia 09/10/2007  Guadelupe Sabin, OTR/L  [phone] 07/31/2016, 12:07 PM  [facility] [address] [place], [place], [address] Phone: [phone]   Fax:  [phone]  Name: Jennifer Hays MRN: [id number] Date of Birth: 05/01/1945

## 2016-08-01 ENCOUNTER — Ambulatory Visit: Payer: BC Managed Care – PPO | Admitting: Orthopaedic Surgery

## 2016-08-01 ENCOUNTER — Ambulatory Visit (HOSPITAL_COMMUNITY): Payer: BC Managed Care – PPO | Admitting: Occupational Therapy

## 2016-08-01 ENCOUNTER — Encounter (HOSPITAL_COMMUNITY): Payer: Self-pay | Admitting: Occupational Therapy

## 2016-08-01 DIAGNOSIS — M25512 Pain in left shoulder: Secondary | ICD-10-CM

## 2016-08-01 DIAGNOSIS — R29898 Other symptoms and signs involving the musculoskeletal system: Secondary | ICD-10-CM

## 2016-08-01 NOTE — Therapy (Signed)
[place], [place], [address] Phone: [phone]   Fax:  [phone]  Occupational Therapy Treatment  Patient Details  Name: Jennifer Hays MRN: [id number] Date of Birth: August 14, 1945 Referring Provider: Dr. Sanjuana Kava  Encounter Date: 08/01/2016      OT End of Session - 08/01/16 1439    Visit Number 6   Number of Visits 8   Date for OT Re-Evaluation 08/16/16   Authorization Type Medicare   Authorization Time Period before 10th visit   Authorization - Visit Number 6   Authorization - Number of Visits 10   OT Start Time 6962   OT Stop Time 1430   OT Time Calculation (min) 42 min   Activity Tolerance Patient limited by pain   Behavior During Therapy [facility] for tasks assessed/performed      Past Medical History:  Diagnosis Date  . Allergy   . Asthma   . Atrophic vaginitis   . Osteopenia     Past Surgical History:  Procedure Laterality Date  . ABDOMINAL HYSTERECTOMY    . KNEE SURGERY     Right  . SPINE SURGERY      There were no vitals filed for this visit.      Subjective Assessment - 08/01/16 1344    Subjective  S: I got up and did some exercises last night.    Currently in Pain? Yes   Pain Score 5    Pain Location Arm   Pain Orientation Left   Pain Descriptors / Indicators Aching   Pain Type Acute pain   Pain Radiating Towards elbow, wrist   Pain Onset More than a month ago   Pain Frequency Intermittent   Aggravating Factors  movement, too much use   Pain Relieving Factors heat   Effect of Pain on Daily Activities decreased use of LUE during daily tasks   Multiple Pain Sites No            [facility] OT Assessment - 08/01/16 1344      Assessment   Diagnosis Left Arm Pain S/P Fall     Precautions   Precautions None                  OT Treatments/Exercises (OP) - 08/01/16 1350      Exercises   Exercises Shoulder;Elbow;Wrist     Shoulder Exercises: Supine   Protraction AROM;10 reps   Horizontal ABduction AROM;10 reps   External Rotation AROM;10 reps   Internal Rotation AROM;10 reps   Flexion AROM;10 reps   ABduction AROM;10 reps     Shoulder Exercises: Seated   External Rotation Theraband;10 reps   Theraband Level (Shoulder External Rotation) Level 2 (Red)   Internal Rotation Theraband;10 reps   Theraband Level (Shoulder Internal Rotation) Level 2 (Red)     Shoulder Exercises: Standing   Extension Theraband;10 reps   Theraband Level (Shoulder Extension) Level 2 (Red)   Row Theraband;10 reps   Theraband Level (Shoulder Row) Level 2 (Red)   Retraction Theraband;10 reps   Theraband Level (Shoulder Retraction) Level 2 (Red)     Shoulder Exercises: ROM/Strengthening   Ball on Wall 1' flexion     Elbow Exercises   Theraband Level (Elbow Flexion) Level 2 (Red)   Elbow Extension Theraband;10 reps   Theraband Level (Elbow Extension) Level 2 (Red)   Forearm Supination Strengthening;10 reps  2# weight   Forearm Pronation Strengthening;10 reps  2# weight   Other elbow exercises  Elbow flexion theraband, 10 reps     Manual Therapy   Manual Therapy Myofascial release   Manual therapy comments manual therapy compelted seperately from all other interventions this date   Myofascial Release Myofascial release and manual stretching to left upper arm, scapular region, and associated areas to decrease fascial restrictions and muscle knots that are causing decreased functional use of her left arm.                 OT Education - 07/31/16 1140    Education provided Yes   Education Details A/ROM exercises   Person(s) Educated Patient   Methods Explanation;Demonstration;Handout   Comprehension Verbalized understanding;Returned demonstration          OT Short Term Goals - 07/19/16 1113      OT SHORT TERM GOAL #1   Title Patient will be educated on HEP for improved left shoulder anad arm strength.   Time 4   Period Weeks   Status  On-going     OT SHORT TERM GOAL #2   Title Patient will improve left arm strength to 5/5 for improved ability to lift clothes into overhead cabinets.   Time 4   Period Weeks   Status On-going     OT SHORT TERM GOAL #3   Title Patient will improve left grip stength 15 pounds and pinch strength by 2 pounds or more for greater ability to maintain grasp on items.     Time 4   Period Weeks   Status On-going     OT SHORT TERM GOAL #4   Title Patient will improve left arm sensation to 2.83 normal sensation for greater safety when completing ADLs.   Baseline 4   Period Weeks   Status On-going     OT SHORT TERM GOAL #5   Title Patient will decrease pain to 2/10 or better in left arm for graeater ability to  complete BADLs.    Time 4   Period Weeks   Status On-going                  Plan - 08/01/16 1440    Clinical Impression Statement A: Pt reports pain along upper arm and lateral delotid this session, also reporting she has not used her arm very much today. Added A/ROM in sidelying, ball on wall, and elbow strengthening tasks this session. OT notes pt place pocketbook on left shoulder and carry with no visible signs of discomfort.    Plan P: Add elbow and forearm strengthening to HEP if pt able to complete with good form. Attempt session without manual therapy as pt reports it increases her pain   OT Home Exercise Plan 07/24/16: removed tendon glides, continue with other exercises   Consulted and Agree with Plan of Care Patient      Patient will benefit from skilled therapeutic intervention in order to improve the following deficits and impairments:  Decreased strength, Increased fascial restricitons, Increased muscle spasms, Impaired UE functional use, Pain  Visit Diagnosis: Acute pain of left shoulder  Other symptoms and signs involving the musculoskeletal system    Problem List Patient Active Problem List   Diagnosis Date Noted  . Osteoporosis 05/12/2013  . UTI  (urinary tract infection) 03/28/2013  . Hyperlipidemia 01/19/2013  . Colon cancer screening 01/19/2013  . 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 ALLERGIC RHINITIS 09/10/2007  . GERD 09/10/2007  . Fibromyalgia 09/10/2007   Guadelupe Sabin, OTR/L  [phone] 08/01/2016, 2:45 PM  [place]  [place] [address] [place], [place], [address] Phone: [phone]   Fax:  [phone]  Name: Jennifer Hays MRN: [id number] Date of Birth: June 01, 1945

## 2016-08-05 ENCOUNTER — Ambulatory Visit (HOSPITAL_COMMUNITY): Payer: BC Managed Care – PPO | Attending: Orthopaedic Surgery

## 2016-08-05 DIAGNOSIS — M25512 Pain in left shoulder: Secondary | ICD-10-CM | POA: Diagnosis present

## 2016-08-05 DIAGNOSIS — R29898 Other symptoms and signs involving the musculoskeletal system: Secondary | ICD-10-CM

## 2016-08-05 NOTE — Patient Instructions (Signed)
BICEP CURLS  With your arm at your side, draw up your hand by bending at the elbow.   Keep your palm face up the entire time. Complete 12-15 times. 2 times a day.    FREE WEIGHT SUPINATION AND PRONATION  Rest your forearm on your knee or a table. Next, while holding the end of a small weight, slowly lower the weight towards the outside and then rotate your forearm towards the inside of your body as shown.  Complete 12-15 times. 2 times a day.

## 2016-08-06 ENCOUNTER — Encounter (HOSPITAL_COMMUNITY): Payer: Self-pay

## 2016-08-06 ENCOUNTER — Other Ambulatory Visit: Payer: Self-pay | Admitting: Family Medicine

## 2016-08-06 NOTE — Therapy (Signed)
[place], [place], [address] Phone: [phone]   Fax:  [phone]  Occupational Therapy Treatment  Patient Details  Name: Jennifer Hays MRN: [id number] Date of Birth: 12/11/1945 Referring Provider: Dr. Sanjuana Kava  Encounter Date: 08/05/2016      OT End of Session - 08/06/16 0830    Visit Number 7   Number of Visits 8   Date for OT Re-Evaluation 08/16/16   Authorization Type Medicare   Authorization Time Period before 10th visit   Authorization - Visit Number 7   Authorization - Number of Visits 10   OT Start Time 1117   OT Stop Time 1200   OT Time Calculation (min) 43 min   Activity Tolerance Patient tolerated treatment well   Behavior During Therapy [facility] for tasks assessed/performed      Past Medical History:  Diagnosis Date  . Allergy   . Asthma   . Atrophic vaginitis   . Osteopenia     Past Surgical History:  Procedure Laterality Date  . ABDOMINAL HYSTERECTOMY    . KNEE SURGERY     Right  . SPINE SURGERY      There were no vitals filed for this visit.      Subjective Assessment - 08/05/16 1115    Subjective  S: When I took a break from doing my house cleaning my arm felt a lot better but then I started cleaning again and it hurts now.   Currently in Pain? Yes   Pain Score 6    Pain Location Arm   Pain Orientation Left   Pain Descriptors / Indicators Aching;Constant   Pain Type Acute pain   Pain Radiating Towards elbow; wrist   Pain Onset More than a month ago   Pain Frequency Constant   Aggravating Factors  movement, too much use   Pain Relieving Factors heat   Effect of Pain on Daily Activities decreased use of LUE during daily tasks   Multiple Pain Sites No            [facility] OT Assessment - 08/05/16 1126      Assessment   Diagnosis Left Arm Pain S/P Fall     Precautions   Precautions None                  OT Treatments/Exercises (OP) - 08/05/16 1124      Exercises   Exercises Shoulder;Elbow;Wrist     Shoulder Exercises: Supine   Protraction AROM;5 reps   Horizontal ABduction AROM;5 reps   External Rotation AROM;5 reps   Internal Rotation AROM;5 reps   Flexion AROM;5 reps   ABduction AROM;5 reps     Shoulder Exercises: Standing   Protraction Strengthening;10 reps   Protraction Weight (lbs) 1   Horizontal ABduction Strengthening;10 reps   Horizontal ABduction Weight (lbs) 1   External Rotation Theraband;12 reps;Strengthening;10 reps   Theraband Level (Shoulder External Rotation) Level 2 (Red)   External Rotation Weight (lbs) 1   Internal Rotation Theraband;12 reps;Strengthening;10 reps   Theraband Level (Shoulder Internal Rotation) Level 2 (Red)   Internal Rotation Weight (lbs) 1   Flexion Strengthening;10 reps   Shoulder Flexion Weight (lbs) 1   ABduction Strengthening;10 reps   Shoulder ABduction Weight (lbs) 1   Extension Theraband;12 reps   Theraband Level (Shoulder Extension) Level 2 (Red)   Row Theraband;12 reps   Theraband Level (Shoulder Row) Level 2 (Red)   Retraction Theraband;12 reps   Theraband  Level (Shoulder Retraction) Level 2 (Red)     Shoulder Exercises: ROM/Strengthening   UBE (Upper Arm Bike) 2' forward; 2' backwards, level 1   "W" Arms 10X   X to V Arms 10X   Proximal Shoulder Strengthening, Supine 10 times   Ball on Wall 1' flexion     Elbow Exercises   Elbow Extension Strengthening  12   Bar Weights/Barbell (Elbow Extension) 2 lbs   Forearm Supination Strengthening  12X; 2#   Forearm Pronation Strengthening  12X; 2#   Other elbow exercises Elbow flexion; 2#; 12X                OT Education - 08/06/16 0829    Education provided Yes   Education Details strengthening for elbow and forearm with weight were provided   Person(s) Educated Patient   Methods Explanation;Demonstration;Handout;Verbal cues   Comprehension Verbalized understanding;Returned demonstration          OT  Short Term Goals - 07/19/16 1113      OT SHORT TERM GOAL #1   Title Patient will be educated on HEP for improved left shoulder anad arm strength.   Time 4   Period Weeks   Status On-going     OT SHORT TERM GOAL #2   Title Patient will improve left arm strength to 5/5 for improved ability to lift clothes into overhead cabinets.   Time 4   Period Weeks   Status On-going     OT SHORT TERM GOAL #3   Title Patient will improve left grip stength 15 pounds and pinch strength by 2 pounds or more for greater ability to maintain grasp on items.     Time 4   Period Weeks   Status On-going     OT SHORT TERM GOAL #4   Title Patient will improve left arm sensation to 2.83 normal sensation for greater safety when completing ADLs.   Baseline 4   Period Weeks   Status On-going     OT SHORT TERM GOAL #5   Title Patient will decrease pain to 2/10 or better in left arm for graeater ability to  complete BADLs.    Time 4   Period Weeks   Status On-going                  Plan - 08/06/16 0831    Clinical Impression Statement A: Pt reports increased pain in left arm due to household cleaning. Added strengthening with weight for elbow and forearm.. Added W arms. VC for form and technique.   Plan P: update G code. Continue with shoulder and elbow strengthening as patient tolerates with decreased pain. Focus on shoulder stability that's needed for ADL tasks.   OT Home Exercise Plan 07/24/16: removed tendon glides, continue with other exercises; 08/06/16: added strengthening exercises with 2# for elbow and forearm   Consulted and Agree with Plan of Care Patient      Patient will benefit from skilled therapeutic intervention in order to improve the following deficits and impairments:  Decreased strength, Increased fascial restricitons, Increased muscle spasms, Impaired UE functional use, Pain  Visit Diagnosis: Acute pain of left shoulder  Other symptoms and signs involving the musculoskeletal  system    Problem List Patient Active Problem List   Diagnosis Date Noted  . Osteoporosis 05/12/2013  . UTI (urinary tract infection) 03/28/2013  . Hyperlipidemia 01/19/2013  . Colon cancer screening 01/19/2013  . 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 ALLERGIC RHINITIS 09/10/2007  . GERD  09/10/2007  . Fibromyalgia 09/10/2007   Ailene Ravel, OTR/L,CBIS  [phone]  08/06/2016, 11:34 AM  [place] [address] [place], [place], [address] Phone: [phone]   Fax:  [phone]  Name: Jennifer Hays MRN: [id number] Date of Birth: [birth year]

## 2016-08-07 ENCOUNTER — Encounter (HOSPITAL_COMMUNITY): Payer: Self-pay | Admitting: Occupational Therapy

## 2016-08-07 ENCOUNTER — Ambulatory Visit (INDEPENDENT_AMBULATORY_CARE_PROVIDER_SITE_OTHER): Payer: BC Managed Care – PPO | Admitting: Orthopaedic Surgery

## 2016-08-07 ENCOUNTER — Ambulatory Visit (HOSPITAL_COMMUNITY): Payer: BC Managed Care – PPO | Admitting: Occupational Therapy

## 2016-08-07 ENCOUNTER — Encounter: Payer: Self-pay | Admitting: Orthopaedic Surgery

## 2016-08-07 VITALS — BP 111/69 | HR 70 | Temp 97.7°F | Ht 68.0 in | Wt 163.0 lb

## 2016-08-07 DIAGNOSIS — M25512 Pain in left shoulder: Secondary | ICD-10-CM | POA: Diagnosis not present

## 2016-08-07 DIAGNOSIS — R29898 Other symptoms and signs involving the musculoskeletal system: Secondary | ICD-10-CM

## 2016-08-07 NOTE — Therapy (Signed)
[place], [place], [address] Phone: [phone]   Fax:  [phone]  Occupational Therapy Reassessment & Treatment (recertification)  Patient Details  Name: Jennifer Hays MRN: [id number] Date of Birth: [birth year] Referring Provider: Dr. Sanjuana Kava  Encounter Date: 08/07/2016      OT End of Session - 08/07/16 1212    Visit Number 8   Number of Visits 12   Date for OT Re-Evaluation 08/23/16   Authorization Type Medicare   Authorization Time Period before 10th visit   Authorization - Visit Number 8   Authorization - Number of Visits 10   OT Start Time 1118   OT Stop Time 1200   OT Time Calculation (min) 42 min   Activity Tolerance Patient tolerated treatment well   Behavior During Therapy [facility] for tasks assessed/performed      Past Medical History:  Diagnosis Date  . Allergy   . Asthma   . Atrophic vaginitis   . Osteopenia     Past Surgical History:  Procedure Laterality Date  . ABDOMINAL HYSTERECTOMY    . KNEE SURGERY     Right  . SPINE SURGERY      There were no vitals filed for this visit.      Subjective Assessment - 08/07/16 1122    Subjective  S: I think I'm getting stronger.    Currently in Pain? Yes   Pain Score 6    Pain Location Arm   Pain Orientation Left   Pain Descriptors / Indicators Aching;Constant   Pain Type Acute pain   Pain Radiating Towards elbow, wrist   Pain Onset More than a month ago   Pain Frequency Constant   Aggravating Factors  movement, too much use   Pain Relieving Factors heat   Effect of Pain on Daily Activities decreased use of LUE during daily tasks   Multiple Pain Sites No            [facility] OT Assessment - 08/07/16 1119      Assessment   Diagnosis Left Arm Pain S/P Fall     Precautions   Precautions None     Palpation   Palpation comment mod fascial restrictions noted in left upper arm, scapular, trapezius region      AROM   Overall  AROM Comments LUE A/ROM is WNL   AROM Assessment Site --   Right/Left Shoulder --     PROM   Overall PROM Comments LUE P/ROM is WNL     Strength   Strength Assessment Site Shoulder   Right/Left Shoulder Left   Left Shoulder Flexion 4-/5  same as previous   Left Shoulder ABduction 4/5  4-/5 previous   Left Shoulder Internal Rotation 4/5  4-/5 previous   Left Shoulder External Rotation 4-/5  same as previous                  OT Treatments/Exercises (OP) - 08/07/16 1123      Exercises   Exercises Shoulder;Elbow;Wrist     Shoulder Exercises: Supine   Protraction Strengthening;10 reps   Protraction Weight (lbs) 1   Horizontal ABduction Strengthening;10 reps   Horizontal ABduction Weight (lbs) 1   External Rotation Strengthening;10 reps   External Rotation Weight (lbs) 1   Internal Rotation Strengthening;10 reps   Internal Rotation Weight (lbs) 1   Flexion Strengthening;10 reps   Shoulder Flexion Weight (lbs) 1   ABduction Strengthening;10 reps   Shoulder ABduction Weight (  lbs) 1     Shoulder Exercises: Standing   Extension Theraband;12 reps   Theraband Level (Shoulder Extension) Level 2 (Red)   Row Theraband;12 reps   Theraband Level (Shoulder Row) Level 2 (Red)   Retraction Theraband;12 reps   Theraband Level (Shoulder Retraction) Level 2 (Red)     Shoulder Exercises: ROM/Strengthening   X to V Arms 10X   Proximal Shoulder Strengthening, Seated 10 times   Ball on Wall 1' flexion     Elbow Exercises   Elbow Extension Strengthening  12   Bar Weights/Barbell (Elbow Extension) 2 lbs   Forearm Supination Strengthening  12X, 2#   Forearm Pronation Strengthening  12X, 2#   Other elbow exercises Elbow flexion; 2#; 12X     Manual Therapy   Manual Therapy Myofascial release   Manual therapy comments manual therapy compelted seperately from all other interventions this date   Myofascial Release Myofascial release and manual stretching to left upper arm,  scapular region, and associated areas to decrease fascial restrictions and muscle knots that are causing decreased functional use of her left arm.                 OT Education - 08/06/16 [phone]    Education provided Yes   Education Details strengthening for elbow and forearm with weight were provided   Person(s) Educated Patient   Methods Explanation;Demonstration;Handout;Verbal cues   Comprehension Verbalized understanding;Returned demonstration          OT Short Term Goals - 08/07/16 1212      OT SHORT TERM GOAL #1   Title Patient will be educated on HEP for improved left shoulder anad arm strength.   Time 4   Period Weeks   Status On-going     OT SHORT TERM GOAL #2   Title Patient will improve left arm strength to 5/5 for improved ability to lift clothes into overhead cabinets.   Time 4   Period Weeks   Status On-going     OT SHORT TERM GOAL #3   Title Patient will improve left grip stength 15 pounds and pinch strength by 2 pounds or more for greater ability to maintain grasp on items.     Time 4   Period Weeks   Status On-going     OT SHORT TERM GOAL #4   Title Patient will improve left arm sensation to 2.83 normal sensation for greater safety when completing ADLs.   Baseline 4   Period Weeks   Status On-going     OT SHORT TERM GOAL #5   Title Patient will decrease pain to 2/10 or better in left arm for graeater ability to  complete BADLs.    Time 4   Period Weeks   Status On-going                  Plan - 08/07/16 1213    Clinical Impression Statement A: Reassessment completed this session, no goals met thus far however pt continues to display P/ROM and A/ROM WNL, strength has marginally improved with shoulder abduction and IR. Pt reports she is able to use her arm more, however fatigues easily and requires rest breaks often. Pt has progressed to light strengthening exercises during OT sessions, reporting increased fatigue and some discomfort  along upper arm and down to elbow. Resumed myofascial release this session as pt reports improved pain levels compared to session without manual therapy. Recommend continuing OT services 2x/week for 2 additional weeks focusing on RUE strengthening  and adding grip/pinch strengthening exercises.    Rehab Potential Good   OT Frequency 2x / week   OT Duration 2 weeks   OT Treatment/Interventions Self-care/ADL training;Cryotherapy;Ultrasound;Electrical Stimulation;Moist Heat;Iontophoresis;Neuromuscular education;Therapeutic exercise;Manual Therapy;Passive range of motion;Therapeutic exercises;Therapeutic activities;Patient/family education   Plan P: Follow up on MD appt, focus sessions on shoulder strengthening and stability, measure grip and pinch strength and add exercises as appropriate. Complete DASH and update G-Code   OT Home Exercise Plan 07/24/16: removed tendon glides, continue with other exercises; 08/06/16: added strengthening exercises with 2# for elbow and forearm   Consulted and Agree with Plan of Care Patient      Patient will benefit from skilled therapeutic intervention in order to improve the following deficits and impairments:  Decreased strength, Increased fascial restricitons, Increased muscle spasms, Impaired UE functional use, Pain  Visit Diagnosis: Acute pain of left shoulder  Other symptoms and signs involving the musculoskeletal system    Problem List Patient Active Problem List   Diagnosis Date Noted  . Osteoporosis 05/12/2013  . UTI (urinary tract infection) 03/28/2013  . Hyperlipidemia 01/19/2013  . Colon cancer screening 01/19/2013  . 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 ALLERGIC RHINITIS 09/10/2007  . GERD 09/10/2007  . Fibromyalgia 09/10/2007   Guadelupe Sabin, OTR/L  [phone] 08/07/2016, 12:19 PM  [facility] [address] [place], [place], [address] Phone: [phone]   Fax:  [phone]  Name: Jennifer Hays MRN: [id number] Date of Birth: [birth year]

## 2016-08-07 NOTE — Progress Notes (Signed)
Patient Jennifer Hays, female DOB:[birth year], 71 y.o. ION:[id number]  Chief Complaint  Patient presents with  . Follow-up    left shoulder and elbow pain    HPI  Jennifer Hays is a 72 y.o. female who has left shoulder pain.  She is going to OT and is improved.  She is doing her exercises at home.  She is making good progress.  She has much less pain.  She has no paresthesias. HPI  Body mass index is 24.78 kg/m.  ROS  Review of Systems  HENT: Negative for congestion.   Respiratory: Negative for cough and shortness of breath.   Cardiovascular: Negative for chest pain and leg swelling.  Endocrine: Positive for cold intolerance.  Musculoskeletal: Positive for arthralgias.  Allergic/Immunologic: Positive for environmental allergies.    Past Medical History:  Diagnosis Date  . Allergy   . Asthma   . Atrophic vaginitis   . Osteopenia     Past Surgical History:  Procedure Laterality Date  . ABDOMINAL HYSTERECTOMY    . KNEE SURGERY     Right  . SPINE SURGERY      No family history on file.  Social History Social History  Substance Use Topics  . Smoking status: Former Research scientist (life sciences)  . Smokeless tobacco: Never Used     Comment: quit 1970's  . Alcohol use No    Allergies  Allergen Reactions  . Clindamycin/Lincomycin     Itching   . Penicillins     REACTION: rash, itching, difficulty breathing    Current Outpatient Prescriptions  Medication Sig Dispense Refill  . ADVAIR DISKUS 250-50 MCG/DOSE AEPB INHALE 1 PUFF INTO THE LUNGS 2 TIMES DAILY. 60 each 0  . albuterol (PROVENTIL HFA;VENTOLIN HFA) 108 (90 BASE) MCG/ACT inhaler Inhale 2 puffs into the lungs every 4 (four) hours as needed for wheezing or shortness of breath. 1 Inhaler 3  . albuterol (PROVENTIL) (2.5 MG/3ML) 0.083% nebulizer solution Take 3 mLs (2.5 mg total) by nebulization every 6 (six) hours as needed for wheezing. 150 mL 6  . alendronate (FOSAMAX) 70 MG tablet TAKE 1 TABLET BY MOUTH ONCE A WEEK. TAKE WITH  A FULL GLASS OF WATER ON AN EMPTY STOMACH. 4 tablet 0  . Ascorbic Acid (VITAMIN C) 1000 MG tablet Take 500 mg by mouth daily.     . budesonide (PULMICORT) 1 MG/2ML nebulizer solution Take 2 mLs (1 mg total) by nebulization daily. 120 mL 3  . Calcium Carbonate-Vitamin D (CALCIUM 600/VITAMIN D) 600-400 MG-UNIT per tablet Take 1 tablet by mouth 2 (two) times daily.    . cetirizine (ZYRTEC) 10 MG tablet Take 10 mg by mouth daily.    . clobetasol cream (TEMOVATE) 2.44 % Apply 1 application topically 2 (two) times daily. (Patient not taking: Reported on 07/17/2016) 45 g 1  . fenofibrate 54 MG tablet TAKE 1 TABLET (54 MG TOTAL) BY MOUTH DAILY. (Patient not taking: Reported on 07/17/2016) 90 tablet 1  . HYDROcodone-acetaminophen (NORCO/VICODIN) 5-325 MG per tablet Take 1 tablet by mouth every 6 (six) hours as needed for moderate pain.    Marland Kitchen lidocaine (LIDODERM) 5 % Place 1 patch onto the skin daily. Remove & Discard patch within 12 hours or as directed by MD    . Meth-Hyo-M Bl-Na Phos-Ph Sal (URIBEL) 118 MG CAPS Take as needed 120 capsule   . Multiple Vitamin (MULTIVITAMIN) tablet Take 1 tablet by mouth daily.    . sodium chloride (OCEAN) 0.65 % SOLN nasal spray Place 1 spray into  both nostrils as needed for congestion.    . tizanidine (ZANAFLEX) 2 MG capsule   3   No current facility-administered medications for this visit.      Physical Exam  Blood pressure 111/69, pulse 70, temperature 97.7 F (36.5 C), height 5\' 8"  (1.727 m), weight 163 lb (73.9 kg).  Constitutional: overall normal hygiene, normal nutrition, well developed, normal grooming, normal body habitus. Assistive device:none  Musculoskeletal: gait and station Limp none, muscle tone and strength are normal, no tremors or atrophy is present.  .  Neurological: coordination overall normal.  Deep tendon reflex/nerve stretch intact.  Sensation normal.  Cranial nerves II-XII intact.   Skin:   Normal overall no scars, lesions, ulcers or  rashes. No psoriasis.  Psychiatric: Alert and oriented x 3.  Recent memory intact, remote memory unclear.  Normal mood and affect. Well groomed.  Good eye contact.  Cardiovascular: overall no swelling, no varicosities, no edema bilaterally, normal temperatures of the legs and arms, no clubbing, cyanosis and good capillary refill.  Lymphatic: palpation is normal.  Her left shoulder has near full ROM but tenderness in the extremes.  NV intact.  Her neck is normal.  The patient has been educated about the nature of the problem(s) and counseled on treatment options.  The patient appeared to understand what I have discussed and is in agreement with it.  Encounter Diagnosis  Name Primary?  . Pain in joint of left shoulder Yes    PLAN Call if any problems.  Precautions discussed.  Continue current medications.   Return to clinic 3 weeks   Continue OT  Electronically Signed Sanjuana Kava, MD 6/6/20181:49 PM

## 2016-08-12 ENCOUNTER — Encounter (HOSPITAL_COMMUNITY): Payer: Self-pay | Admitting: Occupational Therapy

## 2016-08-12 ENCOUNTER — Ambulatory Visit (HOSPITAL_COMMUNITY): Payer: BC Managed Care – PPO | Admitting: Occupational Therapy

## 2016-08-12 DIAGNOSIS — M25512 Pain in left shoulder: Secondary | ICD-10-CM | POA: Diagnosis not present

## 2016-08-12 DIAGNOSIS — R29898 Other symptoms and signs involving the musculoskeletal system: Secondary | ICD-10-CM

## 2016-08-12 NOTE — Therapy (Signed)
[facility] [place], [place], [address] Phone: [phone]   Fax:  [phone]  Occupational Therapy Treatment  Patient Details  Name: Jennifer Hays MRN: [id number] Date of Birth: [birth year] Referring Provider: Dr. Sanjuana Kava  Encounter Date: 08/12/2016      OT End of Session - 08/12/16 1211    Visit Number 9   Number of Visits 12   Date for OT Re-Evaluation 08/23/16   Authorization Type Medicare   Authorization Time Period before 19th visit   Authorization - Visit Number 9   Authorization - Number of Visits 19   OT Start Time 1117   OT Stop Time 1201   OT Time Calculation (min) 44 min   Activity Tolerance Patient tolerated treatment well   Behavior During Therapy [facility] for tasks assessed/performed      Past Medical History:  Diagnosis Date  . Allergy   . Asthma   . Atrophic vaginitis   . Osteopenia     Past Surgical History:  Procedure Laterality Date  . ABDOMINAL HYSTERECTOMY    . KNEE SURGERY     Right  . SPINE SURGERY      There were no vitals filed for this visit.      Subjective Assessment - 08/12/16 1117    Subjective  S: The doctor said he'd like me to come for 3 more weeks.    Currently in Pain? Yes   Pain Score 5    Pain Location Arm   Pain Orientation Left   Pain Descriptors / Indicators Aching;Constant   Pain Type Acute pain   Pain Radiating Towards elbow, shoulder   Pain Onset More than a month ago   Pain Frequency Constant   Aggravating Factors  movement, too much use   Pain Relieving Factors heat   Effect of Pain on Daily Activities decrease use of LUE during daily tasks   Multiple Pain Sites No            [facility] OT Assessment - 08/12/16 1117      Assessment   Diagnosis Left Arm Pain S/P Fall     Precautions   Precautions None     Strength   Strength Assessment Site Hand   Right/Left hand Right;Left   Right Hand Grip (lbs) 42   Right Hand Lateral Pinch 10 lbs    Right Hand 3 Point Pinch 10 lbs   Left Hand Grip (lbs) 40   Left Hand Lateral Pinch 10 lbs   Left Hand 3 Point Pinch 10 lbs            [name] - 08/12/16 1141    Open a tight or new jar Unable   Do heavy household chores (wash walls, wash floors) Severe difficulty   Carry a shopping bag or briefcase No difficulty   Wash your back Moderate difficulty   Use a knife to cut food No difficulty   Recreational activities in which you take some force or impact through your arm, shoulder, or hand (golf, hammering, tennis) No difficulty   During the past week, to what extent has your arm, shoulder or hand problem interfered with your normal social activities with family, friends, neighbors, or groups? Not at all   During the past week, to what extent has your arm, shoulder or hand problem limited your work or other regular daily activities Quite a bit   Arm, shoulder, or hand pain. Moderate   Tingling (pins and needles)  in your arm, shoulder, or hand Moderate   Difficulty Sleeping Mild difficulty   DASH Score 38.64 %              OT Treatments/Exercises (OP) - 08/12/16 1121      Exercises   Exercises Shoulder;Elbow;Wrist     Shoulder Exercises: Supine   Protraction Strengthening;12 reps   Protraction Weight (lbs) 1   Horizontal ABduction Strengthening;12 reps   Horizontal ABduction Weight (lbs) 1   External Rotation Strengthening;12 reps   External Rotation Weight (lbs) 1   Internal Rotation Strengthening;12 reps   Internal Rotation Weight (lbs) 1   Flexion Strengthening;12 reps   Shoulder Flexion Weight (lbs) 1   ABduction Strengthening;12 reps   Shoulder ABduction Weight (lbs) 1     Shoulder Exercises: Standing   Protraction Theraband;10 reps   Theraband Level (Shoulder Protraction) Level 2 (Red)   Horizontal ABduction Theraband;10 reps   Theraband Level (Shoulder Horizontal ABduction) Level 2 (Red)   External Rotation Theraband;10 reps   Theraband Level (Shoulder  External Rotation) Level 2 (Red)   Internal Rotation Theraband;10 reps   Theraband Level (Shoulder Internal Rotation) Level 2 (Red)   Flexion Theraband;10 reps   Theraband Level (Shoulder Flexion) Level 2 (Red)   ABduction Theraband;10 reps   Theraband Level (Shoulder ABduction) Level 2 (Red)   Extension Theraband;12 reps   Theraband Level (Shoulder Extension) Level 2 (Red)   Row Theraband;12 reps   Theraband Level (Shoulder Row) Level 2 (Red)   Retraction Theraband;12 reps   Theraband Level (Shoulder Retraction) Level 2 (Red)     Shoulder Exercises: ROM/Strengthening   UBE (Upper Arm Bike) 3' forward; 3' backwards, level 1   Ball on Wall 1' flexion     Manual Therapy   Manual Therapy Myofascial release   Manual therapy comments manual therapy compelted seperately from all other interventions this date   Myofascial Release Myofascial release and manual stretching to left upper arm, scapular region, and associated areas to decrease fascial restrictions and muscle knots that are causing decreased functional use of her left arm.                   OT Short Term Goals - 08/07/16 1212      OT SHORT TERM GOAL #1   Title Patient will be educated on HEP for improved left shoulder anad arm strength.   Time 4   Period Weeks   Status On-going     OT SHORT TERM GOAL #2   Title Patient will improve left arm strength to 5/5 for improved ability to lift clothes into overhead cabinets.   Time 4   Period Weeks   Status On-going     OT SHORT TERM GOAL #3   Title Patient will improve left grip stength 15 pounds and pinch strength by 2 pounds or more for greater ability to maintain grasp on items.     Time 4   Period Weeks   Status On-going     OT SHORT TERM GOAL #4   Title Patient will improve left arm sensation to 2.83 normal sensation for greater safety when completing ADLs.   Baseline 4   Period Weeks   Status On-going     OT SHORT TERM GOAL #5   Title Patient will  decrease pain to 2/10 or better in left arm for graeater ability to  complete BADLs.    Time 4   Period Weeks   Status On-going  Plan - 2016/09/10 1212    Clinical Impression Statement A: Dash completed this session, g-codes updated. Added theraband strengthening exercises this session, pt required intermittent verbal cuing for form and technique, difficulty with horizontal abduction and abduction. Pt reports MD would like her to continue therapy for 3 additional weeks, will see what insurance will approve and cover.    Plan P: Make additional appts. Continue with shoulder strengthening and stability exercises.    OT Home Exercise Plan 07/24/16: removed tendon glides, continue with other exercises; 08/06/16: added strengthening exercises with 2# for elbow and forearm; 2022/09/11: red theraband strengthening   Consulted and Agree with Plan of Care Patient      Patient will benefit from skilled therapeutic intervention in order to improve the following deficits and impairments:  Decreased strength, Increased fascial restricitons, Increased muscle spasms, Impaired UE functional use, Pain  Visit Diagnosis: Acute pain of left shoulder  Other symptoms and signs involving the musculoskeletal system      G-Codes - 09-10-16 1213    Functional Assessment Tool Used (Outpatient only) DASH 38/100, 38% impaired   Functional Limitation Carrying, moving and handling objects   Carrying, Moving and Handling Objects Current Status (H1505) At least 20 percent but less than 40 percent impaired, limited or restricted   Carrying, Moving and Handling Objects Goal Status (W9794) At least 20 percent but less than 40 percent impaired, limited or restricted      Problem List Patient Active Problem List   Diagnosis Date Noted  . Osteoporosis 05/12/2013  . UTI (urinary tract infection) 03/28/2013  . Hyperlipidemia 01/19/2013  . Colon cancer screening 01/19/2013  . 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 ALLERGIC RHINITIS 09/10/2007  . GERD 09/10/2007  . Fibromyalgia 09/10/2007   Guadelupe Sabin, OTR/L  [phone] 09-10-2016, 12:14 PM  [facility] [address] [place], [place], [address] Phone: [phone]   Fax:  [phone]  Name: REBECKAH MASIH MRN: [id number] Date of Birth: 10/09/1945

## 2016-08-12 NOTE — Patient Instructions (Signed)
Theraband strengthening: Complete 10-15X, 1-2X/day  1) Shoulder protraction  Anchor band in doorway, stand with back to door. Push your hand forward as much as you can to bringing your shoulder blades forward on your rib cage.     2) Shoulder flexion  While standing with back to the door, holding Theraband at hand level, raise arm in front of you.  Keep elbow straight through entire movement.      3) Shoulder horizontal abduction  Standing with a theraband anchored at chest height, begin with arm straight and some tension in the band. Move your arm out to your side (keeping straight the whole time). Bring the affected arm back to midline.     4) Shoulder Internal Rotation  While holding an elastic band at your side with your elbow bent, start with your hand away from your stomach, then pull the band towards your stomach. Keep your elbow near your side the entire time.     5) Shoulder External Rotation  While holding an elastic band at your side with your elbow bent, start with your hand near your stomach and then pull the band away. Keep your elbow at your side the entire time.     6) Shoulder abduction  While holding an elastic band at your side, draw up your arm to the side keeping your elbow straight.      (Home) Extension: Isometric / Bilateral Arm Retraction - Sitting   Facing anchor, hold hands and elbow at shoulder height, with elbow bent.  Pull arms back to squeeze shoulder blades together. Repeat 10-15 times.  Copyright  VHI. All rights reserved.   (Home) Retraction: Row - Bilateral (Anchor)   Facing anchor, arms reaching forward, pull hands toward stomach, keeping elbows bent and at your sides and pinching shoulder blades together. Repeat 10-15 times.  Copyright  VHI. All rights reserved.   (Clinic) Extension / Flexion (Assist)   Face anchor, pull arms back, keeping elbow straight, and squeze shoulder blades together. Repeat 10-15  times.   Copyright  VHI. All rights reserved.

## 2016-08-14 ENCOUNTER — Ambulatory Visit (HOSPITAL_COMMUNITY): Payer: BC Managed Care – PPO | Admitting: Occupational Therapy

## 2016-08-14 ENCOUNTER — Encounter (HOSPITAL_COMMUNITY): Payer: Self-pay | Admitting: Occupational Therapy

## 2016-08-14 DIAGNOSIS — M25512 Pain in left shoulder: Secondary | ICD-10-CM

## 2016-08-14 DIAGNOSIS — R29898 Other symptoms and signs involving the musculoskeletal system: Secondary | ICD-10-CM

## 2016-08-14 NOTE — Therapy (Signed)
Jenn[place] H[place], [place], [address] Phone: [phone]   Fax:  [phone]  Occupational Therapy Treatment  Patient Details  Name: Jennifer Hays MRN: [id number] Date of Birth: 1945/11/01 Referring Provider: Dr. Sanjuana Kava  Encounter Date: 08/14/2016      OT End of Session - 08/14/16 1345    Visit Number 10   Number of Visits 12   Date for OT Re-Evaluation 08/23/16   Authorization Type Medicare   Authorization Time Period before 19th visit   Authorization - Visit Number 10   Authorization - Number of Visits 19   OT Start Time 1302   OT Stop Time 1348   OT Time Calculation (min) 46 min   Activity Tolerance Patient tolerated treatment well   Behavior During Therapy [facility] for tasks assessed/performed      Past Medical History:  Diagnosis Date  . Allergy   . Asthma   . Atrophic vaginitis   . Osteopenia     Past Surgical History:  Procedure Laterality Date  . ABDOMINAL HYSTERECTOMY    . KNEE SURGERY     Right  . SPINE SURGERY      There were no vitals filed for this visit.      Subjective Assessment - 08/14/16 1302    Subjective  S: Last night it started hurting after doing exercises and lifting some things.   Currently in Pain? Yes   Pain Score 5    Pain Location Arm   Pain Orientation Left   Pain Descriptors / Indicators Aching;Constant   Pain Type Acute pain   Pain Radiating Towards shoulder   Pain Onset More than a month ago   Pain Frequency Constant   Aggravating Factors  movement, too much use   Pain Relieving Factors heat   Effect of Pain on Daily Activities minimal   Multiple Pain Sites No            [facility] OT Assessment - 08/14/16 1311      Assessment   Diagnosis Left Arm Pain S/P Fall     Precautions   Precautions None     AROM   Overall AROM Comments LUE A/ROM is WNL     PROM   Overall PROM Comments LUE P/ROM is WNL     Strength   Strength Assessment Site Shoulder   Left Shoulder Flexion 4-/5  same as previous   Left Shoulder ABduction 4-/5  4/5 previous   Left Shoulder Internal Rotation 5/5  4/5 previous   Left Shoulder External Rotation 4+/5  4-/5 previous                  OT Treatments/Exercises (OP) - 08/14/16 1312      Exercises   Exercises Shoulder;Elbow;Wrist     Shoulder Exercises: Supine   Protraction PROM;5 reps;Strengthening;12 reps   Protraction Weight (lbs) 1   Horizontal ABduction PROM;5 reps;Strengthening;12 reps   Horizontal ABduction Weight (lbs) 1   External Rotation PROM;5 reps;Strengthening;12 reps   External Rotation Weight (lbs) 1   Internal Rotation PROM;5 reps;Strengthening;12 reps   Internal Rotation Weight (lbs) 1   Flexion PROM;5 reps;Strengthening;12 reps   Shoulder Flexion Weight (lbs) 1   ABduction PROM;5 reps;Strengthening;12 reps   Shoulder ABduction Weight (lbs) 1     Shoulder Exercises: Standing   Protraction Theraband;10 reps   Theraband Level (Shoulder Protraction) Level 2 (Red)   Horizontal ABduction Theraband;10 reps   Theraband Level (Shoulder Horizontal ABduction) Level  2 (Red)   External Rotation Theraband;10 reps   Theraband Level (Shoulder External Rotation) Level 2 (Red)   Internal Rotation Theraband;10 reps   Theraband Level (Shoulder Internal Rotation) Level 2 (Red)   Flexion Theraband;10 reps   Theraband Level (Shoulder Flexion) Level 2 (Red)   ABduction Theraband;10 reps   Theraband Level (Shoulder ABduction) Level 2 (Red)   Extension Theraband;12 reps   Theraband Level (Shoulder Extension) Level 2 (Red)   Row Theraband;12 reps   Theraband Level (Shoulder Row) Level 2 (Red)   Retraction Theraband;12 reps   Theraband Level (Shoulder Retraction) Level 2 (Red)     Shoulder Exercises: ROM/Strengthening   UBE (Upper Arm Bike) 3' forward; 3' backwards, level 1   Proximal Shoulder Strengthening, Seated  10 times each, no rest breaks, 1#   Ball on Wall 1' flexion, 1' abduction      Manual Therapy   Manual Therapy Myofascial release   Manual therapy comments manual therapy compelted seperately from all other interventions this date   Myofascial Release Myofascial release and manual stretching to left upper arm, scapular region, and associated areas to decrease fascial restrictions and muscle knots that are causing decreased functional use of her left arm.                 OT Education - 08/14/16 1343    Education provided Yes   Education Details Reviewed HEP and encouraged pt to continue doing this at home   Person(s) Educated Patient   Methods Explanation;Demonstration;Verbal cues   Comprehension Verbalized understanding;Returned demonstration          OT Short Term Goals - 08/14/16 1537      OT SHORT TERM GOAL #1   Title Patient will be educated on HEP for improved left shoulder anad arm strength.   Time 4   Period Weeks   Status On-going     OT SHORT TERM GOAL #2   Title Patient will improve left arm strength to 5/5 for improved ability to lift clothes into overhead cabinets.   Time 4   Period Weeks   Status On-going     OT SHORT TERM GOAL #3   Title Patient will improve left grip stength 15 pounds and pinch strength by 2 pounds or more for greater ability to maintain grasp on items.     Time 4   Period Weeks   Status Achieved     OT SHORT TERM GOAL #4   Title Patient will improve left arm sensation to 2.83 normal sensation for greater safety when completing ADLs.   Baseline 4   Period Weeks   Status Achieved     OT SHORT TERM GOAL #5   Title Patient will decrease pain to 2/10 or better in left arm for graeater ability to  complete BADLs.    Time 4   Period Weeks   Status On-going                  Plan - 08/14/16 1345    Clinical Impression Statement A: Reassessment completed this session as pt is leaving for vacation for 2-3 weeks and will not be able to attend therapy sessions. HEP including theraband strengthening  and strengthening with weights reviewed this session. Pt has met 2/5 STGs and has made progress towards strength goal. Pt reports tingling sensation comes and goes. Pt would like to hold therapy until after her vacation and next MD appt and determine if additional visits are needed.  Plan P: Hold therapy until after MD appt. Determine if additional therapy is needed.    OT Home Exercise Plan 07/24/16: removed tendon glides, continue with other exercises; 08/06/16: added strengthening exercises with 2# for elbow and forearm; 6/11: red theraband strengthening   Consulted and Agree with Plan of Care Patient      Patient will benefit from skilled therapeutic intervention in order to improve the following deficits and impairments:  Decreased strength, Increased fascial restricitons, Increased muscle spasms, Impaired UE functional use, Pain  Visit Diagnosis: Acute pain of left shoulder  Other symptoms and signs involving the musculoskeletal system    Problem List Patient Active Problem List   Diagnosis Date Noted  . Osteoporosis 05/12/2013  . UTI (urinary tract infection) 03/28/2013  . Hyperlipidemia 01/19/2013  . Colon cancer screening 01/19/2013  . 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 ALLERGIC RHINITIS 09/10/2007  . GERD 09/10/2007  . Fibromyalgia 09/10/2007   Guadelupe Sabin, OTR/L  [phone] 08/14/2016, 3:43 PM  [facility] [address] [place], [place], [address] Phone: [phone]   Fax:  [phone]  Name: ASHLY YEPEZ MRN: [id number] Date of Birth: 1945-10-14

## 2016-09-03 ENCOUNTER — Ambulatory Visit: Payer: BC Managed Care – PPO | Admitting: Orthopaedic Surgery

## 2016-09-05 DIAGNOSIS — T161XXA Foreign body in right ear, initial encounter: Secondary | ICD-10-CM | POA: Diagnosis not present

## 2016-09-11 ENCOUNTER — Encounter: Payer: Self-pay | Admitting: Orthopaedic Surgery

## 2016-09-11 ENCOUNTER — Ambulatory Visit (INDEPENDENT_AMBULATORY_CARE_PROVIDER_SITE_OTHER): Payer: BC Managed Care – PPO | Admitting: Orthopaedic Surgery

## 2016-09-11 VITALS — BP 127/73 | HR 64 | Temp 97.3°F | Ht 68.0 in | Wt 164.0 lb

## 2016-09-11 DIAGNOSIS — M25512 Pain in left shoulder: Secondary | ICD-10-CM | POA: Diagnosis not present

## 2016-09-11 NOTE — Progress Notes (Signed)
Patient Jennifer Hays, female DOB:1945-11-10, 71 y.o. URK:[id number]  Chief Complaint  Patient presents with  . Follow-up    left shoulder pain    HPI  Jennifer Hays is a 71 y.o. female who has had pain in the left shoulder.  She went to OT and is significantly improved.  She has little pain now if any.  She is very pleased. HPI  Body mass index is 24.94 kg/m.  ROS  Review of Systems  HENT: Negative for congestion.   Respiratory: Negative for cough and shortness of breath.   Cardiovascular: Negative for chest pain and leg swelling.  Endocrine: Positive for cold intolerance.  Musculoskeletal: Positive for arthralgias.  Allergic/Immunologic: Positive for environmental allergies.    Past Medical History:  Diagnosis Date  . Allergy   . Asthma   . Atrophic vaginitis   . Osteopenia     Past Surgical History:  Procedure Laterality Date  . ABDOMINAL HYSTERECTOMY    . KNEE SURGERY     Right  . SPINE SURGERY      No family history on file.  Social History Social History  Substance Use Topics  . Smoking status: Former Research scientist (life sciences)  . Smokeless tobacco: Never Used     Comment: quit 1970's  . Alcohol use No    Allergies  Allergen Reactions  . Clindamycin/Lincomycin     Itching   . Penicillins     REACTION: rash, itching, difficulty breathing    Current Outpatient Prescriptions  Medication Sig Dispense Refill  . ADVAIR DISKUS 250-50 MCG/DOSE AEPB INHALE 1 PUFF INTO THE LUNGS 2 TIMES DAILY. 60 each 0  . albuterol (PROVENTIL HFA;VENTOLIN HFA) 108 (90 BASE) MCG/ACT inhaler Inhale 2 puffs into the lungs every 4 (four) hours as needed for wheezing or shortness of breath. 1 Inhaler 3  . albuterol (PROVENTIL) (2.5 MG/3ML) 0.083% nebulizer solution Take 3 mLs (2.5 mg total) by nebulization every 6 (six) hours as needed for wheezing. 150 mL 6  . alendronate (FOSAMAX) 70 MG tablet TAKE 1 TABLET BY MOUTH ONCE A WEEK. TAKE WITH A FULL GLASS OF WATER ON AN EMPTY STOMACH. 4 tablet  0  . Ascorbic Acid (VITAMIN C) 1000 MG tablet Take 500 mg by mouth daily.     . budesonide (PULMICORT) 1 MG/2ML nebulizer solution Take 2 mLs (1 mg total) by nebulization daily. 120 mL 3  . Calcium Carbonate-Vitamin D (CALCIUM 600/VITAMIN D) 600-400 MG-UNIT per tablet Take 1 tablet by mouth 2 (two) times daily.    . cetirizine (ZYRTEC) 10 MG tablet Take 10 mg by mouth daily.    . clobetasol cream (TEMOVATE) 8.31 % Apply 1 application topically 2 (two) times daily. (Patient not taking: Reported on 07/17/2016) 45 g 1  . fenofibrate 54 MG tablet TAKE 1 TABLET (54 MG TOTAL) BY MOUTH DAILY. (Patient not taking: Reported on 07/17/2016) 90 tablet 1  . HYDROcodone-acetaminophen (NORCO/VICODIN) 5-325 MG per tablet Take 1 tablet by mouth every 6 (six) hours as needed for moderate pain.    Marland Kitchen lidocaine (LIDODERM) 5 % Place 1 patch onto the skin daily. Remove & Discard patch within 12 hours or as directed by MD    . Meth-Hyo-M Bl-Na Phos-Ph Sal (URIBEL) 118 MG CAPS Take as needed 120 capsule   . Multiple Vitamin (MULTIVITAMIN) tablet Take 1 tablet by mouth daily.    . sodium chloride (OCEAN) 0.65 % SOLN nasal spray Place 1 spray into both nostrils as needed for congestion.    . tizanidine (  ZANAFLEX) 2 MG capsule   3   No current facility-administered medications for this visit.      Physical Exam  Blood pressure 127/73, pulse 64, temperature (!) 97.3 F (36.3 C), height 5\' 8"  (1.727 m), weight 164 lb (74.4 kg).  Constitutional: overall normal hygiene, normal nutrition, well developed, normal grooming, normal body habitus. Assistive device:cane  Musculoskeletal: gait and station Limp left, muscle tone and strength are normal, no tremors or atrophy is present.  .  Neurological: coordination overall normal.  Deep tendon reflex/nerve stretch intact.  Sensation normal.  Cranial nerves II-XII intact.   Skin:   Normal overall no scars, lesions, ulcers or rashes. No psoriasis.  Psychiatric: Alert and  oriented x 3.  Recent memory intact, remote memory unclear.  Normal mood and affect. Well groomed.  Good eye contact.  Cardiovascular: overall no swelling, no varicosities, no edema bilaterally, normal temperatures of the legs and arms, no clubbing, cyanosis and good capillary refill.  Lymphatic: palpation is normal.  Left shoulder has full painless ROM.  NV intact.  Muscle tone and strength normal.  The patient has been educated about the nature of the problem(s) and counseled on treatment options.  The patient appeared to understand what I have discussed and is in agreement with it.  Encounter Diagnosis  Name Primary?  . Pain in joint of left shoulder Yes    PLAN Call if any problems.  Precautions discussed.  Continue current medications.   Return to clinic prn   Electronically Signed Sanjuana Kava, MD 7/11/20181:41 PM

## 2016-09-16 DIAGNOSIS — N3020 Other chronic cystitis without hematuria: Secondary | ICD-10-CM | POA: Diagnosis not present

## 2016-09-16 DIAGNOSIS — R350 Frequency of micturition: Secondary | ICD-10-CM | POA: Diagnosis not present

## 2016-09-16 DIAGNOSIS — R3912 Poor urinary stream: Secondary | ICD-10-CM | POA: Diagnosis not present

## 2016-09-24 ENCOUNTER — Other Ambulatory Visit: Payer: Self-pay | Admitting: Family Medicine

## 2016-10-24 ENCOUNTER — Other Ambulatory Visit: Payer: Self-pay | Admitting: Family Medicine

## 2016-12-05 ENCOUNTER — Other Ambulatory Visit: Payer: Self-pay | Admitting: Family Medicine

## 2016-12-26 DIAGNOSIS — Z23 Encounter for immunization: Secondary | ICD-10-CM | POA: Diagnosis not present

## 2017-01-09 ENCOUNTER — Other Ambulatory Visit: Payer: Self-pay | Admitting: *Deleted

## 2017-01-09 MED ORDER — ALENDRONATE SODIUM 70 MG PO TABS: ORAL_TABLET | ORAL | 1 refills | Status: DC

## 2017-01-22 ENCOUNTER — Other Ambulatory Visit: Payer: Self-pay

## 2017-01-22 ENCOUNTER — Encounter: Payer: Self-pay | Admitting: Family Medicine

## 2017-01-22 ENCOUNTER — Ambulatory Visit (INDEPENDENT_AMBULATORY_CARE_PROVIDER_SITE_OTHER): Payer: BC Managed Care – PPO | Admitting: Family Medicine

## 2017-01-22 VITALS — BP 122/62 | HR 74 | Temp 97.9°F | Resp 14 | Ht 68.0 in | Wt 168.0 lb

## 2017-01-22 DIAGNOSIS — N644 Mastodynia: Secondary | ICD-10-CM

## 2017-01-22 DIAGNOSIS — G8929 Other chronic pain: Secondary | ICD-10-CM | POA: Diagnosis not present

## 2017-01-22 DIAGNOSIS — R0789 Other chest pain: Secondary | ICD-10-CM | POA: Diagnosis not present

## 2017-01-22 DIAGNOSIS — M25512 Pain in left shoulder: Secondary | ICD-10-CM | POA: Diagnosis not present

## 2017-01-22 DIAGNOSIS — L821 Other seborrheic keratosis: Secondary | ICD-10-CM

## 2017-01-22 MED ORDER — MELOXICAM 7.5 MG PO TABS: 7.5000 mg | ORAL_TABLET | Freq: Every day | ORAL | 0 refills | Status: DC

## 2017-01-22 NOTE — Progress Notes (Signed)
   Subjective:    Patient ID: Jennifer Hays, female    DOB: [birth year], 71 y.o.   MRN: [id number]  Patient presents for L Breast Tenderness (underside of breast has tender area) and Skin Irritation (hard skin like area to L lower knee that just fell off- would like to be assessed)  Patient here with left breast tenderness.  She is pointing to more where the breasts is near the mid sternum.  States that she was doing exercises for her chronic shoulder pain which continues to give her problems.  She is not sure if she just pulled something.  She has not felt any mass or nodule.  She had a normal mammogram in the spring 2018.  No discharge from the nipple.  No rash.  She also has a hard area of That look like a sore that came off of her knee   Review Of Systems:  GEN- denies fatigue, fever, weight loss,weakness, recent illness HEENT- denies eye drainage, change in vision, nasal discharge, CVS- denies chest pain, palpitations RESP- denies SOB, cough, wheeze ABD- denies N/V, change in stools, abd pain GU- denies dysuria, hematuria, dribbling, incontinence MSK- + joint pain, muscle aches, injury Neuro- denies headache, dizziness, syncope, seizure activity       Objective:    BP 122/62   Pulse 74   Temp 97.9 F (36.6 C) (Oral)   Resp 14   Ht 5\' 8"  (1.727 m)   Wt 168 lb (76.2 kg)   SpO2 96%   BMI 25.54 kg/m  GEN- NAD, alert and oriented x3 HEENT- PERRL, EOMI, non injected sclera, pink conjunctiva, MMM, oropharynx clear Chest wall- TTP across upper chest and near sternum, left ant shoulder Breast- normal symmetry, no nipple inversion,no nipple drainage, no nodules or lumps felt, TTP around left 9 o clock positon Nodes- no axillary nodes CVS- RRR, no murmur RESP-CTAB Skin- mutliple seb keratotis on back, leg, cherry angiomos on back, chest  MSK fair ROM left shoulder, boney tenderness , crepitus with ROM        Assessment & Plan:      Problem List Items Addressed This Visit     None    Visit Diagnoses    Chest wall pain    -  Primary   Based on exam, more chest wall, MSK pain, no mass palpated, will treat with NSAIDS, Rest, if breast pain does not resolve diagnostic mammogram after 1 week   Breast pain       Seborrheic keratoses       Pt scraped off in office, has other on legs, back, benign lesions   Chronic left shoulder pain       Recommend she f/u with orthopedics    Relevant Medications   meloxicam (MOBIC) 7.5 MG tablet      Note: This dictation was prepared with Dragon dictation along with smaller phrase technology. Any transcriptional errors that result from this process are unintentional.

## 2017-01-22 NOTE — Patient Instructions (Signed)
Take the meloxicam once a day  Call Dr. Luna Glasgow for your shoulder Call me next wed if not improved F/U as needed

## 2017-02-18 ENCOUNTER — Other Ambulatory Visit: Payer: Self-pay | Admitting: Family Medicine

## 2017-03-11 ENCOUNTER — Other Ambulatory Visit: Payer: Self-pay | Admitting: *Deleted

## 2017-03-11 ENCOUNTER — Encounter: Payer: Self-pay | Admitting: Orthopaedic Surgery

## 2017-03-11 ENCOUNTER — Ambulatory Visit (INDEPENDENT_AMBULATORY_CARE_PROVIDER_SITE_OTHER): Payer: BC Managed Care – PPO | Admitting: Orthopaedic Surgery

## 2017-03-11 VITALS — BP 131/74 | HR 65 | Temp 97.2°F | Ht 68.0 in | Wt 167.0 lb

## 2017-03-11 DIAGNOSIS — M25512 Pain in left shoulder: Secondary | ICD-10-CM | POA: Diagnosis not present

## 2017-03-11 DIAGNOSIS — M542 Cervicalgia: Secondary | ICD-10-CM

## 2017-03-11 MED ORDER — PREDNISONE 5 MG (21) PO TBPK: ORAL_TABLET | ORAL | 0 refills | Status: DC

## 2017-03-11 MED ORDER — FLUTICASONE-SALMETEROL 250-50 MCG/DOSE IN AEPB: INHALATION_SPRAY | RESPIRATORY_TRACT | 3 refills | Status: DC

## 2017-03-11 NOTE — Progress Notes (Signed)
Patient [name], female DOB:[birth year], 72 y.o. TLX:[id number]  Chief Complaint  Patient presents with  . Follow-up    Left shoulder and neck pain    HPI  Jennifer Hays is a 72 y.o. female who has recurrence of neck and left shoulder pain.  I last saw her in July.  She had been to OT and was having no pain then.  The pain has started up again and is bothering her a lot now.  She is getting no relief.  She has no trauma, no paresthesias. HPI  Body mass index is 25.39 kg/m.  ROS  Review of Systems  HENT: Negative for congestion.   Respiratory: Negative for cough and shortness of breath.   Cardiovascular: Negative for chest pain and leg swelling.  Endocrine: Positive for cold intolerance.  Musculoskeletal: Positive for arthralgias.  Allergic/Immunologic: Positive for environmental allergies.  All other systems reviewed and are negative.   Past Medical History:  Diagnosis Date  . Allergy   . Asthma   . Atrophic vaginitis   . Osteopenia     Past Surgical History:  Procedure Laterality Date  . ABDOMINAL HYSTERECTOMY    . KNEE SURGERY     Right  . SPINE SURGERY      History reviewed. No pertinent family history.  Social History Social History   Tobacco Use  . Smoking status: Former Research scientist (life sciences)  . Smokeless tobacco: Never Used  . Tobacco comment: quit 1970's  Substance Use Topics  . Alcohol use: No    Alcohol/week: 0.0 oz  . Drug use: No    Allergies  Allergen Reactions  . Clindamycin/Lincomycin     Itching   . Penicillins     REACTION: rash, itching, difficulty breathing    Current Outpatient Medications  Medication Sig Dispense Refill  . albuterol (PROVENTIL HFA;VENTOLIN HFA) 108 (90 BASE) MCG/ACT inhaler Inhale 2 puffs into the lungs every 4 (four) hours as needed for wheezing or shortness of breath. 1 Inhaler 3  . albuterol (PROVENTIL) (2.5 MG/3ML) 0.083% nebulizer solution Take 3 mLs (2.5 mg total) by nebulization every 6 (six) hours as needed for  wheezing. 150 mL 6  . alendronate (FOSAMAX) 70 MG tablet TAKE 1 TABLET BY MOUTH ONCE A WEEK. TAKE WITH A FULL GLASS OF WATER ON AN EMPTY STOMACH. 12 tablet 1  . Ascorbic Acid (VITAMIN C) 1000 MG tablet Take 500 mg by mouth daily.     . budesonide (PULMICORT) 1 MG/2ML nebulizer solution Take 2 mLs (1 mg total) by nebulization daily. 120 mL 3  . Calcium Carbonate-Vitamin D (CALCIUM 600/VITAMIN D) 600-400 MG-UNIT per tablet Take 1 tablet by mouth 2 (two) times daily.    . cetirizine (ZYRTEC) 10 MG tablet Take 10 mg by mouth daily.    . clobetasol cream (TEMOVATE) 7.41 % Apply 1 application topically 2 (two) times daily. 45 g 1  . Fluticasone-Salmeterol (ADVAIR DISKUS) 250-50 MCG/DOSE AEPB INHALE 1 PUFF INTO THE LUNGS 2 TIMES DAILY. 180 each 3  . HYDROcodone-acetaminophen (NORCO/VICODIN) 5-325 MG per tablet Take 1 tablet by mouth every 6 (six) hours as needed for moderate pain.    Marland Kitchen lidocaine (LIDODERM) 5 % Place 1 patch onto the skin daily. Remove & Discard patch within 12 hours or as directed by MD    . meloxicam (MOBIC) 7.5 MG tablet TAKE 1 TABLET BY MOUTH EVERY DAY 30 tablet 0  . Meth-Hyo-M Bl-Na Phos-Ph Sal (URIBEL) 118 MG CAPS Take as needed 120 capsule   .  Multiple Vitamin (MULTIVITAMIN) tablet Take 1 tablet by mouth daily.    . sodium chloride (OCEAN) 0.65 % SOLN nasal spray Place 1 spray into both nostrils as needed for congestion.    . tizanidine (ZANAFLEX) 2 MG capsule   3   No current facility-administered medications for this visit.      Physical Exam  Blood pressure 131/74, pulse 65, temperature (!) 97.2 F (36.2 C), height 5\' 8"  (1.727 m), weight 167 lb (75.8 kg).  Constitutional: overall normal hygiene, normal nutrition, well developed, normal grooming, normal body habitus. Assistive device: cane right  Musculoskeletal: gait and station Limp to right, muscle tone and strength are normal, no tremors or atrophy is present.  .  Neurological: coordination overall normal.  Deep  tendon reflex/nerve stretch intact.  Sensation normal.  Cranial nerves II-XII intact.   Skin:   Normal overall no scars, lesions, ulcers or rashes. No psoriasis.  Psychiatric: Alert and oriented x 3.  Recent memory intact, remote memory unclear.  Normal mood and affect. Well groomed.  Good eye contact.  Cardiovascular: overall no swelling, no varicosities, no edema bilaterally, normal temperatures of the legs and arms, no clubbing, cyanosis and good capillary refill.  Lymphatic: palpation is normal.  All other systems reviewed and are negative   Her neck is tender on the left upper trapezius area. ROM of the neck and left shoulder is full but tender in the extremes.  NV intact.  The patient has been educated about the nature of the problem(s) and counseled on treatment options.  The patient appeared to understand what I have discussed and is in agreement with it.  Encounter Diagnoses  Name Primary?  . Pain in joint of left shoulder Yes  . Cervicalgia     PLAN Call if any problems.  Precautions discussed.  Continue current medications.   Return to clinic 2 weeks   Begin OT.  Electronically Signed Sanjuana Kava, MD 1/8/201910:29 AM

## 2017-03-14 ENCOUNTER — Ambulatory Visit: Payer: Medicare Other | Admitting: Family Medicine

## 2017-04-01 ENCOUNTER — Ambulatory Visit: Payer: BC Managed Care – PPO | Admitting: Orthopaedic Surgery

## 2017-05-05 ENCOUNTER — Encounter: Payer: Self-pay | Admitting: Family Medicine

## 2017-05-05 ENCOUNTER — Other Ambulatory Visit: Payer: Self-pay

## 2017-05-05 ENCOUNTER — Ambulatory Visit (INDEPENDENT_AMBULATORY_CARE_PROVIDER_SITE_OTHER): Payer: BC Managed Care – PPO | Admitting: Family Medicine

## 2017-05-05 ENCOUNTER — Encounter: Payer: Self-pay | Admitting: *Deleted

## 2017-05-05 VITALS — BP 126/72 | HR 67 | Temp 98.1°F | Resp 14 | Ht 68.0 in | Wt 165.0 lb

## 2017-05-05 DIAGNOSIS — Z0000 Encounter for general adult medical examination without abnormal findings: Secondary | ICD-10-CM

## 2017-05-05 DIAGNOSIS — J45909 Unspecified asthma, uncomplicated: Secondary | ICD-10-CM

## 2017-05-05 DIAGNOSIS — M25512 Pain in left shoulder: Secondary | ICD-10-CM | POA: Diagnosis not present

## 2017-05-05 DIAGNOSIS — Z1239 Encounter for other screening for malignant neoplasm of breast: Secondary | ICD-10-CM

## 2017-05-05 DIAGNOSIS — Z23 Encounter for immunization: Secondary | ICD-10-CM | POA: Diagnosis not present

## 2017-05-05 DIAGNOSIS — M816 Localized osteoporosis [Lequesne]: Secondary | ICD-10-CM | POA: Diagnosis not present

## 2017-05-05 DIAGNOSIS — G8929 Other chronic pain: Secondary | ICD-10-CM

## 2017-05-05 DIAGNOSIS — M797 Fibromyalgia: Secondary | ICD-10-CM | POA: Diagnosis not present

## 2017-05-05 DIAGNOSIS — E782 Mixed hyperlipidemia: Secondary | ICD-10-CM | POA: Diagnosis not present

## 2017-05-05 DIAGNOSIS — Z1231 Encounter for screening mammogram for malignant neoplasm of breast: Secondary | ICD-10-CM | POA: Diagnosis not present

## 2017-05-05 NOTE — Patient Instructions (Addendum)
Referral to orthopedics for your shoulder  Schedule your mammogram  F/U 6 months

## 2017-05-05 NOTE — Progress Notes (Signed)
Subjective:   Patient presents for Medicare Annual/Subsequent preventive examination.      Followed by pain clinic Dr. Brien Few, had shots for bursitis in hip    Chronic Left shoulder pain- has done physical therapy in June of 2018 after seeing Dr. Luna Glasgow, she returned last in January and no improvement in pain, especially when she puts weight onto her left hand/arm. She feels it turns colors as well   Xra showed degeneration at the [place] haing pain in both  breast, was wearing an underwire bra and that helped , pain started after a fall, I checked back in November more MSK chest wall pain  Due for screening mammogram 3/14    Asthma- has pulmicort neb, also using advair , has zyrtec    Hyperlipidemia does not tolerate cholesterol medications    Osteoporosis- on fosamax and vitamin D    Review Past Medical/Family/Social: Per EMR    Risk Factors  Current exercise habits: Little due to chronic pain  Dietary issues discussed: Yes  Cardiac risk factors:Mild hyperlipidemia  Depression Screen  (Note: if answer to either of the following is "Yes", a more complete depression screening is indicated)  Over the past two weeks, have you felt down, depressed or hopeless? No Over the past two weeks, have you felt little interest or pleasure in doing things? No Have you lost interest or pleasure in daily life? No Do you often feel hopeless? No Do you cry easily over simple problems? No   Activities of Daily Living  In your present state of health, do you have any difficulty performing the following activities?:  Driving? Yes Managing money? No  Feeding yourself? No  Getting from bed to chair? No  Climbing a flight of stairs? Yes Preparing food and eating?: No  Bathing or showering? No  Getting dressed: No  Getting to the toilet? No  Using the toilet:No  Moving around from place to place: No  In the past year have you fallen or had a near fall?:Yes  Are you sexually  active? Yes  Do you have more than one partner? No   Hearing Difficulties: No  Do you often ask people to speak up or repeat themselves? No  Do you experience ringing or noises in your ears? No Do you have difficulty understanding soft or whispered voices? No  Do you feel that you have a problem with memory? No Do you often misplace items? No  Do you feel safe at home? Yes  Cognitive Testing  Alert? Yes Normal Appearance?Yes  Oriented to person? Yes Place? Yes  Time? Yes  Recall of three objects? Yes  Can perform simple calculations? Yes  Displays appropriate judgment?Yes  Can read the correct time from a watch face?Yes   List the Names of Other Physician/Practitioners you currently use: Dr. Brien Few  Dr. Luna Glasgow   Screening Tests / Date Cologuard- Negative Feb 2018 Zostavax  Declines  Mammogram  UTD  Influenza Vaccine - UTD Pneumonia vaccine- Due for booster of PNA23  Tetanus/tdap UTD  Bone Density  UTD 2018  ROS: GEN- denies fatigue, fever, weight loss,weakness, recent illness HEENT- denies eye drainage, change in vision, nasal discharge, CVS- denies chest pain, palpitations RESP- denies SOB, cough, wheeze ABD- denies N/V, change in stools, abd pain GU- denies dysuria, hematuria, dribbling, incontinence MSK- + joint pain, muscle aches, injury Neuro- denies headache, dizziness, syncope, seizure activity  Physical: GEN- NAD, alert and oriented x3,walks with cane  HEENT- PERRL, EOMI, non injected sclera, pink conjunctiva, MMM, oropharynx clear, TM clear bilat, no effusion  Neck- Supple, no thryomegaly, no bruit  CVS- RRR, no murmur RESP-CTAB Breast- normal symmetry, no nipple inversion,no nipple drainage, no nodules or lumps felt, TTP bilat medially both breast,  Nodes- no axillary nodes ABD-NABS,soft,TN,ND  EXT- No edema Pulses- Radial, DP- 2+  Assessment:    Annual wellness medicare exam   Plan:    During the course of the visit the patient was educated  and counseled about appropriate screening and preventive services including:   Given Pneumonia 23 Booster   Depression screen negative   Shoulder pain chronic no specific injury, likley more DJD based on history, has completed PT ongoing pain- refer for 2nd opinion    Asthma- currrently stable on meds   Mammogram to be scheduled- I still dont palpated any mass, but has chronic pain in both breast, likley more MSK with her fibromyalgia but will image    Osteoporosis - continue fosamax       Medicare Attestation  I have personally reviewed:  The patient's medical and social history  Their use of alcohol, tobacco or illicit drugs  Their current medications and supplements  The patient's functional ability including ADLs,fall risks, home safety risks, cognitive, and hearing and visual impairment  Diet and physical activities  Evidence for depression or mood disorders  The patient's weight, height, BMI, and visual acuity have been recorded in the chart. I have made referrals, counseling, and provided education to the patient based on review of the above and I have provided the patient with a written personalized care plan for preventive services.

## 2017-05-16 ENCOUNTER — Ambulatory Visit (HOSPITAL_COMMUNITY)
Admission: RE | Admit: 2017-05-16 | Discharge: 2017-05-16 | Disposition: A | Discharge status: Home / Self Care | Payer: BC Managed Care – PPO | Source: Ambulatory Visit | Attending: Family Medicine | Admitting: Family Medicine

## 2017-05-16 ENCOUNTER — Encounter (HOSPITAL_COMMUNITY): Payer: Self-pay

## 2017-05-16 DIAGNOSIS — Z1231 Encounter for screening mammogram for malignant neoplasm of breast: Secondary | ICD-10-CM | POA: Insufficient documentation

## 2017-05-16 DIAGNOSIS — Z1239 Encounter for other screening for malignant neoplasm of breast: Secondary | ICD-10-CM

## 2017-07-01 ENCOUNTER — Other Ambulatory Visit: Payer: Self-pay | Admitting: Family Medicine

## 2017-11-11 ENCOUNTER — Ambulatory Visit: Payer: Medicare Other | Admitting: Family Medicine

## 2017-12-03 ENCOUNTER — Ambulatory Visit (INDEPENDENT_AMBULATORY_CARE_PROVIDER_SITE_OTHER): Payer: Medicare Other | Admitting: Family Medicine

## 2017-12-03 ENCOUNTER — Encounter (HOSPITAL_COMMUNITY): Payer: Self-pay | Admitting: Occupational Therapy

## 2017-12-03 DIAGNOSIS — Z23 Encounter for immunization: Secondary | ICD-10-CM | POA: Diagnosis not present

## 2017-12-03 NOTE — Therapy (Signed)
[facility] [place], [place], [address] Phone: [phone]   Fax:  [phone]  Patient Details  Name: Jennifer Hays MRN: [id number] Date of Birth: [birth year] Referring Provider:  No ref. provider found  Encounter Date: 12/03/2017   OCCUPATIONAL THERAPY DISCHARGE SUMMARY  Visits from Start of Care: 10  Current functional level related to goals / functional outcomes: Unknown. Pt did not return to therapy after placed on hold to return to MD.   08/14/16: Reassessment completed this session as pt is leaving for vacation for 2-3 weeks and will not be able to attend therapy sessions. HEP including theraband strengthening and strengthening with weights reviewed this session. Pt has met 2/5 STGs and has made progress towards strength goal. Pt reports tingling sensation comes and goes.    Remaining deficits: Unknown.    Education / Equipment: HEP  Plan: Patient agrees to discharge.  Patient goals were not met. Patient is being discharged due to not returning since the last visit.  ?????      Guadelupe Sabin, OTR/L  [phone] 12/03/2017, 2:35 PM  [facility] [address] [place], [place], [address] Phone: [phone]   Fax:  [phone]

## 2017-12-17 ENCOUNTER — Other Ambulatory Visit: Payer: Self-pay | Admitting: *Deleted

## 2017-12-17 MED ORDER — ALENDRONATE SODIUM 70 MG PO TABS: ORAL_TABLET | ORAL | 1 refills | Status: DC

## 2017-12-29 ENCOUNTER — Telehealth: Payer: Self-pay | Admitting: Family Medicine

## 2017-12-29 MED ORDER — ALENDRONATE SODIUM 70 MG PO TABS: ORAL_TABLET | ORAL | 1 refills | Status: DC

## 2017-12-29 NOTE — Telephone Encounter (Signed)
Prescription sent to pharmacy.

## 2017-12-29 NOTE — Telephone Encounter (Signed)
Pt needs refill on fosamax to [facility].

## 2017-12-30 ENCOUNTER — Other Ambulatory Visit: Payer: Self-pay

## 2017-12-30 MED ORDER — ALENDRONATE SODIUM 70 MG PO TABS: ORAL_TABLET | ORAL | 1 refills | Status: DC

## 2017-12-30 NOTE — Progress Notes (Signed)
Patient called in stating that she had called the Pharm[facility] [place] and they had received the prescription but the script did not have a frequency. Medication was sent in and signature was updated to inform patient to take 1 tablet once a week with a full glass of water on an empty stomach.

## 2018-02-17 ENCOUNTER — Ambulatory Visit (INDEPENDENT_AMBULATORY_CARE_PROVIDER_SITE_OTHER): Payer: BC Managed Care – PPO | Admitting: Family Medicine

## 2018-02-17 ENCOUNTER — Other Ambulatory Visit: Payer: Self-pay

## 2018-02-17 ENCOUNTER — Encounter: Payer: Self-pay | Admitting: Family Medicine

## 2018-02-17 VITALS — BP 124/62 | HR 82 | Temp 97.9°F | Resp 14 | Ht 68.0 in | Wt 171.0 lb

## 2018-02-17 DIAGNOSIS — L309 Dermatitis, unspecified: Secondary | ICD-10-CM

## 2018-02-17 DIAGNOSIS — R0609 Other forms of dyspnea: Secondary | ICD-10-CM | POA: Diagnosis not present

## 2018-02-17 DIAGNOSIS — N3010 Interstitial cystitis (chronic) without hematuria: Secondary | ICD-10-CM | POA: Diagnosis not present

## 2018-02-17 DIAGNOSIS — E782 Mixed hyperlipidemia: Secondary | ICD-10-CM

## 2018-02-17 DIAGNOSIS — R0600 Dyspnea, unspecified: Secondary | ICD-10-CM

## 2018-02-17 DIAGNOSIS — J45909 Unspecified asthma, uncomplicated: Secondary | ICD-10-CM

## 2018-02-17 DIAGNOSIS — R9431 Abnormal electrocardiogram [ECG] [EKG]: Secondary | ICD-10-CM

## 2018-02-17 MED ORDER — URIBEL 118 MG PO CAPS: ORAL_CAPSULE | ORAL | 1 refills | Status: DC

## 2018-02-17 MED ORDER — CLOTRIMAZOLE-BETAMETHASONE 1-0.05 % EX CREA: 1.0000 "application " | TOPICAL_CREAM | Freq: Two times a day (BID) | CUTANEOUS | 1 refills | Status: DC

## 2018-02-17 NOTE — Assessment & Plan Note (Signed)
Her DOE is mostly her asthma, however with abnormal EKG showing concern for old infarct will have her evaluated by cardiology Will also check BNP, though she does not appear to clinically be fluid overloaded Increase advair generic to twice a day dosing

## 2018-02-17 NOTE — Patient Instructions (Signed)
We will call with lab results Use your inhaler twice a day  Referral to cardiology for evaluation Use cream for rash

## 2018-02-17 NOTE — Assessment & Plan Note (Addendum)
Lipids have been elevated, she needs repeat fasting lipid panel No current medications

## 2018-02-17 NOTE — Progress Notes (Signed)
Subjective:    Patient ID: Jennifer Hays, female    DOB: 1945-11-10, 72 y.o.   MRN: [id number]  Patient presents for Skin Irritation (L upper chet irritation- intermittent break outs with irritaiton); Tightness in Chest (intermittent tightness and SOB when she wakes up); and Bladder Issues (can't get appt with urology- would like PCP to prescribe uribel)  She here with multiple issues.   She has shortness of breath episodes, she been having some some chest tightness[place] She has been using pulmicort some, she has a new  Inhaler not sure if she  Occasionally has some pain on the left side of her chest which is non radiating,diaphorosis, no vomiting or nausea She does feel tired    She was [facility] for chronic Intersiticial cystitis has URIBEL, but difficulty getting in with urology per report, request [place] to refill this medication . She watces her diet as well which keeps her flares down  When she has a flare- she takes Uribell 2 tablets twice a day    Has had rash on left collar bone, she had had flares of this rash on and off for months. Itches, has had a rash on right side of neck, tea tree oil     Review Of Systems:  GEN- denies fatigue, fever, weight loss,weakness, recent illness HEENT- denies eye drainage, change in vision, nasal discharge, CVS- denies chest pain, palpitations RESP- denies SOB, cough, wheeze ABD- denies N/V, change in stools, abd pain GU- denies dysuria, hematuria, dribbling, incontinence MSK- denies joint pain, muscle aches, injury Neuro- denies headache, dizziness, syncope, seizure activity       Objective:    BP 124/62   Pulse 82   Temp 97.9 F (36.6 C) (Oral)   Resp 14   Ht 5\' 8"  (1.727 m)   Wt 171 lb (77.6 kg)   SpO2 98%   BMI 26.00 kg/m  GEN- NAD, alert and oriented x3 HEENT- PERRL, EOMI, non injected sclera, pink conjunctiva, MMM, oropharynx clear Neck- Supple, no thyromegaly,no  JVD CVS- RRR, no murmur RESP-Clear in lower lungs, some upper airway wheeze and cough with deep inspirations,no crackles ABD-NABS,soft,NT,ND EXT- No edema Pulses- Radial 2+ Skin- slightly raised scaley erythematous oval patch, mild central clearing, mild erythematous rash left side of neck  EKG- NSR, q waves ant leads      Assessment & Plan:      Problem List Items Addressed This Visit      Unprioritized   Hyperlipidemia    Lipids have been elevated, she needs repeat fasting lipid panel No current medications      Relevant Orders   Lipid panel   IC (interstitial cystitis)    uribell refilled, on IC diet      Intrinsic asthma - Primary    Her DOE is mostly her asthma, however with abnormal EKG showing concern for old infarct will have her evaluated by cardiology Will also check BNP, though she does not appear to clinically be fluid overloaded Increase advair generic to twice a day dosing        Other Visit Diagnoses    DOE (dyspnea on exertion)       Relevant Orders   CBC with Differential/Platelet   Comprehensive metabolic panel   Brain natriuretic peptide   EKG 12-Lead (Completed)   Ambulatory referral to Cardiology   Dermatitis       Trial of lotrisone, lesion on left side of neck,  has some central clearing so will cover for fungal as well   Abnormal EKG       Relevant Orders   Ambulatory referral to Cardiology      Note: This dictation was prepared with Dragon dictation along with smaller phrase technology. Any transcriptional errors that result from this process are unintentional.

## 2018-02-17 NOTE — Assessment & Plan Note (Signed)
uribell refilled, on IC diet

## 2018-02-18 LAB — COMPREHENSIVE METABOLIC PANEL
AG RATIO: 1.7 (calc) (ref 1.0–2.5)
ALT: 18 U/L (ref 6–29)
AST: 26 U/L (ref 10–35)
Albumin: 3.9 g/dL (ref 3.6–5.1)
Alkaline phosphatase (APISO): 68 U/L (ref 33–130)
BILIRUBIN TOTAL: 0.4 mg/dL (ref 0.2–1.2)
BUN: 12 mg/dL (ref 7–25)
CALCIUM: 9.4 mg/dL (ref 8.6–10.4)
CHLORIDE: 108 mmol/L (ref 98–110)
CO2: 29 mmol/L (ref 20–32)
Creat: 0.6 mg/dL (ref 0.60–0.93)
GLOBULIN: 2.3 g/dL (ref 1.9–3.7)
Glucose, Bld: 80 mg/dL (ref 65–99)
POTASSIUM: 4.5 mmol/L (ref 3.5–5.3)
SODIUM: 144 mmol/L (ref 135–146)
TOTAL PROTEIN: 6.2 g/dL (ref 6.1–8.1)

## 2018-02-18 LAB — CBC WITH DIFFERENTIAL/PLATELET
ABSOLUTE MONOCYTES: 426 {cells}/uL (ref 200–950)
BASOS PCT: 1 %
Basophils Absolute: 41 cells/uL (ref 0–200)
EOS ABS: 111 {cells}/uL (ref 15–500)
Eosinophils Relative: 2.7 %
HEMATOCRIT: 41.2 % (ref 35.0–45.0)
HEMOGLOBIN: 13.9 g/dL (ref 11.7–15.5)
LYMPHS ABS: 1427 {cells}/uL (ref 850–3900)
MCH: 29.6 pg (ref 27.0–33.0)
MCHC: 33.7 g/dL (ref 32.0–36.0)
MCV: 87.7 fL (ref 80.0–100.0)
MONOS PCT: 10.4 %
MPV: 13.2 fL — AB (ref 7.5–12.5)
NEUTROS ABS: 2095 {cells}/uL (ref 1500–7800)
Neutrophils Relative %: 51.1 %
Platelets: 188 10*3/uL (ref 140–400)
RBC: 4.7 10*6/uL (ref 3.80–5.10)
RDW: 13 % (ref 11.0–15.0)
Total Lymphocyte: 34.8 %
WBC: 4.1 10*3/uL (ref 3.8–10.8)

## 2018-02-18 LAB — BRAIN NATRIURETIC PEPTIDE: Brain Natriuretic Peptide: 47 pg/mL (ref <100)

## 2018-04-02 ENCOUNTER — Ambulatory Visit (INDEPENDENT_AMBULATORY_CARE_PROVIDER_SITE_OTHER): Payer: BC Managed Care – PPO | Admitting: Cardiology

## 2018-04-02 ENCOUNTER — Encounter: Payer: Self-pay | Admitting: Cardiology

## 2018-04-02 VITALS — BP 126/82 | HR 75 | Ht 68.5 in | Wt 168.0 lb

## 2018-04-02 DIAGNOSIS — R079 Chest pain, unspecified: Secondary | ICD-10-CM

## 2018-04-02 DIAGNOSIS — R0602 Shortness of breath: Secondary | ICD-10-CM

## 2018-04-02 NOTE — Progress Notes (Signed)
Clinical Summary Jennifer Hays is a 73 y.o.female seen today as a new consult, referred by Dr Buelah Manis for shortness of breath and abnormal EKG  1. DOE/Abnormal EKG -chronic and unchanged - at rest or with activity - left sided aching pains at times. Often after eating. Not positional. Lasts about 15 minutes. Can be daily. - does have history of asthma. Breathing better with inhalers.  - EKG from pcp showed SR. Compute reports possible anterior infarct but I disagree  - walks 15 minutes x 3 days without symptoms. - no recent edema.    CAD risk factors: diet controlled HL,    Husband Jennifer Hays is also a patient of mine.   Past Medical History:  Diagnosis Date  . Allergy   . Asthma   . Atrophic vaginitis   . Osteopenia      Allergies  Allergen Reactions  . Clindamycin/Lincomycin     Itching   . Penicillins     REACTION: rash, itching, difficulty breathing     Current Outpatient Medications  Medication Sig Dispense Refill  . alendronate (FOSAMAX) 70 MG tablet Take 1 tablet by mouth once a week with a full glass of water on an empty stomach 12 tablet 1  . Ascorbic Acid (VITAMIN C) 1000 MG tablet Take 500 mg by mouth daily.     . budesonide (PULMICORT) 1 MG/2ML nebulizer solution Take 2 mLs (1 mg total) by nebulization daily. 120 mL 3  . Calcium Carbonate-Vitamin D (CALCIUM 600/VITAMIN D) 600-400 MG-UNIT per tablet Take 1 tablet by mouth 2 (two) times daily.    . cetirizine (ZYRTEC) 10 MG tablet Take 10 mg by mouth daily.    . clotrimazole-betamethasone (LOTRISONE) cream Apply 1 application topically 2 (two) times daily. 30 g 1  . Fluticasone-Salmeterol (ADVAIR DISKUS) 250-50 MCG/DOSE AEPB INHALE 1 PUFF INTO THE LUNGS 2 TIMES DAILY. 180 each 3  . HYDROcodone-acetaminophen (NORCO/VICODIN) 5-325 MG per tablet Take 1 tablet by mouth every 6 (six) hours as needed for moderate pain.    Marland Kitchen lidocaine (LIDODERM) 5 % Place 1 patch onto the skin daily. Remove & Discard patch  within 12 hours or as directed by MD    . Meth-Hyo-M Bl-Na Phos-Ph Sal (URIBEL) 118 MG CAPS Take 2 capsules BID prn cystitis 60 capsule 1  . Multiple Vitamin (MULTIVITAMIN) tablet Take 1 tablet by mouth daily.    . sodium chloride (OCEAN) 0.65 % SOLN nasal spray Place 1 spray into both nostrils as needed for congestion.    . tizanidine (ZANAFLEX) 2 MG capsule Take 2 mg by mouth 3 (three) times daily.     No current facility-administered medications for this visit.      Past Surgical History:  Procedure Laterality Date  . ABDOMINAL HYSTERECTOMY    . KNEE SURGERY     Right  . SPINE SURGERY       Allergies  Allergen Reactions  . Clindamycin/Lincomycin     Itching   . Penicillins     REACTION: rash, itching, difficulty breathing      No family history on file.   Social History [name] reports that she has quit smoking. She has never used smokeless tobacco. [name] reports no history of alcohol use.   Review of Systems CONSTITUTIONAL: No weight loss, fever, chills, weakness or fatigue.  HEENT: Eyes: No visual loss, blurred vision, double vision or yellow sclerae.No hearing loss, sneezing, congestion, runny nose or sore throat.  SKIN: No rash or itching.  CARDIOVASCULAR: per hpi RESPIRATORY: per hpi GASTROINTESTINAL: No anorexia, nausea, vomiting or diarrhea. No abdominal pain or blood.  GENITOURINARY: No burning on urination, no polyuria NEUROLOGICAL: No headache, dizziness, syncope, paralysis, ataxia, numbness or tingling in the extremities. No change in bowel or bladder control.  MUSCULOSKELETAL: No muscle, back pain, joint pain or stiffness.  LYMPHATICS: No enlarged nodes. No history of splenectomy.  PSYCHIATRIC: No history of depression or anxiety.  ENDOCRINOLOGIC: No reports of sweating, cold or heat intolerance. No polyuria or polydipsia.  Marland Kitchen   Physical Examination Vitals:   04/02/18 1014  BP: 126/82  Pulse: 75  SpO2: 98%   Vitals:   04/02/18 1014    Weight: 168 lb (76.2 kg)  Height: 5' 8.5" (1.74 m)    Gen: resting comfortably, no acute distress HEENT: no scleral icterus, pupils equal round and reactive, no palptable cervical adenopathy,  CV: RRR, no m/r/g, no jvd Resp: Clear to auscultation bilaterally GI: abdomen is soft, non-tender, non-distended, normal bowel sounds, no hepatosplenomegaly MSK: extremities are warm, no edema.  Skin: warm, no rash Neuro:  no focal deficits Psych: appropriate affect     Assessment and Plan  1. SOB - agree symptoms appear secondary to asthma. Better with inhaler use, triggered by temperature changes and allergies. - atypical chest pain not cardiac in description - I would not interpret her prior EKG or repeat EKG today as showing evidence of prior infarct. - no cardiac testing planned at this time, could reevaluate if symptoms change   F/u as needed      Arnoldo Lenis, M.D.

## 2018-04-02 NOTE — Patient Instructions (Signed)
Medication Instructions:   Your physician recommends that you continue on your current medications as directed. Please refer to the Current Medication list given to you today.  Labwork:  none  Testing/Procedures:  none  Follow-Up:  Your physician recommends that you schedule a follow-up appointment in: as needed.  Any Other Special Instructions Will Be Listed Below (If Applicable).  If you need a refill on your cardiac medications before your next appointment, please call your pharmacy. 

## 2018-05-06 ENCOUNTER — Telehealth: Payer: Self-pay | Admitting: *Deleted

## 2018-05-06 MED ORDER — ALBUTEROL SULFATE (2.5 MG/3ML) 0.083% IN NEBU: 2.5000 mg | INHALATION_SOLUTION | Freq: Four times a day (QID) | RESPIRATORY_TRACT | 6 refills | Status: DC | PRN

## 2018-05-06 NOTE — Telephone Encounter (Signed)
Received call from patient.   Requested neb tx refill.   Prescription sent to pharmacy.

## 2018-05-07 ENCOUNTER — Other Ambulatory Visit: Payer: Self-pay | Admitting: Family Medicine

## 2018-06-02 ENCOUNTER — Encounter: Payer: Medicare Other | Admitting: Family Medicine

## 2018-06-26 ENCOUNTER — Telehealth: Payer: Self-pay | Admitting: Family Medicine

## 2018-06-26 MED ORDER — FLUTICASONE PROPIONATE 50 MCG/ACT NA SUSP: 2.0000 | Freq: Every day | NASAL | 6 refills | Status: DC

## 2018-06-26 MED ORDER — LORATADINE 10 MG PO TABS: 10.0000 mg | ORAL_TABLET | Freq: Every day | ORAL | 11 refills | Status: DC

## 2018-06-26 NOTE — Telephone Encounter (Signed)
MD please advise

## 2018-06-26 NOTE — Telephone Encounter (Signed)
Patient is calling to see if dr Buelah Manis will call her in something for her allergies  [phone]

## 2018-06-26 NOTE — Telephone Encounter (Signed)
Please triage, she can take zyrtec or claritin OTC  if post nasal drip can take flonase

## 2018-06-26 NOTE — Telephone Encounter (Signed)
Prescription sent to pharmacy.   Call placed to patient and patient made aware per VM. 

## 2018-07-21 ENCOUNTER — Other Ambulatory Visit (HOSPITAL_COMMUNITY): Payer: Self-pay | Admitting: Family Medicine

## 2018-07-21 DIAGNOSIS — Z1231 Encounter for screening mammogram for malignant neoplasm of breast: Secondary | ICD-10-CM

## 2018-08-05 ENCOUNTER — Ambulatory Visit (INDEPENDENT_AMBULATORY_CARE_PROVIDER_SITE_OTHER): Payer: BC Managed Care – PPO | Admitting: Family Medicine

## 2018-08-05 ENCOUNTER — Other Ambulatory Visit: Payer: Self-pay

## 2018-08-05 VITALS — BP 120/72 | HR 61 | Temp 98.5°F | Resp 18 | Ht 68.0 in | Wt 164.6 lb

## 2018-08-05 DIAGNOSIS — E782 Mixed hyperlipidemia: Secondary | ICD-10-CM | POA: Diagnosis not present

## 2018-08-05 DIAGNOSIS — M797 Fibromyalgia: Secondary | ICD-10-CM

## 2018-08-05 DIAGNOSIS — M816 Localized osteoporosis [Lequesne]: Secondary | ICD-10-CM | POA: Diagnosis not present

## 2018-08-05 DIAGNOSIS — N3010 Interstitial cystitis (chronic) without hematuria: Secondary | ICD-10-CM | POA: Diagnosis not present

## 2018-08-05 DIAGNOSIS — E559 Vitamin D deficiency, unspecified: Secondary | ICD-10-CM | POA: Diagnosis not present

## 2018-08-05 DIAGNOSIS — Z0000 Encounter for general adult medical examination without abnormal findings: Secondary | ICD-10-CM

## 2018-08-05 DIAGNOSIS — Z0001 Encounter for general adult medical examination with abnormal findings: Secondary | ICD-10-CM | POA: Diagnosis not present

## 2018-08-05 NOTE — Progress Notes (Signed)
Subjective:   Patient presents for Medicare Annual/Subsequent preventive examination.  Followed by pain clinic Dr. Brien Few, had shots for bursitis in hip    Chronic Left shoulder pain- has done physical therapy in June of 2018 after seeing Dr. Luna Glasgow, she returned last in January and no improvement in pain, especially when she puts weight onto her left hand/arm. She feels it turns colors as well   Xra showed degeneration at the [place] haing pain in both  breast, was wearing an underwire bra and that helped , pain started after a fall, I checked back in November more MSK chest wall pain  Due for screening mammogram 3/14    Asthma- has pulmicort neb, also using advair , has zyrtec    Hyperlipidemia does not tolerate cholesterol medications    Osteoporosis- on fosamax and vitamin D    Review Past Medical/Family/Social: Per EMR    Risk Factors  Current exercise habits: Little due to chronic pain  Dietary issues discussed: Yes  Cardiac risk factors:Mild hyperlipidemia  Depression Screen  (Note: if answer to either of the following is "Yes", a more complete depression screening is indicated)  Over the past two weeks, have you felt down, depressed or hopeless? No Over the past two weeks, have you felt little interest or pleasure in doing things? No Have you lost interest or pleasure in daily life? No Do you often feel hopeless? No Do you cry easily over simple problems? No   Activities of Daily Living  In your present state of health, do you have any difficulty performing the following activities?:  Driving? Yes Managing money? No  Feeding yourself? No  Getting from bed to chair? No  Climbing a flight of stairs? Yes Preparing food and eating?: No  Bathing or showering? No  Getting dressed: No  Getting to the toilet? No  Using the toilet:No  Moving around from place to place: No  In the past year have you fallen or had a near fall?:Yes  Are you sexually active?  Yes  Do you have more than one partner? No   Hearing Difficulties: No  Do you often ask people to speak up or repeat themselves? No  Do you experience ringing or noises in your ears? No Do you have difficulty understanding soft or whispered voices? No  Do you feel that you have a problem with memory? No Do you often misplace items? No  Do you feel safe at home? Yes  Cognitive Testing  Alert? Yes Normal Appearance?Yes  Oriented to person? Yes Place? Yes  Time? Yes  Recall of three objects? Yes  Can perform simple calculations? Yes  Displays appropriate judgment?Yes  Can read the correct time from a watch face?Yes   List the Names of Other Physician/Practitioners you currently use: Dr. Brien Few  Dr. Luna Glasgow   Screening Tests / Date Cologuard- Negative Feb 2018 Zostavax  Declines  Mammogram- Appointment tomorrow  Influenza Vaccine - UTD Pneumonia vaccine- UTD Tetanus/tdap UTD  Bone Density  - Due for Bone Density   ROS: GEN- denies fatigue, fever, weight loss,weakness, recent illness HEENT- denies eye drainage, change in vision, nasal discharge, CVS- denies chest pain, palpitations RESP- denies SOB, cough, wheeze ABD- denies N/V, change in stools, abd pain GU- denies dysuria, hematuria, dribbling, incontinence MSK- + joint pain, muscle aches, injury Neuro- denies headache, dizziness, syncope, seizure activity  Physical: GEN- NAD, alert and oriented x3,walks with cane  HEENT- PERRL, EOMI, non injected  sclera, pink conjunctiva, MMM, oropharynx clear, TM clear bilat, no effusion  Neck- Supple, no thryomegaly, no bruit  CVS- RRR, no murmur RESP-CTAB ABD-NABS,soft,TN,ND  EXT- No edema Pulses- Radial, DP- 2+     Assessment:    Annual wellness medicare exam   Plan:    During the course of the visit the patient was educated and counseled about appropriate screening and preventive services including:    Prevention - UTD, declines shingles vaccine    IC- doing  fairly well, recent flares, will use Urabell as needed   Osteoporosis-continue Fosamax, Bone Density to be done/ check Vitamin D    Mammogram screening tomorrow   Fasting labs    Hyperlipidemia- controlling with diet    Back pain/ Fibr- f/u ortho pain specialist for treatments       Diet review for nutrition referral? Yes ____ Not Indicated __x__  Patient Instructions (the written plan) was given to the patient.  Medicare Attestation  I have personally reviewed:  The patient's medical and social history  Their use of alcohol, tobacco or illicit drugs  Their current medications and supplements  The patient's functional ability including ADLs,fall risks, home safety risks, cognitive, and hearing and visual impairment  Diet and physical activities  Evidence for depression or mood disorders  The patient's weight, height, BMI, and visual acuity have been recorded in the chart. I have made referrals, counseling, and provided education to the patient based on review of the above and I have provided the patient with a written personalized care plan for preventive services.

## 2018-08-05 NOTE — Patient Instructions (Addendum)
Bone density and Mammogram to be done We will call with  Lab results F/U 6 months

## 2018-08-06 ENCOUNTER — Ambulatory Visit (HOSPITAL_COMMUNITY)
Admission: RE | Admit: 2018-08-06 | Discharge: 2018-08-06 | Disposition: A | Discharge status: Home / Self Care | Payer: BC Managed Care – PPO | Source: Ambulatory Visit | Attending: Family Medicine | Admitting: Family Medicine

## 2018-08-06 ENCOUNTER — Encounter: Payer: Self-pay | Admitting: Family Medicine

## 2018-08-06 DIAGNOSIS — M816 Localized osteoporosis [Lequesne]: Secondary | ICD-10-CM

## 2018-08-06 DIAGNOSIS — Z1231 Encounter for screening mammogram for malignant neoplasm of breast: Secondary | ICD-10-CM | POA: Diagnosis not present

## 2018-08-06 DIAGNOSIS — M8589 Other specified disorders of bone density and structure, multiple sites: Secondary | ICD-10-CM | POA: Diagnosis not present

## 2018-08-06 DIAGNOSIS — Z780 Asymptomatic menopausal state: Secondary | ICD-10-CM | POA: Diagnosis not present

## 2018-08-06 LAB — CBC WITH DIFFERENTIAL/PLATELET
Absolute Monocytes: 326 cells/uL (ref 200–950)
Basophils Absolute: 40 cells/uL (ref 0–200)
Basophils Relative: 0.9 %
Eosinophils Absolute: 158 cells/uL (ref 15–500)
Eosinophils Relative: 3.6 %
HCT: 44.6 % (ref 35.0–45.0)
Hemoglobin: 14.5 g/dL (ref 11.7–15.5)
Lymphs Abs: 1360 cells/uL (ref 850–3900)
MCH: 28.3 pg (ref 27.0–33.0)
MCHC: 32.5 g/dL (ref 32.0–36.0)
MCV: 87.1 fL (ref 80.0–100.0)
MPV: 12.8 fL — ABNORMAL HIGH (ref 7.5–12.5)
Monocytes Relative: 7.4 %
Neutro Abs: 2517 cells/uL (ref 1500–7800)
Neutrophils Relative %: 57.2 %
Platelets: 234 10*3/uL (ref 140–400)
RBC: 5.12 10*6/uL — ABNORMAL HIGH (ref 3.80–5.10)
RDW: 13.2 % (ref 11.0–15.0)
Total Lymphocyte: 30.9 %
WBC: 4.4 10*3/uL (ref 3.8–10.8)

## 2018-08-06 LAB — COMPREHENSIVE METABOLIC PANEL
AG Ratio: 1.8 (calc) (ref 1.0–2.5)
ALT: 14 U/L (ref 6–29)
AST: 18 U/L (ref 10–35)
Albumin: 4.3 g/dL (ref 3.6–5.1)
Alkaline phosphatase (APISO): 77 U/L (ref 37–153)
BUN: 8 mg/dL (ref 7–25)
CO2: 24 mmol/L (ref 20–32)
Calcium: 9.5 mg/dL (ref 8.6–10.4)
Chloride: 104 mmol/L (ref 98–110)
Creat: 0.7 mg/dL (ref 0.60–0.93)
Globulin: 2.4 g/dL (calc) (ref 1.9–3.7)
Glucose, Bld: 89 mg/dL (ref 65–99)
Potassium: 4.2 mmol/L (ref 3.5–5.3)
Sodium: 138 mmol/L (ref 135–146)
Total Bilirubin: 0.6 mg/dL (ref 0.2–1.2)
Total Protein: 6.7 g/dL (ref 6.1–8.1)

## 2018-08-06 LAB — VITAMIN D 25 HYDROXY (VIT D DEFICIENCY, FRACTURES): Vit D, 25-Hydroxy: 27 ng/mL — ABNORMAL LOW (ref 30–100)

## 2018-08-06 LAB — LIPID PANEL
Cholesterol: 220 mg/dL — ABNORMAL HIGH (ref <200)
HDL: 46 mg/dL — ABNORMAL LOW (ref >=50)
LDL Cholesterol (Calc): 148 mg/dL (calc) — ABNORMAL HIGH
Non-HDL Cholesterol (Calc): 174 mg/dL (calc) — ABNORMAL HIGH (ref <130)
Total CHOL/HDL Ratio: 4.8 (calc) (ref <5.0)
Triglycerides: 132 mg/dL (ref <150)

## 2018-09-18 ENCOUNTER — Other Ambulatory Visit: Payer: Self-pay | Admitting: Family Medicine

## 2018-11-24 ENCOUNTER — Other Ambulatory Visit: Payer: Self-pay | Admitting: Family Medicine

## 2018-11-24 ENCOUNTER — Other Ambulatory Visit: Payer: Self-pay

## 2018-11-24 MED ORDER — ALENDRONATE SODIUM 70 MG PO TABS: ORAL_TABLET | ORAL | 1 refills | Status: DC

## 2018-12-03 ENCOUNTER — Other Ambulatory Visit: Payer: Self-pay

## 2018-12-03 ENCOUNTER — Ambulatory Visit (INDEPENDENT_AMBULATORY_CARE_PROVIDER_SITE_OTHER): Payer: Medicare Other

## 2018-12-03 DIAGNOSIS — Z23 Encounter for immunization: Secondary | ICD-10-CM | POA: Diagnosis not present

## 2019-01-08 ENCOUNTER — Ambulatory Visit (HOSPITAL_COMMUNITY)
Admission: RE | Admit: 2019-01-08 | Discharge: 2019-01-08 | Disposition: A | Discharge status: Home / Self Care | Payer: BC Managed Care – PPO | Source: Ambulatory Visit | Attending: Family Medicine | Admitting: Family Medicine

## 2019-01-08 ENCOUNTER — Ambulatory Visit (INDEPENDENT_AMBULATORY_CARE_PROVIDER_SITE_OTHER): Payer: BC Managed Care – PPO | Admitting: Family Medicine

## 2019-01-08 ENCOUNTER — Encounter: Payer: Self-pay | Admitting: Family Medicine

## 2019-01-08 ENCOUNTER — Other Ambulatory Visit: Payer: Self-pay

## 2019-01-08 VITALS — BP 118/64 | HR 70 | Temp 98.4°F | Resp 14 | Ht 68.0 in | Wt 165.0 lb

## 2019-01-08 DIAGNOSIS — M797 Fibromyalgia: Secondary | ICD-10-CM

## 2019-01-08 DIAGNOSIS — M542 Cervicalgia: Secondary | ICD-10-CM | POA: Insufficient documentation

## 2019-01-08 DIAGNOSIS — E559 Vitamin D deficiency, unspecified: Secondary | ICD-10-CM | POA: Diagnosis not present

## 2019-01-08 DIAGNOSIS — M816 Localized osteoporosis [Lequesne]: Secondary | ICD-10-CM

## 2019-01-08 DIAGNOSIS — E782 Mixed hyperlipidemia: Secondary | ICD-10-CM | POA: Diagnosis not present

## 2019-01-08 DIAGNOSIS — R5383 Other fatigue: Secondary | ICD-10-CM

## 2019-01-08 DIAGNOSIS — L821 Other seborrheic keratosis: Secondary | ICD-10-CM

## 2019-01-08 NOTE — Assessment & Plan Note (Signed)
Chronic fatigue is likely linked to her fibromyalgia

## 2019-01-08 NOTE — Progress Notes (Signed)
Subjective:    Patient ID: Jennifer Hays, female    DOB: [birth year], 73 y.o.   MRN: [id number]  Patient presents for Follow-up (is fasting), Fatigue (would like to have Vit D and Iron re-checked), and Mole to R Arm (changes to mole on arm)   Pt here to f/u chronic medical problems.Medications reviewed   Chronic fatigue- request iron check with her CBC as well, some days overly fatigued other days has some energy    Osteoporosis on fosamax and calcium and Vitamin D dEF- she would like to have vitamin D rechecked, currently on 3000IU daily of vitamin D, not taking calcium she forgot to take    Hyperlipidemia - she is eating mostly vegan, trying to control cholesterol with diet    She has had neck pain for months and gets some headaches at the back her neck, occasionally she gets tingling and numbness in her fingertips but this is not constant she has chronic joint pain , uses zanflex as needed for joints , also uses lidoderm   She is still followed by Dr. Brien Few for her chronic hip pain- December is next appt     She has underlying allergies- using nasal saline    Tiny growth on right inner forearm, no pain  Review Of Systems:  GEN- denies fatigue, fever, weight loss,weakness, recent illness HEENT- denies eye drainage, change in vision, nasal discharge, CVS- denies chest pain, palpitations RESP- denies SOB, cough, wheeze ABD- denies N/V, change in stools, abd pain GU- denies dysuria, hematuria, dribbling, incontinence MSK- + joint pain, muscle aches, injury Neuro- denies headache, dizziness, syncope, seizure activity       Objective:    BP 118/64   Pulse 70   Temp 98.4 F (36.9 C) (Temporal)   Resp 14   Ht 5\' 8"  (1.727 m)   Wt 165 lb (74.8 kg)   SpO2 98%   BMI 25.09 kg/m  GEN- NAD, alert and oriented x3 HEENT- PERRL, EOMI, non injected sclera, pink conjunctiva, MMM, oropharynx clear Neck- Supple, no thyromegaly, anterior motion of cervical spine tenderness to palpation  at the base of the cervical spine no spasms noted CVS- RRR, no murmur RESP-CTAB ABD-NABS,soft,NT,ND Neuro cranial nerves II through XII grossly intact normal tone no lower extreme upper extremities sensation grossly intact strength equal bilaterally upper extremities Skin small scaly skin toned lesion inner her right arm EXT- No edema Pulses- Radial, DP- 2+  Cryotherapy verbal consent obtained.  Liquid nitrogen applied via spray gun 5 to 10 seconds x 3 passes on seborrheic keratoses of right inner forearm patient tolerated without difficulty.  Explained that she may blister she can keep clean and covered.      Assessment & Plan:      Problem List Items Addressed This Visit      Unprioritized   Fibromyalgia    Chronic fatigue is likely linked to her fibromyalgia      Hyperlipidemia    Lipid panel she is trying to treat with diet, does not  tolerate statin drugs      Relevant Orders   CBC with Differential   Comprehensive metabolic panel   Lipid Panel   Osteoporosis    She does have underlying vitamin D deficiency we will recheck her vitamin D levels.  Continue Fosamax.      Relevant Medications   cholecalciferol (VITAMIN D3) 25 MCG (1000 UT) tablet   Vitamin D deficiency   Relevant Orders   Vitamin D, 25-hydroxy  Other Visit Diagnoses    Seborrheic keratoses    -  Primary   Cryotherapy applied, benign lesion   Other fatigue       Relevant Orders   CBC with Differential   Iron, TIBC and Ferritin Panel   Neck pain       Obtain x-ray of C-spine likely degenerative disc disease.  sHe may have some nerve impingement with her paresthesias that she has been having intermittently in her hands.  She is seeing Dr. Brien Few, if MRI or injections needed with get consult   Relevant Orders   DG Cervical Spine Complete      Note: This dictation was prepared with Dragon dictation along with smaller phrase technology. Any transcriptional errors that result from this process are  unintentional.

## 2019-01-08 NOTE — Assessment & Plan Note (Signed)
Lipid panel she is trying to treat with diet, does not  tolerate statin drugs

## 2019-01-08 NOTE — Patient Instructions (Addendum)
F/U June for Physical  We will call with lab results  Get the xray of your neck AT  [facility]

## 2019-01-08 NOTE — Assessment & Plan Note (Signed)
She does have underlying vitamin D deficiency we will recheck her vitamin D levels.  Continue Fosamax.

## 2019-01-09 LAB — IRON,TIBC AND FERRITIN PANEL
%SAT: 37 % (calc) (ref 16–45)
Ferritin: 154 ng/mL (ref 16–288)
Iron: 115 ug/dL (ref 45–160)
TIBC: 307 mcg/dL (calc) (ref 250–450)

## 2019-01-09 LAB — CBC WITH DIFFERENTIAL/PLATELET
Absolute Monocytes: 382 cells/uL (ref 200–950)
Basophils Absolute: 39 cells/uL (ref 0–200)
Basophils Relative: 1.1 %
Eosinophils Absolute: 60 cells/uL (ref 15–500)
Eosinophils Relative: 1.7 %
HCT: 43.3 % (ref 35.0–45.0)
Hemoglobin: 14.3 g/dL (ref 11.7–15.5)
Lymphs Abs: 1425 cells/uL (ref 850–3900)
MCH: 29.4 pg (ref 27.0–33.0)
MCHC: 33 g/dL (ref 32.0–36.0)
MCV: 89.1 fL (ref 80.0–100.0)
MPV: 13 fL — ABNORMAL HIGH (ref 7.5–12.5)
Monocytes Relative: 10.9 %
Neutro Abs: 1596 cells/uL (ref 1500–7800)
Neutrophils Relative %: 45.6 %
Platelets: 188 10*3/uL (ref 140–400)
RBC: 4.86 10*6/uL (ref 3.80–5.10)
RDW: 13.1 % (ref 11.0–15.0)
Total Lymphocyte: 40.7 %
WBC: 3.5 10*3/uL — ABNORMAL LOW (ref 3.8–10.8)

## 2019-01-09 LAB — COMPREHENSIVE METABOLIC PANEL
AG Ratio: 1.6 (calc) (ref 1.0–2.5)
ALT: 21 U/L (ref 6–29)
AST: 24 U/L (ref 10–35)
Albumin: 4.2 g/dL (ref 3.6–5.1)
Alkaline phosphatase (APISO): 73 U/L (ref 37–153)
BUN: 8 mg/dL (ref 7–25)
CO2: 26 mmol/L (ref 20–32)
Calcium: 9.5 mg/dL (ref 8.6–10.4)
Chloride: 107 mmol/L (ref 98–110)
Creat: 0.67 mg/dL (ref 0.60–0.93)
Globulin: 2.6 g/dL (calc) (ref 1.9–3.7)
Glucose, Bld: 90 mg/dL (ref 65–99)
Potassium: 4.4 mmol/L (ref 3.5–5.3)
Sodium: 142 mmol/L (ref 135–146)
Total Bilirubin: 0.8 mg/dL (ref 0.2–1.2)
Total Protein: 6.8 g/dL (ref 6.1–8.1)

## 2019-01-09 LAB — LIPID PANEL
Cholesterol: 247 mg/dL — ABNORMAL HIGH (ref <200)
HDL: 48 mg/dL — ABNORMAL LOW (ref >=50)
LDL Cholesterol (Calc): 167 mg/dL (calc) — ABNORMAL HIGH
Non-HDL Cholesterol (Calc): 199 mg/dL (calc) — ABNORMAL HIGH (ref <130)
Total CHOL/HDL Ratio: 5.1 (calc) — ABNORMAL HIGH (ref <5.0)
Triglycerides: 171 mg/dL — ABNORMAL HIGH (ref <150)

## 2019-01-09 LAB — VITAMIN D 25 HYDROXY (VIT D DEFICIENCY, FRACTURES): Vit D, 25-Hydroxy: 28 ng/mL — ABNORMAL LOW (ref 30–100)

## 2019-01-12 ENCOUNTER — Encounter: Payer: Self-pay | Admitting: Family Medicine

## 2019-01-18 ENCOUNTER — Other Ambulatory Visit: Payer: Self-pay

## 2019-01-18 MED ORDER — FENOFIBRATE 48 MG PO TABS: 48.0000 mg | ORAL_TABLET | ORAL | 0 refills | Status: DC

## 2019-01-18 MED ORDER — FENOFIBRATE 48 MG PO TABS: 48.0000 mg | ORAL_TABLET | Freq: Three times a day (TID) | ORAL | 0 refills | Status: DC

## 2019-02-18 ENCOUNTER — Other Ambulatory Visit: Payer: Self-pay | Admitting: Family Medicine

## 2019-03-01 ENCOUNTER — Other Ambulatory Visit: Payer: Self-pay | Admitting: Family Medicine

## 2019-04-20 ENCOUNTER — Other Ambulatory Visit: Payer: Self-pay | Admitting: Family Medicine

## 2019-05-11 ENCOUNTER — Other Ambulatory Visit: Payer: Self-pay | Admitting: *Deleted

## 2019-05-11 MED ORDER — LORATADINE 10 MG PO TABS: 10.0000 mg | ORAL_TABLET | Freq: Every day | ORAL | 11 refills | Status: DC

## 2019-07-08 ENCOUNTER — Other Ambulatory Visit: Payer: Self-pay | Admitting: Family Medicine

## 2019-08-10 ENCOUNTER — Telehealth: Payer: Self-pay | Admitting: *Deleted

## 2019-08-10 ENCOUNTER — Encounter: Payer: Self-pay | Admitting: Family Medicine

## 2019-08-10 ENCOUNTER — Ambulatory Visit (INDEPENDENT_AMBULATORY_CARE_PROVIDER_SITE_OTHER): Payer: BC Managed Care – PPO | Admitting: Family Medicine

## 2019-08-10 ENCOUNTER — Other Ambulatory Visit: Payer: Self-pay

## 2019-08-10 VITALS — BP 112/60 | HR 72 | Temp 98.1°F | Resp 14 | Ht 68.0 in | Wt 166.0 lb

## 2019-08-10 DIAGNOSIS — Z0001 Encounter for general adult medical examination with abnormal findings: Secondary | ICD-10-CM

## 2019-08-10 DIAGNOSIS — Z1211 Encounter for screening for malignant neoplasm of colon: Secondary | ICD-10-CM

## 2019-08-10 DIAGNOSIS — E559 Vitamin D deficiency, unspecified: Secondary | ICD-10-CM | POA: Diagnosis not present

## 2019-08-10 DIAGNOSIS — E782 Mixed hyperlipidemia: Secondary | ICD-10-CM | POA: Diagnosis not present

## 2019-08-10 DIAGNOSIS — N3010 Interstitial cystitis (chronic) without hematuria: Secondary | ICD-10-CM

## 2019-08-10 DIAGNOSIS — R5382 Chronic fatigue, unspecified: Secondary | ICD-10-CM | POA: Diagnosis not present

## 2019-08-10 DIAGNOSIS — M816 Localized osteoporosis [Lequesne]: Secondary | ICD-10-CM | POA: Diagnosis not present

## 2019-08-10 DIAGNOSIS — Z0000 Encounter for general adult medical examination without abnormal findings: Secondary | ICD-10-CM

## 2019-08-10 MED ORDER — BUDESONIDE 1 MG/2ML IN SUSP: 1.0000 mg | Freq: Every day | RESPIRATORY_TRACT | 3 refills | Status: DC

## 2019-08-10 MED ORDER — FENOFIBRATE 48 MG PO TABS: 48.0000 mg | ORAL_TABLET | ORAL | 3 refills | Status: DC

## 2019-08-10 MED ORDER — ALBUTEROL SULFATE (2.5 MG/3ML) 0.083% IN NEBU: 2.5000 mg | INHALATION_SOLUTION | Freq: Four times a day (QID) | RESPIRATORY_TRACT | 6 refills | Status: DC | PRN

## 2019-08-10 MED ORDER — ALENDRONATE SODIUM 70 MG PO TABS: ORAL_TABLET | ORAL | 1 refills | Status: DC

## 2019-08-10 MED ORDER — URO-MP 118 MG PO CAPS: ORAL_CAPSULE | ORAL | 1 refills | Status: DC

## 2019-08-10 NOTE — Telephone Encounter (Signed)
Patient due for repeat Cologuard.   Cologuard (Order [id number])

## 2019-08-10 NOTE — Telephone Encounter (Signed)
Call placed to patient and patient made aware.  

## 2019-08-10 NOTE — Telephone Encounter (Signed)
Received call from patient.   Reports that she called to schedule DEXA, but was advised that last scan is 08/06/2018. Inquired as to if she requires scan again so soon.   MD please advise.

## 2019-08-10 NOTE — Patient Instructions (Addendum)
Schedule bone density  Cologuard to be sent to home for colon cancer screening We will call with lab results F/U 6 months

## 2019-08-10 NOTE — Telephone Encounter (Signed)
Yes, because she is on active treatment I want to check it again

## 2019-08-10 NOTE — Progress Notes (Signed)
Subjective:   Patient presents for Medicare Annual/Subsequent preventive examination.  Followed by pain clinic Dr. Brien Few, had shots for bursitis in hip, hip continues to have pain, she his scheduled for radiofrequency ablation for her right hip  Chronic Insterstitial  cystitis - if she eats different foods, she has flares , needs her urobel refilled        Asthma- has pulmicort neb, also using advair , has zyrtec , no recent exacerbations    Hyperlipidemia does not tolerate cholesterol medications    Osteoporosis- on fosamax and vitamin D due for repeat vitamin D   Fibromyalgia- she is fatigued all the time, she is taking vitamin D 5000IU daily,   she is also on MVI   She does Yoga and walks 45 minutes a day   Review Past Medical/Family/Social: Per EMR    Risk Factors  Current exercise habits: Yoga/Walkng  Dietary issues discussed: Yes  Cardiac risk factors:Mild hyperlipidemia  Depression Screen  (Note: if answer to either of the following is "Yes", a more complete depression screening is indicated)  Over the past two weeks, have you felt down, depressed or hopeless? No Over the past two weeks, have you felt little interest or pleasure in doing things? No Have you lost interest or pleasure in daily life? No Do you often feel hopeless? No Do you cry easily over simple problems? No   Activities of Daily Living  In your present state of health, do you have any difficulty performing the following activities?:  Driving? Yes Managing money? No  Feeding yourself? No  Getting from bed to chair? No  Climbing a flight of stairs? Yes Preparing food and eating?: No  Bathing or showering? No  Getting dressed: No  Getting to the toilet? No  Using the toilet:No  Moving around from place to place: No  In the past year have you fallen or had a near fall?:Yes  Are you sexually active? Yes  Do you have more than one partner? No   Hearing Difficulties: No  Do you often  ask people to speak up or repeat themselves? No  Do you experience ringing or noises in your ears? No Do you have difficulty understanding soft or whispered voices? No  Do you feel that you have a problem with memory? No Do you often misplace items? No  Do you feel safe at home? Yes  Cognitive Testing  Alert? Yes Normal Appearance?Yes  Oriented to person? Yes Place? Yes  Time? Yes  Recall of three objects? Yes  Can perform simple calculations? Yes  Displays appropriate judgment?Yes  Can read the correct time from a watch face?Yes   List the Names of Other Physician/Practitioners you currently use: Dr. Brien Few  Dr. Luna Glasgow   Screening Tests / Date Cologuard- Negative Feb 2018 ZostavaxDeclines Mammogram- Appointment tomorrow  Influenza Vaccine- UTD Pneumonia vaccine- UTD Tetanus/tdapUTD  Bone Density  - Due for Bone Density   ROS: GEN- denies fatigue, fever, weight loss,weakness, recent illness HEENT- denies eye drainage, change in vision, nasal discharge, CVS- denies chest pain, palpitations RESP- denies SOB, cough, wheeze ABD- denies N/V, change in stools, abd pain GU- denies dysuria, hematuria, dribbling, incontinence MSK-+joint pain, +muscle aches, injury Neuro- denies headache, dizziness, syncope, seizure activity  Physical: GEN- NAD, alert and oriented x3,walks with cane  HEENT- PERRL, EOMI, non injected sclera, pink conjunctiva, MMM, oropharynx clear, TM clear bilat, no effusion Neck- Supple, no thryomegaly, no bruit  CVS- RRR, no murmur RESP-CTAB ABD-NABS,soft,ND, mild TTP suprapubic and  LLQ EXT- No edema Pulses- Radial, DP- 2+     Assessment:    Annual wellness medicare exam   Plan:    During the course of the visit the patient was educated and counseled about appropriate screening and preventive services including:    Prevention - UTD, declines shingles vaccine   fall/depression/cage    IC- doing fairly well, recent flares,  will use Urabell as needed refilled today  Asthma no exacerbations    Osteoporosis-continue Fosamax, Bone Density to be done/ check Vitamin D    Mammogram screening tomorrow   Fasting labs    Hyperlipidemia- controlling with diet , tricor three times a week   Back pain/ Fibr- f/u ortho pain specialist for treatments   Cologuard for colon cancer screening    Diet review for nutrition referral? Yes ____ Not Indicated __x__  Patient Instructions (the written plan) was given to the patient.  Medicare Attestation  I have personally reviewed:  The patient's medical and social history  Their use of alcohol, tobacco or illicit drugs  Their current medications and supplements  The patient's functional ability including ADLs,fall risks, home safety risks, cognitive, and hearing and visual impairment  Diet and physical activities  Evidence for depression or mood disorders  The patient's weight, height, BMI, and visual acuity have been recorded in the chart. I have made referrals, counseling, and provided education to the patient based on review of the above and I have provided the patient with a written personalized care plan for preventive services.

## 2019-08-11 ENCOUNTER — Encounter: Payer: Self-pay | Admitting: Family Medicine

## 2019-08-11 LAB — CBC WITH DIFFERENTIAL/PLATELET
Absolute Monocytes: 361 cells/uL (ref 200–950)
Basophils Absolute: 21 cells/uL (ref 0–200)
Basophils Relative: 0.6 %
Eosinophils Absolute: 91 cells/uL (ref 15–500)
Eosinophils Relative: 2.6 %
HCT: 42.4 % (ref 35.0–45.0)
Hemoglobin: 14 g/dL (ref 11.7–15.5)
Lymphs Abs: 1267 cells/uL (ref 850–3900)
MCH: 29.3 pg (ref 27.0–33.0)
MCHC: 33 g/dL (ref 32.0–36.0)
MCV: 88.7 fL (ref 80.0–100.0)
MPV: 12.7 fL — ABNORMAL HIGH (ref 7.5–12.5)
Monocytes Relative: 10.3 %
Neutro Abs: 1761 cells/uL (ref 1500–7800)
Neutrophils Relative %: 50.3 %
Platelets: 199 10*3/uL (ref 140–400)
RBC: 4.78 10*6/uL (ref 3.80–5.10)
RDW: 12.6 % (ref 11.0–15.0)
Total Lymphocyte: 36.2 %
WBC: 3.5 10*3/uL — ABNORMAL LOW (ref 3.8–10.8)

## 2019-08-11 LAB — LIPID PANEL
Cholesterol: 256 mg/dL — ABNORMAL HIGH (ref <200)
HDL: 53 mg/dL (ref >=50)
LDL Cholesterol (Calc): 172 mg/dL (calc) — ABNORMAL HIGH
Non-HDL Cholesterol (Calc): 203 mg/dL (calc) — ABNORMAL HIGH (ref <130)
Total CHOL/HDL Ratio: 4.8 (calc) (ref <5.0)
Triglycerides: 163 mg/dL — ABNORMAL HIGH (ref <150)

## 2019-08-11 LAB — COMPREHENSIVE METABOLIC PANEL
AG Ratio: 2 (calc) (ref 1.0–2.5)
ALT: 19 U/L (ref 6–29)
AST: 22 U/L (ref 10–35)
Albumin: 4.4 g/dL (ref 3.6–5.1)
Alkaline phosphatase (APISO): 70 U/L (ref 37–153)
BUN: 10 mg/dL (ref 7–25)
CO2: 27 mmol/L (ref 20–32)
Calcium: 9.5 mg/dL (ref 8.6–10.4)
Chloride: 104 mmol/L (ref 98–110)
Creat: 0.65 mg/dL (ref 0.60–0.93)
Globulin: 2.2 g/dL (calc) (ref 1.9–3.7)
Glucose, Bld: 92 mg/dL (ref 65–99)
Potassium: 4 mmol/L (ref 3.5–5.3)
Sodium: 140 mmol/L (ref 135–146)
Total Bilirubin: 0.6 mg/dL (ref 0.2–1.2)
Total Protein: 6.6 g/dL (ref 6.1–8.1)

## 2019-08-11 LAB — TSH: TSH: 1.12 mIU/L (ref 0.40–4.50)

## 2019-08-11 LAB — IRON,TIBC AND FERRITIN PANEL
%SAT: 31 % (calc) (ref 16–45)
Ferritin: 118 ng/mL (ref 16–288)
Iron: 96 ug/dL (ref 45–160)
TIBC: 307 mcg/dL (calc) (ref 250–450)

## 2019-08-11 LAB — VITAMIN D 25 HYDROXY (VIT D DEFICIENCY, FRACTURES): Vit D, 25-Hydroxy: 43 ng/mL (ref 30–100)

## 2019-08-13 ENCOUNTER — Other Ambulatory Visit: Payer: Self-pay

## 2019-08-13 ENCOUNTER — Ambulatory Visit (HOSPITAL_COMMUNITY)
Admission: RE | Admit: 2019-08-13 | Discharge: 2019-08-13 | Disposition: A | Discharge status: Home / Self Care | Payer: BC Managed Care – PPO | Source: Ambulatory Visit | Attending: Family Medicine | Admitting: Family Medicine

## 2019-08-13 DIAGNOSIS — M818 Other osteoporosis without current pathological fracture: Secondary | ICD-10-CM | POA: Diagnosis not present

## 2019-08-13 DIAGNOSIS — M816 Localized osteoporosis [Lequesne]: Secondary | ICD-10-CM

## 2019-08-13 DIAGNOSIS — M810 Age-related osteoporosis without current pathological fracture: Secondary | ICD-10-CM | POA: Diagnosis not present

## 2019-09-07 ENCOUNTER — Encounter: Payer: Self-pay | Admitting: Family Medicine

## 2019-09-07 LAB — COLOGUARD: Cologuard: NEGATIVE

## 2019-09-13 LAB — COLOGUARD: COLOGUARD: NEGATIVE

## 2019-09-16 NOTE — Telephone Encounter (Signed)
Received the results of Cologuard screening.   Screening noted negative.   A negative result indicates a low likelihood of colorectal cancer is present. Following a negative Cologuard result, the [employer] recommends a Cologuard re-screening interval of 3 years.   Letter sent.   

## 2019-11-04 ENCOUNTER — Other Ambulatory Visit: Payer: Self-pay | Admitting: *Deleted

## 2019-11-04 DIAGNOSIS — J45909 Unspecified asthma, uncomplicated: Secondary | ICD-10-CM

## 2019-11-04 DIAGNOSIS — R0609 Other forms of dyspnea: Secondary | ICD-10-CM

## 2019-11-09 DIAGNOSIS — Z23 Encounter for immunization: Secondary | ICD-10-CM | POA: Diagnosis not present

## 2019-12-09 DIAGNOSIS — Z23 Encounter for immunization: Secondary | ICD-10-CM | POA: Diagnosis not present

## 2020-02-09 ENCOUNTER — Other Ambulatory Visit: Payer: Self-pay

## 2020-02-09 ENCOUNTER — Ambulatory Visit (INDEPENDENT_AMBULATORY_CARE_PROVIDER_SITE_OTHER): Payer: BC Managed Care – PPO | Admitting: Family Medicine

## 2020-02-09 ENCOUNTER — Ambulatory Visit (HOSPITAL_COMMUNITY)
Admission: RE | Admit: 2020-02-09 | Discharge: 2020-02-09 | Disposition: A | Discharge status: Home / Self Care | Payer: BC Managed Care – PPO | Source: Ambulatory Visit | Attending: Family Medicine | Admitting: Family Medicine

## 2020-02-09 ENCOUNTER — Encounter: Payer: Self-pay | Admitting: Family Medicine

## 2020-02-09 VITALS — BP 122/70 | HR 63 | Temp 97.4°F | Ht 68.0 in | Wt 171.0 lb

## 2020-02-09 DIAGNOSIS — W19XXXD Unspecified fall, subsequent encounter: Secondary | ICD-10-CM | POA: Insufficient documentation

## 2020-02-09 DIAGNOSIS — E782 Mixed hyperlipidemia: Secondary | ICD-10-CM | POA: Diagnosis not present

## 2020-02-09 DIAGNOSIS — M25551 Pain in right hip: Secondary | ICD-10-CM | POA: Diagnosis not present

## 2020-02-09 DIAGNOSIS — M816 Localized osteoporosis [Lequesne]: Secondary | ICD-10-CM

## 2020-02-09 DIAGNOSIS — M797 Fibromyalgia: Secondary | ICD-10-CM

## 2020-02-09 DIAGNOSIS — J45909 Unspecified asthma, uncomplicated: Secondary | ICD-10-CM

## 2020-02-09 DIAGNOSIS — N3010 Interstitial cystitis (chronic) without hematuria: Secondary | ICD-10-CM | POA: Diagnosis not present

## 2020-02-09 NOTE — Assessment & Plan Note (Signed)
Continue current meds Flu shot UTD

## 2020-02-09 NOTE — Assessment & Plan Note (Signed)
Urabell prn

## 2020-02-09 NOTE — Assessment & Plan Note (Signed)
Chronic pain For hip and joints trying to get lidocaine topical Will obtain xray hip s/p fall She has some soreness in neck but no fall or impact on neck F/u pain clinic

## 2020-02-09 NOTE — Progress Notes (Signed)
Subjective:    Patient ID: Jennifer Hays, female    DOB: 1945/12/14, 73 y.o.   MRN: [id number]  Patient presents for Medical Management of Chronic Issues (6 m f/u) and had seveal falls  IC- doing fairly well, recent flares, will use Urabell as needed   Asthma - she has had some mild symptoms which change in weather but overall doing well   Osteoporosis-continues on  Fosamax, Bone Density to be done/ check Vitamin D   Mammogram screening tomorrow   Hyperlipidemia- controlling with diet , tricor three times a week  Back pain/ Fibr- f/u ortho pain specialist for treatments    She had a fall on Saturday, her right leg gave out on her, she doesn't remember anything in particular   She hurts all over, but often with falls hurts more on right side,  she hurts in her neck, right arm and right thumb with bending                      Pain in right hip over bursa                      Taking hydrocodone for pain                      She is unable to get lidoderm patches, she ad lidocaine cream sent in by pain management, she is still on zanaflex                     Review Of Systems:  GEN- denies fatigue, fever, weight loss,weakness, recent illness HEENT- denies eye drainage, change in vision, nasal discharge, CVS- denies chest pain, palpitations RESP- denies SOB, cough, wheeze ABD- denies N/V, change in stools, abd pain GU- denies dysuria, hematuria, dribbling, incontinence MSK- + joint pain, muscle aches, injury Neuro- denies headache, dizziness, syncope, seizure activity       Objective:    BP 122/70   Pulse 63   Temp (!) 97.4 F (36.3 C) (Temporal)   Ht 5\' 8"  (1.727 m)   Wt 171 lb (77.6 kg)   SpO2 97%   BMI 26.00 kg/m  GEN- NAD, alert and oriented x3 HEENT- PERRL, EOMI, non injected sclera, pink conjunctiva, MMM, oropharynx clear Neck- Supple, fair ROM CVS- RRR, no murmur RESP-CTAB ABD-NABS,soft,NT,ND MSK FAIR ROM upper ext, rotator cuff in tact, mild TTP  base of spine, mild spasm Able to make fist, no swelling thumb  TTP right hip, pain with palpation and hip EXT- No edema Pulses- Radial, DP- 2+        Assessment & Plan:      Problem List Items Addressed This Visit      Unprioritized   Fibromyalgia    Chronic pain For hip and joints trying to get lidocaine topical Will obtain xray hip s/p fall She has some soreness in neck but no fall or impact on neck F/u pain clinic       Hyperlipidemia   Relevant Orders   CBC with Differential/Platelet (Completed)   Comprehensive metabolic panel   Lipid panel   IC (interstitial cystitis)    Urabell prn      Intrinsic asthma - Primary    Continue current meds Flu shot UTD      Osteoporosis    Continue fosamax, vitamin D       Other Visit Diagnoses    Right hip pain  Relevant Orders   DG Hip Unilat W OR W/O Pelvis 2-3 Views Right (Completed)   Fall, subsequent encounter       Relevant Orders   DG Hip Unilat W OR W/O Pelvis 2-3 Views Right (Completed)      Note: This dictation was prepared with Dragon dictation along with smaller phrase technology. Any transcriptional errors that result from this process are unintentional.

## 2020-02-09 NOTE — Patient Instructions (Signed)
Xray to be done today  We will call with lab results  F/U 6 months

## 2020-02-09 NOTE — Assessment & Plan Note (Signed)
Continue fosamax, vitamin D

## 2020-02-10 LAB — CBC WITH DIFFERENTIAL/PLATELET
Absolute Monocytes: 284 cells/uL (ref 200–950)
Basophils Absolute: 40 cells/uL (ref 0–200)
Basophils Relative: 1.1 %
Eosinophils Absolute: 72 cells/uL (ref 15–500)
Eosinophils Relative: 2 %
HCT: 43.6 % (ref 35.0–45.0)
Hemoglobin: 14.4 g/dL (ref 11.7–15.5)
Lymphs Abs: 1400 cells/uL (ref 850–3900)
MCH: 29.3 pg (ref 27.0–33.0)
MCHC: 33 g/dL (ref 32.0–36.0)
MCV: 88.6 fL (ref 80.0–100.0)
MPV: 12.5 fL (ref 7.5–12.5)
Monocytes Relative: 7.9 %
Neutro Abs: 1804 cells/uL (ref 1500–7800)
Neutrophils Relative %: 50.1 %
Platelets: 189 10*3/uL (ref 140–400)
RBC: 4.92 10*6/uL (ref 3.80–5.10)
RDW: 12.6 % (ref 11.0–15.0)
Total Lymphocyte: 38.9 %
WBC: 3.6 10*3/uL — ABNORMAL LOW (ref 3.8–10.8)

## 2020-02-10 LAB — LIPID PANEL
Cholesterol: 239 mg/dL — ABNORMAL HIGH (ref <200)
HDL: 55 mg/dL (ref >=50)
LDL Cholesterol (Calc): 153 mg/dL (calc) — ABNORMAL HIGH
Non-HDL Cholesterol (Calc): 184 mg/dL (calc) — ABNORMAL HIGH (ref <130)
Total CHOL/HDL Ratio: 4.3 (calc) (ref <5.0)
Triglycerides: 169 mg/dL — ABNORMAL HIGH (ref <150)

## 2020-02-10 LAB — COMPREHENSIVE METABOLIC PANEL
AG Ratio: 1.8 (calc) (ref 1.0–2.5)
ALT: 20 U/L (ref 6–29)
AST: 23 U/L (ref 10–35)
Albumin: 4.3 g/dL (ref 3.6–5.1)
Alkaline phosphatase (APISO): 62 U/L (ref 37–153)
BUN: 10 mg/dL (ref 7–25)
CO2: 27 mmol/L (ref 20–32)
Calcium: 9.5 mg/dL (ref 8.6–10.4)
Chloride: 106 mmol/L (ref 98–110)
Creat: 0.7 mg/dL (ref 0.60–0.93)
Globulin: 2.4 g/dL (calc) (ref 1.9–3.7)
Glucose, Bld: 92 mg/dL (ref 65–99)
Potassium: 4 mmol/L (ref 3.5–5.3)
Sodium: 141 mmol/L (ref 135–146)
Total Bilirubin: 0.7 mg/dL (ref 0.2–1.2)
Total Protein: 6.7 g/dL (ref 6.1–8.1)

## 2020-02-11 NOTE — Progress Notes (Signed)
Left message for pt to call bk 

## 2020-04-26 ENCOUNTER — Ambulatory Visit (INDEPENDENT_AMBULATORY_CARE_PROVIDER_SITE_OTHER): Payer: BC Managed Care – PPO | Admitting: Family Medicine

## 2020-04-26 ENCOUNTER — Other Ambulatory Visit: Payer: Self-pay

## 2020-04-26 ENCOUNTER — Encounter: Payer: Self-pay | Admitting: Family Medicine

## 2020-04-26 VITALS — BP 124/76 | HR 74 | Temp 97.2°F | Ht 68.0 in | Wt 169.0 lb

## 2020-04-26 DIAGNOSIS — M25551 Pain in right hip: Secondary | ICD-10-CM

## 2020-04-26 DIAGNOSIS — W19XXXD Unspecified fall, subsequent encounter: Secondary | ICD-10-CM

## 2020-04-26 DIAGNOSIS — M5441 Lumbago with sciatica, right side: Secondary | ICD-10-CM | POA: Diagnosis not present

## 2020-04-26 DIAGNOSIS — Z9181 History of falling: Secondary | ICD-10-CM

## 2020-04-26 DIAGNOSIS — G8929 Other chronic pain: Secondary | ICD-10-CM | POA: Diagnosis not present

## 2020-04-26 MED ORDER — ALBUTEROL SULFATE (2.5 MG/3ML) 0.083% IN NEBU: 2.5000 mg | INHALATION_SOLUTION | Freq: Four times a day (QID) | RESPIRATORY_TRACT | 6 refills | Status: DC | PRN

## 2020-04-26 MED ORDER — VITAMIN D 125 MCG (5000 UT) PO CAPS: ORAL_CAPSULE | ORAL | 0 refills | Status: AC

## 2020-04-26 MED ORDER — BUDESONIDE 1 MG/2ML IN SUSP
1.0000 mg | Freq: Every day | RESPIRATORY_TRACT | 3 refills | Status: DC
Start: 2020-04-26 — End: 2022-10-21

## 2020-04-26 MED ORDER — ALENDRONATE SODIUM 70 MG PO TABS
ORAL_TABLET | ORAL | 1 refills | Status: DC
Start: 2020-04-26 — End: 2022-02-11

## 2020-04-26 MED ORDER — FENOFIBRATE 48 MG PO TABS
48.0000 mg | ORAL_TABLET | ORAL | 3 refills | Status: DC
Start: 2020-04-26 — End: 2021-11-12

## 2020-04-26 NOTE — Progress Notes (Signed)
   Subjective:    Patient ID: Jennifer Hays, female    DOB: 1946-02-14, 75 y.o.   MRN: [id number]  Patient presents for Right hip pain and falls (Seeing pain management )  Pt here due to recurrent falls Has appt on Monday with Dr. Brien Few Monday was on Commode and finished BM, she went to stand up and her legs buckled and she fell She has fall 3 times in the past 3 months She doent feel dizzy, had headache chest pain shortness of breath before she falls it does not like her legs give out on her. She had hip xray in December after fall there was no acute abnormality. 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 workers comp from [place]:  GEN- denies fatigue, fever, weight loss,weakness, recent illness HEENT- denies eye drainage, change in vision, nasal discharge, CVS- denies chest pain, palpitations RESP- denies SOB, cough, wheeze ABD- denies N/V, change in stools, abd pain GU- denies dysuria, hematuria, dribbling, incontinence MSK- + joint pain, muscle aches, injury Neuro- denies headache, dizziness, syncope, seizure activity       Objective:    BP 124/76   Pulse 74   Temp (!) 97.2 F (36.2 C)   Ht 5\' 8"  (1.727 m)   Wt 169 lb (76.7 kg)   SpO2 97%   BMI 25.70 kg/m  GEN- NAD, alert and oriented x3 HEENT- PERRL, EOMI, non injected sclera, pink conjunctiva, CVS- RRR, no murmur RESP-CTAB ABD-NABS,soft,NT,ND MSK FAIR ROM spine. mild TTP base of spine, mild spasm TTP over right hip and lateral thigh, + SLR right side   TTP right hip, pain with palpation and hip Neuro- decreased strength RLE compared to left, tone in tact, sensation grossly in tact  EXT- No edema Pulses- Radial, DP- 2+        Assessment & Plan:      Problem List Items Addressed This Visit   None   Visit Diagnoses    Fall, subsequent encounter    -  Primary   Recurrent  mechanical falls, no other symptoms, concern she has more lumbar pathology such as spinal stenosis, xray hips normal, no recent imaging of spine But has history of back surgery She has appt with  Spine specialist on Monday Will defer imaging to see how they want to proceed Pain meds per spine specialist    Chronic pain of right hip       Chronic right-sided low back pain with right-sided sciatica          Note: This dictation was prepared with Dragon dictation along with smaller phrase technology. Any transcriptional errors that result from this process are unintentional.

## 2020-04-26 NOTE — Patient Instructions (Signed)
Change to [facility]

## 2020-05-21 ENCOUNTER — Other Ambulatory Visit: Payer: Self-pay | Admitting: Family Medicine

## 2020-07-06 DIAGNOSIS — G894 Chronic pain syndrome: Secondary | ICD-10-CM | POA: Diagnosis not present

## 2020-07-06 DIAGNOSIS — M810 Age-related osteoporosis without current pathological fracture: Secondary | ICD-10-CM | POA: Diagnosis not present

## 2020-07-06 DIAGNOSIS — E559 Vitamin D deficiency, unspecified: Secondary | ICD-10-CM | POA: Diagnosis not present

## 2020-07-06 DIAGNOSIS — J45909 Unspecified asthma, uncomplicated: Secondary | ICD-10-CM | POA: Diagnosis not present

## 2020-07-06 DIAGNOSIS — E785 Hyperlipidemia, unspecified: Secondary | ICD-10-CM | POA: Diagnosis not present

## 2020-07-06 DIAGNOSIS — L989 Disorder of the skin and subcutaneous tissue, unspecified: Secondary | ICD-10-CM | POA: Diagnosis not present

## 2020-07-06 DIAGNOSIS — M797 Fibromyalgia: Secondary | ICD-10-CM | POA: Diagnosis not present

## 2020-07-06 DIAGNOSIS — Z0189 Encounter for other specified special examinations: Secondary | ICD-10-CM | POA: Diagnosis not present

## 2020-07-14 ENCOUNTER — Other Ambulatory Visit: Payer: Self-pay | Admitting: Family Medicine

## 2020-07-25 ENCOUNTER — Other Ambulatory Visit: Payer: Self-pay

## 2020-07-25 ENCOUNTER — Encounter: Payer: Self-pay | Admitting: Orthopaedic Surgery

## 2020-07-25 ENCOUNTER — Ambulatory Visit: Payer: Medicare Other

## 2020-07-25 ENCOUNTER — Ambulatory Visit (INDEPENDENT_AMBULATORY_CARE_PROVIDER_SITE_OTHER): Payer: BC Managed Care – PPO | Admitting: Orthopaedic Surgery

## 2020-07-25 VITALS — BP 124/84 | HR 67 | Ht 68.0 in | Wt 166.0 lb

## 2020-07-25 DIAGNOSIS — G894 Chronic pain syndrome: Secondary | ICD-10-CM

## 2020-07-25 DIAGNOSIS — Z9181 History of falling: Secondary | ICD-10-CM | POA: Diagnosis not present

## 2020-07-25 DIAGNOSIS — M25511 Pain in right shoulder: Secondary | ICD-10-CM

## 2020-07-25 DIAGNOSIS — G8929 Other chronic pain: Secondary | ICD-10-CM

## 2020-07-25 NOTE — Progress Notes (Signed)
Subjective:    Patient ID: Jennifer Hays, female    DOB: 1945/05/28, 75 y.o.   MRN: [id number]  HPI She is here today for right shoulder pain. She has pain with overhead use and sometimes sleeping on it. 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  She has no recent trauma. She has no swelling, she has no numbness.  She has a trigger point in the right upper trapezius area and just below the neck in the midline area.  She is being treated by Dr. Orpah Melter for chronic pain and he gives her medicine and treatments.  She also relates multiple falls.  She has fallen on February 04, 2020, December 8, December 17, March 04, 2020, February 5 and also a fall on Christmas Day 2021.  She was evaluated at [facility] for the December 8th fall and had X-rays.  X-rays were negative for fracture.  I am concerned about the falls.  She says she has weakness of the right side that Dr. Orpah Melter is taking care of. 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 She has not had a fall since February 5.  However, she uses a cane to hold her upright if she does feel she may fall.  I have asked for the notes from Dr. Orpah Melter.  She has not seen a neurologist.  She does not feel it is related to any head or inner ear problem.  She cannot take NSAIDs.  She takes Tylenol.   Review of Systems  Constitutional: Positive for activity change.  Respiratory: Positive for shortness of breath.   Musculoskeletal: Positive for arthralgias, gait problem, myalgias and neck pain.  Allergic/Immunologic: Positive for environmental allergies.  All other systems reviewed and are negative.  For Review of Systems, all other systems reviewed and are negative.  The following is a summary of the past history medically, past history surgically, known current medicines, social history and family history.  This information is gathered electronically by the computer from prior information and documentation.  I  review this each visit and have found including this information at this point in the chart is beneficial and informative.   Past Medical History:  Diagnosis Date  . Allergy   . Asthma   . Atrophic vaginitis   . Osteopenia     Past Surgical History:  Procedure Laterality Date  . ABDOMINAL HYSTERECTOMY    . KNEE SURGERY     Right  . SPINE SURGERY      Current Outpatient Medications on File Prior to Visit  Medication Sig Dispense Refill  . albuterol (PROVENTIL) (2.5 MG/3ML) 0.083% nebulizer solution Take 3 mLs (2.5 mg total) by nebulization every 6 (six) hours as needed for wheezing. 150 mL 6  . alendronate (FOSAMAX) 70 MG tablet Take with a full glass of water on an empty stomach. 12 tablet 1  . Ascorbic Acid (VITAMIN C) 1000 MG tablet Take 500 mg by mouth daily.     . budesonide (PULMICORT) 1 MG/2ML nebulizer solution Take 2 mLs (1 mg total) by nebulization daily. 120 mL 3  . Cholecalciferol (VITAMIN D) 125 MCG (5000 UT) CAPS Take 1 capsule daily 30 capsule 0  . fenofibrate (TRICOR) 48 MG tablet Take 1 tablet (48 mg total) by mouth 3 (three) times a week. 90 tablet 3  . HYDROcodone-acetaminophen (NORCO/VICODIN) 5-325 MG per tablet Take 1 tablet by mouth every 6 (six) hours as needed for moderate  pain.    . loratadine (CLARITIN) 10 MG tablet TAKE 1 TABLET BY MOUTH EVERY DAY 30 tablet 0  . Meth-Hyo-M Bl-Na Phos-Ph Sal (URO-MP) 118 MG CAPS TAKE 2 CAPSULES TWICE A DAY AS NEEDED CYSTITIS 60 capsule 1  . Multiple Vitamin (MULTIVITAMIN) tablet Take 1 tablet by mouth daily.    . sodium chloride (OCEAN) 0.65 % SOLN nasal spray Place 1 spray into both nostrils as needed for congestion.    . tizanidine (ZANAFLEX) 2 MG capsule Take 2 mg by mouth 3 (three) times daily.    Grant Ruts INHUB 250-50 MCG/ACT AEPB INHALE 1 PUFF INTO THE LUNGS TWICE A DAY 60 each 3  . lidocaine (LINDAMANTLE) 3 % CREA cream SMARTSIG:Sparingly Topical Daily     No current facility-administered medications on file prior to  visit.    Social History   Socioeconomic History  . Marital status: Married    Spouse name: Not on file  . Number of children: Not on file  . Years of education: Not on file  . Highest education level: Not on file  Occupational History  . Not on file  Tobacco Use  . Smoking status: Former Research scientist (life sciences)  . Smokeless tobacco: Never Used  . Tobacco comment: quit 1970's  Vaping Use  . Vaping Use: Never used  Substance and Sexual Activity  . Alcohol use: No    Alcohol/week: 0.0 standard drinks  . Drug use: No  . Sexual activity: Yes  Other Topics Concern  . Not on file  Social History Narrative  . Not on file   Social Determinants of Health   Financial Resource Strain: Not on file  Food Insecurity: Not on file  Transportation Needs: Not on file  Physical Activity: Not on file  Stress: Not on file  Social Connections: Not on file  Intimate Partner Violence: Not on file    History reviewed. No pertinent family history.  BP 124/84   Pulse 67   Ht 5\' 8"  (1.727 m)   Wt 166 lb (75.3 kg)   BMI 25.24 kg/m   Body mass index is 25.24 kg/m.      Objective:   Physical Exam Vitals and nursing note reviewed. Exam conducted with a chaperone present.  Constitutional:      Appearance: She is well-developed.  HENT:     Head: Normocephalic and atraumatic.  Eyes:     Conjunctiva/sclera: Conjunctivae normal.     Pupils: Pupils are equal, round, and reactive to light.  Cardiovascular:     Rate and Rhythm: Normal rate and regular rhythm.  Pulmonary:     Effort: Pulmonary effort is normal.  Abdominal:     Palpations: Abdomen is soft.  Musculoskeletal:       Arms:     Cervical back: Normal range of motion and neck supple.  Skin:    General: Skin is warm and dry.  Neurological:     Mental Status: She is alert and oriented to person, place, and time.     Cranial Nerves: No cranial nerve deficit.     Motor: No abnormal muscle tone.     Coordination: Coordination normal.      Deep Tendon Reflexes: Reflexes are normal and symmetric. Reflexes normal.  Psychiatric:        Behavior: Behavior normal.        Thought Content: Thought content normal.        Judgment: Judgment normal.   X-rays were done of the right shoulder, reported separately.  Assessment & Plan:   Encounter Diagnoses  Name Primary?  . Chronic right shoulder pain Yes  . Personal history of fall   . Chronic pain syndrome    I am concerned about the falls.  I have suggested she talk to Dr. Orpah Melter about this.  She can get her records from him if she desires.  I will begin OT for the shoulder and neck.  Return in three weeks.   Electronically Signed Sanjuana Kava, MD 5/24/202212:32 PM

## 2020-07-26 ENCOUNTER — Ambulatory Visit (HOSPITAL_COMMUNITY): Payer: BC Managed Care – PPO | Admitting: Occupational Therapy

## 2020-08-22 ENCOUNTER — Ambulatory Visit: Payer: Medicare Other | Admitting: Orthopaedic Surgery

## 2020-09-13 ENCOUNTER — Other Ambulatory Visit: Payer: Self-pay

## 2020-09-13 ENCOUNTER — Encounter (HOSPITAL_COMMUNITY): Payer: Self-pay

## 2020-09-13 ENCOUNTER — Ambulatory Visit (HOSPITAL_COMMUNITY): Payer: BC Managed Care – PPO | Attending: Orthopaedic Surgery

## 2020-09-13 DIAGNOSIS — M25511 Pain in right shoulder: Secondary | ICD-10-CM | POA: Diagnosis not present

## 2020-09-13 DIAGNOSIS — M25611 Stiffness of right shoulder, not elsewhere classified: Secondary | ICD-10-CM | POA: Diagnosis not present

## 2020-09-13 DIAGNOSIS — R29898 Other symptoms and signs involving the musculoskeletal system: Secondary | ICD-10-CM | POA: Diagnosis not present

## 2020-09-13 DIAGNOSIS — G8929 Other chronic pain: Secondary | ICD-10-CM | POA: Insufficient documentation

## 2020-09-13 NOTE — Patient Instructions (Signed)
Complete the following exercises 2-3 times a day.  Doorway Stretch  Place each hand opposite each other on the doorway. (You can change where you feel the stretch by moving arms higher or lower.) Step through with one foot and bend front knee until a stretch is felt and hold. Step through with the opposite foot on the next rep. Hold for __20-30__ seconds. Repeat __2__times.     Scapular Retraction (Standing)   With arms at sides, pinch shoulder blades together. Repeat __10__ times per set. Do __1__ sets per session. Do __2__ sessions per day.  http://orth.exer.us/944   Copyright  VHI. All rights reserved.      Posterior Capsule Stretch   Stand or sit, one arm across body so hand rests over opposite shoulder. Gently push on crossed elbow with other hand until stretch is felt in shoulder of crossed arm. Hold _20-30_ seconds.  Repeat _2__ times per session. Do ___ sessions per day.   Wall Flexion  Slide your arm up the wall or door frame until a stretch is felt in your shoulder . Hold for 20-30 seconds. Complete 2 times     Shoulder Abduction Stretch  Stand side ways by a wall with affected up on wall. Gently step in toward wall to feel stretch. Hold for 20-30 seconds. Complete 2 times.

## 2020-09-14 NOTE — Therapy (Signed)
[facility] [place], [place], [address] Phone: [phone]   Fax:  [phone]  Occupational Therapy Evaluation  Patient Details  Name: Jennifer Hays MRN: [id number] Date of Birth: 04-18-1945 Referring Provider (OT): Sanjuana Kava, MD   Encounter Date: 09/13/2020   OT End of Session - 09/13/20 1733     Visit Number 1    Number of Visits 8    Date for OT Re-Evaluation 10/25/20    Authorization Type 1) medicare 2) BCBS    Authorization Time Period no authorization needed. No visit limit    OT Start Time 1639    OT Stop Time 1737    OT Time Calculation (min) 58 min    Activity Tolerance Patient tolerated treatment well    Behavior During Therapy WFL for tasks assessed/performed             Past Medical History:  Diagnosis Date   Allergy    Asthma    Atrophic vaginitis    Osteopenia     Past Surgical History:  Procedure Laterality Date   ABDOMINAL HYSTERECTOMY     KNEE SURGERY     Right   SPINE SURGERY      There were no vitals filed for this visit.   Subjective Assessment - 09/13/20 1716     Subjective  I fall a lot and I usually end up on this right shoulder.    Pertinent History Patient is a 75 y/o female S/P right shoulder pain which began approximately january 2022 from frequent falls and landing on her right shoulder/arm. Patient reports that she has osteoporosis and fibromyalgia as well. She is interested a PT referral to address her frequent falls as well. OT will send an order to Dr. Luna Glasgow and follow up with patient when it is signed. Dr. Luna Glasgow has referred patient to occupational therapy for evaluation and treatment.    Patient Stated Goals To decrease her right arm pain    Currently in Pain? Yes    Pain Score 4    7-8/10 when painting or using computer.   Pain Location Shoulder    Pain Orientation Right    Pain Descriptors / Indicators Aching;Constant;Burning    Pain Type Chronic pain     Pain Radiating Towards shoulder down to wrist    Pain Onset More than a month ago    Pain Frequency Constant    Aggravating Factors  increased use, high level reaching,    Pain Relieving Factors pain medication, heating pad, sometimes ice,    Effect of Pain on Daily Activities severe effect    Multiple Pain Sites No               [facility] OT Assessment - 09/13/20 1652       Assessment   Medical Diagnosis Chronic Right shoulder pain    Referring Provider (OT) Sanjuana Kava, MD    Onset Date/Surgical Date --   Feb. 2022   Hand Dominance Right    Next MD Visit 09/26/20    Prior Therapy Pt received OT services at this clinic for her Left shoulder in 2019.      Precautions   Precautions Fall      Restrictions   Weight Bearing Restrictions No      Balance Screen   Has the patient fallen in the past 6 months Yes    How many times? 5-6    Has the patient had a decrease in activity  level because of a fear of falling?  No    Is the patient reluctant to leave their home because of a fear of falling?  No      Home  Environment   Family/patient expects to be discharged to: Private residence      Prior Function   Level of [place] for independence    Vocation Retired    Leisure Likes to Designer, fashion/clothing, uses computer      ADL   ADL comments When on the computer and painting patient experiences pain (15-20 minutes onset), difficulty with ROM; such as scratching her back, difficulty with strength such opening heavy.      Mobility   Mobility Status History of falls      Written Expression   Dominant Hand Right      Vision - History   Baseline Vision Wears glasses all the time      Cognition   Overall Cognitive Status Within Functional Limits for tasks assessed      Observation/Other Assessments   Focus on Therapeutic Outcomes (FOTO)  54/100      Posture/Postural Control   Posture/Postural Control Postural limitations    Postural Limitations  Rounded Shoulders;Forward head      ROM / Strength   AROM / PROM / Strength AROM;PROM;Strength      Palpation   Palpation comment Max fascial restrictions palpated in right upper arm, upper trapezius, and scapularis region.      AROM   Overall AROM Comments Shoulder was abducted for IR/er. seated.    AROM Assessment Site Shoulder    Right/Left Shoulder Right    Right Shoulder Flexion 140 Degrees    Right Shoulder ABduction 132 Degrees    Right Shoulder Internal Rotation 30 Degrees    Right Shoulder External Rotation 55 Degrees      PROM   Overall PROM Comments Assessed supine. IR/er abducted    PROM Assessment Site Shoulder    Right/Left Shoulder Right    Right Shoulder Flexion 180 Degrees    Right Shoulder ABduction 152 Degrees    Right Shoulder Internal Rotation 80 Degrees    Right Shoulder External Rotation 90 Degrees      Strength   Overall Strength Comments Assessed seated during A/ROM measurements. IR/er adducted    Strength Assessment Site Shoulder    Right/Left Shoulder Right    Right Shoulder Flexion 3/5    Right Shoulder ABduction 3/5    Right Shoulder Internal Rotation 3/5    Right Shoulder External Rotation 3/5                             OT Education - 09/14/20 1511     Education Details shoulder stretches    Person(s) Educated Patient    Methods Explanation;Demonstration;Verbal cues;Handout    Comprehension Returned demonstration;Verbalized understanding              OT Short Term Goals - 09/14/20 1519       OT SHORT TERM GOAL #1   Title Patient will be educated on HEP to faciliate her progress in therapy and increase her ability to use her RUE for all desired tasks with increased comfort and ease.    Time 3    Period Weeks    Status New    Target Date 10/05/20               OT Long Term Goals -  09/14/20 1520       OT LONG TERM GOAL #1   Title Patient will increase her RUE A/ROM to WNL in order to complete  required reaching tasks needed with less difficulty.    Time 6    Period Weeks    Status New    Target Date 10/25/20      OT LONG TERM GOAL #2   Title Patient will report a decrease in pain in her right shoulder to 4/10 or less when completing leisure tasks such a painting or during computer use.    Time 6    Period Weeks    Status New      OT LONG TERM GOAL #3   Title Patient will increase her right UE strength to 4/5 or better in order to  complete required daily tasks with less difficulty.    Time 6    Period Weeks    Status New      OT LONG TERM GOAL #4   Title Patient will decrease her RUE fascial restrictions to min amount or less in order to increase the functional mobility that is needed during activities such as dressing, bathing, etc.    Time 6    Period Weeks    Status New                   Plan - 09/14/20 1516     Clinical Impression Statement A: Patient is a 75 y/o female S/P right shoulder pain which has occured due to increased falls to the right side. Patient is demonstrating decreased ROM, strength and increased fascial restrictions resulting in difficulty completing leisure, self care, and sleep tasks.    OT Occupational Profile and History Problem Focused Assessment - Including review of records relating to presenting problem    Occupational performance deficits (Please refer to evaluation for details): ADL's;IADL's;Rest and Sleep;Leisure    Body Structure / Function / Physical Skills ADL;UE functional use;Fascial restriction;Pain;ROM;IADL;Strength    Rehab Potential Excellent    Clinical Decision Making Limited treatment options, no task modification necessary    Comorbidities Affecting Occupational Performance: Presence of comorbidities impacting occupational performance    Comorbidities impacting occupational performance description: see medical chart    Modification or Assistance to Complete Evaluation  No modification of tasks or assist necessary  to complete eval    OT Frequency 2x / week    OT Duration 8 weeks    OT Treatment/Interventions Self-care/ADL training;Ultrasound;Patient/family education;DME and/or AE instruction;Passive range of motion;Cryotherapy;Electrical Stimulation;Moist Heat;Neuromuscular education;Therapeutic exercise;Manual Therapy;Therapeutic activities    Plan P: Patient will  benefit from skilled OT services to increase the functional use of her RUE with decreased pain while completing daily tasks. Treatment plan: Myofascial release, passive stretching, A/ROM, general shoulder and scapular strengthening. Modalities PRN.    OT Home Exercise Plan eval: shoulder stretches    Consulted and Agree with Plan of Care Patient             Patient will benefit from skilled therapeutic intervention in order to improve the following deficits and impairments:   Body Structure / Function / Physical Skills: ADL, UE functional use, Fascial restriction, Pain, ROM, IADL, Strength       Visit Diagnosis: Other symptoms and signs involving the musculoskeletal system - Plan: Ot plan of care cert/re-cert  Chronic right shoulder pain - Plan: Ot plan of care cert/re-cert  Stiffness of right shoulder, not elsewhere classified - Plan: Ot plan of care cert/re-cert  Problem List Patient Active Problem List   Diagnosis Date Noted   Vitamin D deficiency 08/05/2018   IC (interstitial cystitis) 02/17/2018   Osteoporosis 05/12/2013   UTI (urinary tract infection) 03/28/2013   Hyperlipidemia 01/19/2013   Colon cancer screening 01/19/2013   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 [place], [place], [address] Phone: [phone]   Fax:  [phone]  Name: JAZZMIN NEWBOLD MRN: [id number] Date of Birth: November 01, 1945

## 2020-09-15 ENCOUNTER — Other Ambulatory Visit: Payer: Self-pay

## 2020-09-15 ENCOUNTER — Ambulatory Visit (HOSPITAL_COMMUNITY): Payer: BC Managed Care – PPO

## 2020-09-15 ENCOUNTER — Encounter (HOSPITAL_COMMUNITY): Payer: Self-pay

## 2020-09-15 DIAGNOSIS — M25511 Pain in right shoulder: Secondary | ICD-10-CM | POA: Diagnosis not present

## 2020-09-15 DIAGNOSIS — M25611 Stiffness of right shoulder, not elsewhere classified: Secondary | ICD-10-CM | POA: Diagnosis not present

## 2020-09-15 DIAGNOSIS — R29898 Other symptoms and signs involving the musculoskeletal system: Secondary | ICD-10-CM

## 2020-09-15 DIAGNOSIS — G8929 Other chronic pain: Secondary | ICD-10-CM

## 2020-09-15 NOTE — Therapy (Signed)
[place], [place], [address] Phone: [phone]   Fax:  [phone]  Occupational Therapy Treatment  Patient Details  Name: Jennifer Hays MRN: [id number] Date of Birth: 1945/04/27 Referring Provider (OT): Sanjuana Kava, MD   Encounter Date: 09/15/2020   OT End of Session - 09/15/20 1029     Visit Number 2    Number of Visits 8    Date for OT Re-Evaluation 10/25/20    Authorization Type 1) medicare 2) BCBS    Authorization Time Period no authorization needed. No visit limit    OT Start Time 1030    OT Stop Time 1109    OT Time Calculation (min) 39 min    Activity Tolerance Patient tolerated treatment well    Behavior During Therapy WFL for tasks assessed/performed             Past Medical History:  Diagnosis Date   Allergy    Asthma    Atrophic vaginitis    Osteopenia     Past Surgical History:  Procedure Laterality Date   ABDOMINAL HYSTERECTOMY     KNEE SURGERY     Right   SPINE SURGERY      There were no vitals filed for this visit.   Subjective Assessment - 09/15/20 1033     Currently in Pain? Yes    Pain Score 7     Pain Location Shoulder    Pain Orientation Right    Pain Descriptors / Indicators Aching;Constant    Pain Type Chronic pain    Pain Onset More than a month ago    Aggravating Factors  increased use, high level reaching    Pain Relieving Factors pain medication, heating pad, sometimes ice    Effect of Pain on Daily Activities severe effect    Multiple Pain Sites No                [facility] OT Assessment - 09/15/20 1034       Assessment   Medical Diagnosis Chronic Right shoulder pain      Precautions   Precautions Fall                      OT Treatments/Exercises (OP) - 09/15/20 1034       Exercises   Exercises Shoulder      Shoulder Exercises: Supine   Protraction PROM;5 reps;AROM;10 reps    Horizontal ABduction PROM;5 reps;AROM;10 reps     External Rotation PROM;5 reps;AROM;10 reps    Internal Rotation PROM;5 reps;AROM;10 reps    Flexion PROM;5 reps;AROM;10 reps    ABduction PROM   1X     Shoulder Exercises: Seated   Row AROM;Theraband;10 reps    Theraband Level (Shoulder Row) Level 2 (Red)    Protraction AROM;10 reps    Horizontal ABduction AROM;10 reps    External Rotation AROM;10 reps    Internal Rotation AROM;10 reps    Flexion AROM;10 reps    Abduction AROM;10 reps      Manual Therapy   Manual Therapy Myofascial release    Manual therapy comments Manual therapy completed prior to exercises.    Myofascial Release Myofascial release completed to the right upper arm, upper trapezius, and scapularis region to decrease fascial restrictions and increase joint mobility in a pain free zone.                      OT Short Term  Goals - 09/15/20 1030       OT SHORT TERM GOAL #1   Title Patient will be educated on HEP to faciliate her progress in therapy and increase her ability to use her RUE for all desired tasks with increased comfort and ease.    Time 3    Period Weeks    Status On-going    Target Date 10/05/20               OT Long Term Goals - 09/15/20 1030       OT LONG TERM GOAL #1   Title Patient will increase her RUE A/ROM to WNL in order to complete required reaching tasks needed with less difficulty.    Time 6    Period Weeks    Status On-going      OT LONG TERM GOAL #2   Title Patient will report a decrease in pain in her right shoulder to 4/10 or less when completing leisure tasks such a painting or during computer use.    Time 6    Period Weeks    Status On-going      OT LONG TERM GOAL #3   Title Patient will increase her right UE strength to 4/5 or better in order to  complete required daily tasks with less difficulty.    Time 6    Period Weeks    Status On-going      OT LONG TERM GOAL #4   Title Patient will decrease her RUE fascial restrictions to min amount or less  in order to increase the functional mobility that is needed during activities such as dressing, bathing, etc.    Time 6    Period Weeks    Status On-going                   Plan - 09/15/20 1150     Clinical Impression Statement A: Initiated myofascial release to address max fascial restrictions in the left upper arm and upper trapezius region. Pt required very light pressure during technique due to pain level with any pressure. Good response noted after manual techniques with less restrictions. Completed supine and seated A/ROM with VC for form and technique. Seated exercises completed due to high risk of falls. Rest breaks were encouraged as needed due to pain and muscle fatigue.    Body Structure / Function / Physical Skills ADL;UE functional use;Fascial restriction;Pain;ROM;IADL;Strength    Plan P: Continue with seated scapular strengthening. Add extension and provided row and extension to HEP if appropriate.    Consulted and Agree with Plan of Care Patient             Patient will benefit from skilled therapeutic intervention in order to improve the following deficits and impairments:   Body Structure / Function / Physical Skills: ADL, UE functional use, Fascial restriction, Pain, ROM, IADL, Strength       Visit Diagnosis: Chronic right shoulder pain  Stiffness of right shoulder, not elsewhere classified  Other symptoms and signs involving the musculoskeletal system    Problem List Patient Active Problem List   Diagnosis Date Noted   Vitamin D deficiency 08/05/2018   IC (interstitial cystitis) 02/17/2018   Osteoporosis 05/12/2013   UTI (urinary tract infection) 03/28/2013   Hyperlipidemia 01/19/2013   Colon cancer screening 01/19/2013   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 [place], [place], [address] Phone: [phone]   Fax:  [phone]  Name: Jennifer Hays MRN: [id number] Date of Birth: Apr 03, 1945

## 2020-09-19 ENCOUNTER — Other Ambulatory Visit: Payer: Self-pay

## 2020-09-19 ENCOUNTER — Ambulatory Visit (HOSPITAL_COMMUNITY): Payer: BC Managed Care – PPO

## 2020-09-19 ENCOUNTER — Encounter (HOSPITAL_COMMUNITY): Payer: Self-pay

## 2020-09-19 DIAGNOSIS — M25611 Stiffness of right shoulder, not elsewhere classified: Secondary | ICD-10-CM

## 2020-09-19 DIAGNOSIS — R29898 Other symptoms and signs involving the musculoskeletal system: Secondary | ICD-10-CM | POA: Diagnosis not present

## 2020-09-19 DIAGNOSIS — G8929 Other chronic pain: Secondary | ICD-10-CM | POA: Diagnosis not present

## 2020-09-19 DIAGNOSIS — M25511 Pain in right shoulder: Secondary | ICD-10-CM | POA: Diagnosis not present

## 2020-09-19 NOTE — Patient Instructions (Signed)
Repeat all exercises 10-15 times, 1-2 times per day.  1) Shoulder Protraction    Begin with elbows by your side, slowly "punch" straight out in front of you.      2) Shoulder Flexion Standing:         Begin with arms at your side with thumbs pointed up, slowly raise both arms up and forward towards overhead.      3) Horizontal abduction/adduction   Standing:           Begin with arms straight out in front of you, bring out to the side in at "T" shape. Keep arms straight entire time.        4) Internal & External Rotation   Standing:     Stand with elbows at the side and elbows bent 90 degrees. Move your forearms away from your body, then bring back inward toward the body.     5) Shoulder Abduction Standing:       begin with your arms flat  next to your side. Slowly move your arms out to the side so that they go overhead, in a jumping jack or snow angel movement.

## 2020-09-19 NOTE — Therapy (Signed)
[facility], [place], [address] Phone: [phone]   Fax:  [phone]  Occupational Therapy Treatment  Patient Details  Name: Jennifer Hays MRN: [id number] Date of Birth: [birth year] Referring Provider (OT): Sanjuana Kava, MD   Encounter Date: 09/19/2020   OT End of Session - 09/19/20 1353     Visit Number 3    Number of Visits 8    Date for OT Re-Evaluation 10/25/20    Authorization Type 1) medicare 2) BCBS    Authorization Time Period no authorization needed. No visit limit    OT Start Time 1115    OT Stop Time 1155    OT Time Calculation (min) 40 min    Activity Tolerance Patient tolerated treatment well    Behavior During Therapy WFL for tasks assessed/performed             Past Medical History:  Diagnosis Date   Allergy    Asthma    Atrophic vaginitis    Osteopenia     Past Surgical History:  Procedure Laterality Date   ABDOMINAL HYSTERECTOMY     KNEE SURGERY     Right   SPINE SURGERY      There were no vitals filed for this visit.   Subjective Assessment - 09/19/20 1122     Subjective  S: My shoulder does feel better.    Currently in Pain? Yes    Pain Location Shoulder    Pain Orientation Right    Pain Descriptors / Indicators Aching;Constant    Pain Type Chronic pain    Pain Onset More than a month ago    Aggravating Factors  increased use, high level reaching    Pain Relieving Factors heating pad    Effect of Pain on Daily Activities max effect                [facility] OT Assessment - 09/19/20 1352       Assessment   Medical Diagnosis Chronic Right shoulder pain      Precautions   Precautions Fall                      OT Treatments/Exercises (OP) - 09/19/20 1141       Exercises   Exercises Shoulder      Shoulder Exercises: Supine   Protraction PROM;5 reps;AROM;10 reps    Horizontal ABduction PROM;5 reps;AROM;10 reps    External Rotation PROM;5  reps;AROM;10 reps    Internal Rotation PROM;5 reps;AROM;10 reps    Flexion PROM;5 reps;AROM;10 reps    ABduction AROM;10 reps      Shoulder Exercises: Seated   Extension Theraband;10 reps    Theraband Level (Shoulder Extension) Level 2 (Red)    Retraction Theraband;10 reps    Theraband Level (Shoulder Retraction) Level 2 (Red)    Row Theraband;10 reps    Theraband Level (Shoulder Row) Level 2 (Red)    Protraction AROM;10 reps    Horizontal ABduction AROM;10 reps    External Rotation AROM;10 reps    Internal Rotation AROM;10 reps    Flexion AROM;10 reps    Abduction AROM;10 reps      Manual Therapy   Manual Therapy Myofascial release    Manual therapy comments Manual therapy completed prior to exercises.    Myofascial Release Myofascial release completed to the right upper arm, upper trapezius, and scapularis region to decrease fascial restrictions and increase joint mobility in a pain free zone.  OT Education - 09/19/20 1352     Education Details A/ROM shoulder exercises - seated    Person(s) Educated Patient    Methods Explanation;Demonstration;Verbal cues;Handout    Comprehension Returned demonstration;Verbalized understanding              OT Short Term Goals - 09/15/20 1030       OT SHORT TERM GOAL #1   Title Patient will be educated on HEP to faciliate her progress in therapy and increase her ability to use her RUE for all desired tasks with increased comfort and ease.    Time 3    Period Weeks    Status On-going    Target Date 10/05/20               OT Long Term Goals - 09/15/20 1030       OT LONG TERM GOAL #1   Title Patient will increase her RUE A/ROM to WNL in order to complete required reaching tasks needed with less difficulty.    Time 6    Period Weeks    Status On-going      OT LONG TERM GOAL #2   Title Patient will report a decrease in pain in her right shoulder to 4/10 or less when completing leisure tasks  such a painting or during computer use.    Time 6    Period Weeks    Status On-going      OT LONG TERM GOAL #3   Title Patient will increase her right UE strength to 4/5 or better in order to  complete required daily tasks with less difficulty.    Time 6    Period Weeks    Status On-going      OT LONG TERM GOAL #4   Title Patient will decrease her RUE fascial restrictions to min amount or less in order to increase the functional mobility that is needed during activities such as dressing, bathing, etc.    Time 6    Period Weeks    Status On-going                   Plan - 09/19/20 1353     Clinical Impression Statement A: Manual techniques completed to address min fascial restrictions noted in right upper arm, upper trapezius, and scapularis region. Significant improvement with amount of fascial restrictions noted this date. Continued with A/ROM supine and seated. Added extension and retraction using red theraband. Required a significant amount of verbal and physical cueing for proper form and technique. Did not provide scapular exercises for HEP and instead provided A/ROM.    Body Structure / Function / Physical Skills ADL;UE functional use;Fascial restriction;Pain;ROM;IADL;Strength    Plan P Continue with seated scapular strengthening. Add UBE bike. overhead lacing for shoulder endurance.    OT Home Exercise Plan eval: shoulder stretches 7/19: A/ROM shoulder    Consulted and Agree with Plan of Care Patient             Patient will benefit from skilled therapeutic intervention in order to improve the following deficits and impairments:   Body Structure / Function / Physical Skills: ADL, UE functional use, Fascial restriction, Pain, ROM, IADL, Strength       Visit Diagnosis: Stiffness of right shoulder, not elsewhere classified  Other symptoms and signs involving the musculoskeletal system  Chronic right shoulder pain    Problem List Patient Active Problem List    Diagnosis Date Noted   Vitamin D deficiency 08/05/2018  IC (interstitial cystitis) 02/17/2018   Osteoporosis 05/12/2013   UTI (urinary tract infection) 03/28/2013   Hyperlipidemia 01/19/2013   Colon cancer screening 01/19/2013   Intrinsic asthma 08/10/2012   ALLERGIC RHINITIS 09/10/2007   GERD 09/10/2007   Fibromyalgia 09/10/2007    Ailene Ravel, OTR/L,CBIS  [phone]   09/19/2020, 2:01 PM  [facility] [address] [place], [place], [address] Phone: [phone]   Fax:  [phone]  Name: Jennifer Hays MRN: [id number] Date of Birth: [birth year]

## 2020-09-22 ENCOUNTER — Ambulatory Visit (HOSPITAL_COMMUNITY): Payer: BC Managed Care – PPO

## 2020-09-22 ENCOUNTER — Other Ambulatory Visit: Payer: Self-pay

## 2020-09-22 ENCOUNTER — Encounter (HOSPITAL_COMMUNITY): Payer: Self-pay

## 2020-09-22 DIAGNOSIS — J45909 Unspecified asthma, uncomplicated: Secondary | ICD-10-CM | POA: Diagnosis not present

## 2020-09-22 DIAGNOSIS — M797 Fibromyalgia: Secondary | ICD-10-CM | POA: Diagnosis not present

## 2020-09-22 DIAGNOSIS — M25511 Pain in right shoulder: Secondary | ICD-10-CM | POA: Diagnosis not present

## 2020-09-22 DIAGNOSIS — G894 Chronic pain syndrome: Secondary | ICD-10-CM | POA: Diagnosis not present

## 2020-09-22 DIAGNOSIS — G8929 Other chronic pain: Secondary | ICD-10-CM

## 2020-09-22 DIAGNOSIS — R29898 Other symptoms and signs involving the musculoskeletal system: Secondary | ICD-10-CM | POA: Diagnosis not present

## 2020-09-22 DIAGNOSIS — E785 Hyperlipidemia, unspecified: Secondary | ICD-10-CM | POA: Diagnosis not present

## 2020-09-22 DIAGNOSIS — M810 Age-related osteoporosis without current pathological fracture: Secondary | ICD-10-CM | POA: Diagnosis not present

## 2020-09-22 DIAGNOSIS — E559 Vitamin D deficiency, unspecified: Secondary | ICD-10-CM | POA: Diagnosis not present

## 2020-09-22 DIAGNOSIS — M25611 Stiffness of right shoulder, not elsewhere classified: Secondary | ICD-10-CM | POA: Diagnosis not present

## 2020-09-22 DIAGNOSIS — L989 Disorder of the skin and subcutaneous tissue, unspecified: Secondary | ICD-10-CM | POA: Diagnosis not present

## 2020-09-22 DIAGNOSIS — Z0001 Encounter for general adult medical examination with abnormal findings: Secondary | ICD-10-CM | POA: Diagnosis not present

## 2020-09-22 NOTE — Therapy (Signed)
[place], [place], [address] Phone: [phone]   Fax:  [phone]  Occupational Therapy Treatment  Patient Details  Name: Jennifer Hays MRN: [id number] Date of Birth: 08-Feb-1946 Referring Provider (OT): Sanjuana Kava, MD   Encounter Date: 09/22/2020   OT End of Session - 09/22/20 [id number]     Visit Number 4    Number of Visits 8    Date for OT Re-Evaluation 10/25/20    Authorization Type 1) medicare 2) BCBS    Authorization Time Period no authorization needed. No visit limit    OT Start Time OT End Time 1125  1200 35 minutes   Activity Tolerance Patient tolerated treatment well    Behavior During Therapy WFL for tasks assessed/performed             Past Medical History:  Diagnosis Date   Allergy    Asthma    Atrophic vaginitis    Osteopenia     Past Surgical History:  Procedure Laterality Date   ABDOMINAL HYSTERECTOMY     KNEE SURGERY     Right   SPINE SURGERY      There were no vitals filed for this visit.  Subjective: It's a little sore.    Subjective Assessment - 09/22/20 1130     Currently in Pain? Yes    Pain Score 5     Pain Location Shoulder    Pain Orientation Right    Pain Descriptors / Indicators Aching;Constant    Pain Type Chronic pain    Pain Onset More than a month ago    Pain Frequency Constant    Aggravating Factors  increased use, high level reaching    Pain Relieving Factors heating pad    Effect of Pain on Daily Activities max effect    Multiple Pain Sites No                [facility] OT Assessment - 09/22/20 1150       Assessment   Medical Diagnosis Chronic Right shoulder pain      Precautions   Precautions Fall                      OT Treatments/Exercises (OP) - 09/22/20 1142       Exercises   Exercises Shoulder      Shoulder Exercises: Supine   Protraction PROM;5 reps    Horizontal ABduction PROM;5 reps    External Rotation  PROM;5 reps    Internal Rotation PROM;5 reps    Flexion PROM;5 reps    ABduction 5 reps ; PROM     Shoulder Exercises: Seated   Protraction AROM;10 reps    Horizontal ABduction AROM;10 reps    External Rotation AROM;10 reps    Internal Rotation AROM;10 reps    Flexion AROM;10 reps    Abduction AROM;10 reps      Shoulder Exercises: ROM/Strengthening   UBE (Upper Arm Bike) Level 1 2' forward 2' reverse   pace: 3.0-3.5   Over Head Lace seated. Patient started at top of chain and laced to the bottom before removing.    Proximal Shoulder Strengthening, Supine 10X A/ROM                      OT Short Term Goals - 09/15/20 1030       OT SHORT TERM GOAL #1   Title Patient will be educated on HEP to faciliate  her progress in therapy and increase her ability to use her RUE for all desired tasks with increased comfort and ease.    Time 3    Period Weeks    Status On-going    Target Date 10/05/20               OT Long Term Goals - 09/15/20 1030       OT LONG TERM GOAL #1   Title Patient will increase her RUE A/ROM to WNL in order to complete required reaching tasks needed with less difficulty.    Time 6    Period Weeks    Status On-going      OT LONG TERM GOAL #2   Title Patient will report a decrease in pain in her right shoulder to 4/10 or less when completing leisure tasks such a painting or during computer use.    Time 6    Period Weeks    Status On-going      OT LONG TERM GOAL #3   Title Patient will increase her right UE strength to 4/5 or better in order to  complete required daily tasks with less difficulty.    Time 6    Period Weeks    Status On-going      OT LONG TERM GOAL #4   Title Patient will decrease her RUE fascial restrictions to min amount or less in order to increase the functional mobility that is needed during activities such as dressing, bathing, etc.    Time 6    Period Weeks    Status On-going                   Plan -  09/25/20 1652     Clinical Impression Statement A: Manual techniques completed to address fascial restirctions in the right upper arm, upper trapezius, and scapularis region. Added UBE bike and continued with scapular strengthening using red band to focus on shoulder stability and strength. VC for form and technique were provided.    Body Structure / Function / Physical Skills ADL;UE functional use;Fascial restriction;Pain;ROM;IADL;Strength    Plan P: Add proximal shoulder strengthening with washcloth on door or complete ball on the wall. Continue with scapular strengthening using band and provide to HEP when appropriate.    Consulted and Agree with Plan of Care Patient             Patient will benefit from skilled therapeutic intervention in order to improve the following deficits and impairments:   Body Structure / Function / Physical Skills: ADL, UE functional use, Fascial restriction, Pain, ROM, IADL, Strength       Visit Diagnosis: Other symptoms and signs involving the musculoskeletal system  Stiffness of right shoulder, not elsewhere classified  Chronic right shoulder pain    Problem List Patient Active Problem List   Diagnosis Date Noted   Vitamin D deficiency 08/05/2018   IC (interstitial cystitis) 02/17/2018   Osteoporosis 05/12/2013   UTI (urinary tract infection) 03/28/2013   Hyperlipidemia 01/19/2013   Colon cancer screening 01/19/2013   Intrinsic asthma 08/10/2012   ALLERGIC RHINITIS 09/10/2007   GERD 09/10/2007   Fibromyalgia 09/10/2007    Ailene Ravel, OTR/L,CBIS  [phone]  09/25/2020, 4:55 PM  [facility] [address] [place], [place], [address] Phone: [phone]   Fax:  [phone]  Name: Jennifer Hays MRN: [id number] Date of Birth: 1945/08/21

## 2020-09-26 ENCOUNTER — Other Ambulatory Visit: Payer: Self-pay

## 2020-09-26 ENCOUNTER — Encounter (HOSPITAL_COMMUNITY): Payer: Self-pay

## 2020-09-26 ENCOUNTER — Encounter: Payer: Self-pay | Admitting: Orthopaedic Surgery

## 2020-09-26 ENCOUNTER — Ambulatory Visit (INDEPENDENT_AMBULATORY_CARE_PROVIDER_SITE_OTHER): Payer: BC Managed Care – PPO | Admitting: Orthopaedic Surgery

## 2020-09-26 ENCOUNTER — Ambulatory Visit (HOSPITAL_COMMUNITY): Payer: BC Managed Care – PPO

## 2020-09-26 VITALS — BP 139/80 | HR 71 | Ht 68.5 in | Wt 165.4 lb

## 2020-09-26 DIAGNOSIS — M25611 Stiffness of right shoulder, not elsewhere classified: Secondary | ICD-10-CM | POA: Diagnosis not present

## 2020-09-26 DIAGNOSIS — M25511 Pain in right shoulder: Secondary | ICD-10-CM | POA: Diagnosis not present

## 2020-09-26 DIAGNOSIS — G8929 Other chronic pain: Secondary | ICD-10-CM | POA: Diagnosis not present

## 2020-09-26 DIAGNOSIS — Z9181 History of falling: Secondary | ICD-10-CM

## 2020-09-26 DIAGNOSIS — R29898 Other symptoms and signs involving the musculoskeletal system: Secondary | ICD-10-CM | POA: Diagnosis not present

## 2020-09-26 DIAGNOSIS — L84 Corns and callosities: Secondary | ICD-10-CM | POA: Diagnosis not present

## 2020-09-26 NOTE — Therapy (Signed)
[facility] [place], [place], [address] Phone: [phone]   Fax:  [phone]  Occupational Therapy Treatment  Patient Details  Name: Jennifer Hays MRN: [id number] Date of Birth: 1946-02-13 Referring Provider (OT): Sanjuana Kava, MD   Encounter Date: 09/26/2020   OT End of Session - 09/26/20 1623     Visit Number 5    Number of Visits 8    Date for OT Re-Evaluation 10/25/20    Authorization Type 1) medicare 2) BCBS    Authorization Time Period no authorization needed. No visit limit    Progress Note Due on Visit 10    OT Start Time 1517    OT Stop Time 1555    OT Time Calculation (min) 38 min    Activity Tolerance Patient tolerated treatment well    Behavior During Therapy WFL for tasks assessed/performed             Past Medical History:  Diagnosis Date   Allergy    Asthma    Atrophic vaginitis    Osteopenia     Past Surgical History:  Procedure Laterality Date   ABDOMINAL HYSTERECTOMY     KNEE SURGERY     Right   SPINE SURGERY      There were no vitals filed for this visit.   Subjective Assessment - 09/26/20 1535     Subjective  S: My shoulder is a lot better than it was.    Currently in Pain? No/denies                [facility] OT Assessment - 09/26/20 1535       Assessment   Medical Diagnosis Chronic Right shoulder pain      Precautions   Precautions Fall                      OT Treatments/Exercises (OP) - 09/26/20 1540       Exercises   Exercises Shoulder      Shoulder Exercises: Supine   Protraction PROM;5 reps    Horizontal ABduction PROM;5 reps    External Rotation PROM;5 reps    Internal Rotation PROM;5 reps    Flexion PROM;5 reps    ABduction PROM;5 reps      Shoulder Exercises: Seated   Extension Theraband;12 reps    Theraband Level (Shoulder Extension) Level 3 (Green)    Retraction Theraband;12 reps    Theraband Level (Shoulder Retraction) Level 3  (Green)    Row [employer] reps    Theraband Level (Shoulder Row) Level 2 (Red)    Horizontal ABduction AROM;12 reps    Flexion AROM;12 reps    Abduction AROM;12 reps      Shoulder Exercises: ROM/Strengthening   Proximal Shoulder Strengthening, Seated 10X A/ROM    Prot/Ret//Elev/Dep 1' flexion with green ball    Other ROM/Strengthening Exercises Proximal shoulder strengthening using washcloth; abduction 1'      Manual Therapy   Manual Therapy Myofascial release    Manual therapy comments Manual therapy completed prior to exercises.    Myofascial Release Myofascial release completed to the right upper arm, upper trapezius, and scapularis region to decrease fascial restrictions and increase joint mobility in a pain free zone.                      OT Short Term Goals - 09/15/20 1030       OT SHORT TERM GOAL #1  Title Patient will be educated on HEP to faciliate her progress in therapy and increase her ability to use her RUE for all desired tasks with increased comfort and ease.    Time 3    Period Weeks    Status On-going    Target Date 10/05/20               OT Long Term Goals - 09/15/20 1030       OT LONG TERM GOAL #1   Title Patient will increase her RUE A/ROM to WNL in order to complete required reaching tasks needed with less difficulty.    Time 6    Period Weeks    Status On-going      OT LONG TERM GOAL #2   Title Patient will report a decrease in pain in her right shoulder to 4/10 or less when completing leisure tasks such a painting or during computer use.    Time 6    Period Weeks    Status On-going      OT LONG TERM GOAL #3   Title Patient will increase her right UE strength to 4/5 or better in order to  complete required daily tasks with less difficulty.    Time 6    Period Weeks    Status On-going      OT LONG TERM GOAL #4   Title Patient will decrease her RUE fascial restrictions to min amount or less in order to increase the  functional mobility that is needed during activities such as dressing, bathing, etc.    Time 6    Period Weeks    Status On-going                   Plan - 09/26/20 1623     Clinical Impression Statement A: Manual techniques completed to address fascial restriction in the right upper arm, upper trapezius, and scapularis region. Pt demonstrates full passive and actice ROM. increased scapular strengthening to green band and increase A/ROM to 12 repetitions. Added ball on the wall in flexion for proximal shoulder strengthening. VC for form and technique provided.    Body Structure / Function / Physical Skills ADL;UE functional use;Fascial restriction;Pain;ROM;IADL;Strength    Plan P: Provide green band for scapular strengthening and add to HEP. D/C passive stretching.    Consulted and Agree with Plan of Care Patient             Patient will benefit from skilled therapeutic intervention in order to improve the following deficits and impairments:   Body Structure / Function / Physical Skills: ADL, UE functional use, Fascial restriction, Pain, ROM, IADL, Strength       Visit Diagnosis: Other symptoms and signs involving the musculoskeletal system  Chronic right shoulder pain  Stiffness of right shoulder, not elsewhere classified    Problem List Patient Active Problem List   Diagnosis Date Noted   Vitamin D deficiency 08/05/2018   IC (interstitial cystitis) 02/17/2018   Osteoporosis 05/12/2013   UTI (urinary tract infection) 03/28/2013   Hyperlipidemia 01/19/2013   Colon cancer screening 01/19/2013   Intrinsic asthma 08/10/2012   ALLERGIC RHINITIS 09/10/2007   GERD 09/10/2007   Fibromyalgia 09/10/2007   Jennifer Hays, OTR/L,CBIS  [phone]  09/26/2020, 4:27 PM  [facility] [address] [place], [place], [address] Phone: [phone]   Fax:  [phone]  Name: Jennifer Hays MRN: [id number] Date of Birth:  11-14-1945

## 2020-09-26 NOTE — Progress Notes (Signed)
amb  

## 2020-09-26 NOTE — Progress Notes (Signed)
My shoulder is better.  She has been to OT and has much better ROM of the right shoulder.  I have reviewed the notes.  She has history of falls, multiple, and I will have her see Dr. Merlene Laughter for this and see if there is any cause.  It happens unexpected and sometimes she can grab something to keep her balance.  She uses a cane.  ROM of the right shoulder is full today.  NV intact.  Encounter Diagnoses  Name Primary?   Personal history of fall Yes   Chronic right shoulder pain    Continue OT.  Return in five weeks.  See Dr. Merlene Laughter.  Continue exercises at home.  Call if any problem.  Precautions discussed.  Electronically Signed Sanjuana Kava, MD 7/26/202210:56 AM

## 2020-09-27 DIAGNOSIS — J45909 Unspecified asthma, uncomplicated: Secondary | ICD-10-CM | POA: Diagnosis not present

## 2020-09-27 DIAGNOSIS — J302 Other seasonal allergic rhinitis: Secondary | ICD-10-CM | POA: Diagnosis not present

## 2020-09-27 DIAGNOSIS — M810 Age-related osteoporosis without current pathological fracture: Secondary | ICD-10-CM | POA: Diagnosis not present

## 2020-09-27 DIAGNOSIS — M79671 Pain in right foot: Secondary | ICD-10-CM | POA: Diagnosis not present

## 2020-09-27 DIAGNOSIS — R2689 Other abnormalities of gait and mobility: Secondary | ICD-10-CM | POA: Diagnosis not present

## 2020-09-28 ENCOUNTER — Telehealth (HOSPITAL_COMMUNITY): Payer: Self-pay

## 2020-09-28 ENCOUNTER — Ambulatory Visit (HOSPITAL_COMMUNITY): Payer: BC Managed Care – PPO

## 2020-09-28 NOTE — Telephone Encounter (Signed)
She will be out of town on business and will call us back to r/s when she returns. Please cx all future apptments.

## 2020-10-03 ENCOUNTER — Ambulatory Visit (HOSPITAL_COMMUNITY): Payer: Medicare Other | Admitting: Occupational Therapy

## 2020-10-05 ENCOUNTER — Ambulatory Visit (HOSPITAL_COMMUNITY): Payer: Medicare Other

## 2020-10-10 ENCOUNTER — Encounter (HOSPITAL_COMMUNITY): Payer: Medicare Other | Admitting: Occupational Therapy

## 2020-10-12 ENCOUNTER — Encounter (HOSPITAL_COMMUNITY): Payer: Medicare Other

## 2020-10-17 ENCOUNTER — Encounter (HOSPITAL_COMMUNITY): Payer: Medicare Other | Admitting: Occupational Therapy

## 2020-10-19 ENCOUNTER — Encounter (HOSPITAL_COMMUNITY): Payer: Medicare Other

## 2020-10-24 ENCOUNTER — Encounter (HOSPITAL_COMMUNITY): Payer: Medicare Other | Admitting: Occupational Therapy

## 2020-10-26 ENCOUNTER — Encounter (HOSPITAL_COMMUNITY): Payer: Medicare Other

## 2020-11-07 ENCOUNTER — Other Ambulatory Visit: Payer: Self-pay | Admitting: Family Medicine

## 2020-11-08 DIAGNOSIS — E559 Vitamin D deficiency, unspecified: Secondary | ICD-10-CM | POA: Diagnosis not present

## 2020-11-08 DIAGNOSIS — N3010 Interstitial cystitis (chronic) without hematuria: Secondary | ICD-10-CM | POA: Diagnosis not present

## 2020-11-08 DIAGNOSIS — E785 Hyperlipidemia, unspecified: Secondary | ICD-10-CM | POA: Diagnosis not present

## 2020-11-08 DIAGNOSIS — J45909 Unspecified asthma, uncomplicated: Secondary | ICD-10-CM | POA: Diagnosis not present

## 2020-11-08 DIAGNOSIS — M810 Age-related osteoporosis without current pathological fracture: Secondary | ICD-10-CM | POA: Diagnosis not present

## 2020-11-28 ENCOUNTER — Encounter (HOSPITAL_COMMUNITY): Payer: Self-pay

## 2020-11-28 NOTE — Therapy (Signed)
[place], [place], [address] Phone: [phone]   Fax:  [phone]  Patient Details  Name: Jennifer Hays MRN: [id number] Date of Birth: January 19, 1946 Referring Provider:  No ref. provider found  Encounter Date: 11/28/2020   OCCUPATIONAL THERAPY DISCHARGE SUMMARY  Visits from Start of Care: 5  Current functional level related to goals / functional outcomes:  OT LONG TERM GOAL #1    Title Patient will increase her RUE A/ROM to WNL in order to complete required reaching tasks needed with less difficulty.     Time 6     Period Weeks     Status On-going          OT LONG TERM GOAL #2    Title Patient will report a decrease in pain in her right shoulder to 4/10 or less when completing leisure tasks such a painting or during computer use.     Time 6     Period Weeks     Status On-going          OT LONG TERM GOAL #3    Title Patient will increase her right UE strength to 4/5 or better in order to  complete required daily tasks with less difficulty.     Time 6     Period Weeks     Status On-going          OT LONG TERM GOAL #4    Title Patient will decrease her RUE fascial restrictions to min amount or less in order to increase the functional mobility that is needed during activities such as dressing, bathing, etc.     Time 6     Period Weeks     Status On-going      Remaining deficits: All deficits remain. Patient stopped coming after 5th visit. Pt cancelled her appointments stating that she was going out of town and would schedule more when she returned. Patient never contacted clinic.    Education / Equipment: Shoulder HEP.   Patient agrees to discharge. Patient goals were not met. Patient is being discharged due to not returning since the last visit.Ailene Ravel, OTR/L,CBIS  [phone]  11/28/2020, 11:37 AM  [place], [place], [address] Phone: [phone]   Fax:  [phone]

## 2021-04-17 ENCOUNTER — Other Ambulatory Visit (HOSPITAL_COMMUNITY): Payer: Self-pay | Admitting: Internal Medicine

## 2021-04-17 DIAGNOSIS — Z1231 Encounter for screening mammogram for malignant neoplasm of breast: Secondary | ICD-10-CM

## 2021-05-15 DIAGNOSIS — Z20822 Contact with and (suspected) exposure to covid-19: Secondary | ICD-10-CM | POA: Diagnosis not present

## 2021-05-16 ENCOUNTER — Other Ambulatory Visit: Payer: Self-pay

## 2021-05-16 ENCOUNTER — Ambulatory Visit (HOSPITAL_COMMUNITY)
Admission: RE | Admit: 2021-05-16 | Discharge: 2021-05-16 | Disposition: A | Discharge status: Home / Self Care | Payer: BC Managed Care – PPO | Source: Ambulatory Visit | Attending: Internal Medicine | Admitting: Internal Medicine

## 2021-05-16 DIAGNOSIS — Z1231 Encounter for screening mammogram for malignant neoplasm of breast: Secondary | ICD-10-CM | POA: Diagnosis not present

## 2021-05-16 LAB — HM MAMMOGRAPHY

## 2021-07-05 ENCOUNTER — Telehealth: Payer: Self-pay | Admitting: Orthopaedic Surgery

## 2021-07-05 NOTE — Telephone Encounter (Signed)
Patient called to inquire about seeing Dr Luna Glasgow or one of our providers due to her pain management provider, Dr Brien Few, 'no longer doing pain management.'  I relayed that our clinic does not treat. Patient then asked about 'coming in under orthopaedics if not under pain management.' Please review and advise. ?

## 2021-07-06 NOTE — Telephone Encounter (Signed)
Called patient; relayed. Voiced understand regarding the pain management. Patient is again asking if she may come in for arthritis, knee and back.. States seeing primary care Dr Nevada Crane; said if Dr Nevada Crane thinks she needs orthopaedic visit, may be calling back.  ?

## 2021-08-10 DIAGNOSIS — E663 Overweight: Secondary | ICD-10-CM | POA: Diagnosis not present

## 2021-08-10 DIAGNOSIS — E7801 Familial hypercholesterolemia: Secondary | ICD-10-CM | POA: Diagnosis not present

## 2021-08-10 DIAGNOSIS — J45909 Unspecified asthma, uncomplicated: Secondary | ICD-10-CM | POA: Diagnosis not present

## 2021-08-10 DIAGNOSIS — M810 Age-related osteoporosis without current pathological fracture: Secondary | ICD-10-CM | POA: Diagnosis not present

## 2021-08-10 DIAGNOSIS — G894 Chronic pain syndrome: Secondary | ICD-10-CM | POA: Diagnosis not present

## 2021-08-10 DIAGNOSIS — Z0001 Encounter for general adult medical examination with abnormal findings: Secondary | ICD-10-CM | POA: Diagnosis not present

## 2021-08-10 DIAGNOSIS — L989 Disorder of the skin and subcutaneous tissue, unspecified: Secondary | ICD-10-CM | POA: Diagnosis not present

## 2021-08-10 DIAGNOSIS — N3010 Interstitial cystitis (chronic) without hematuria: Secondary | ICD-10-CM | POA: Diagnosis not present

## 2021-08-10 DIAGNOSIS — Z6825 Body mass index (BMI) 25.0-25.9, adult: Secondary | ICD-10-CM | POA: Diagnosis not present

## 2021-09-27 DIAGNOSIS — E7801 Familial hypercholesterolemia: Secondary | ICD-10-CM | POA: Diagnosis not present

## 2021-09-27 DIAGNOSIS — G894 Chronic pain syndrome: Secondary | ICD-10-CM | POA: Diagnosis not present

## 2021-10-29 DIAGNOSIS — C44329 Squamous cell carcinoma of skin of other parts of face: Secondary | ICD-10-CM | POA: Diagnosis not present

## 2021-11-08 NOTE — Progress Notes (Signed)
Patient: Jennifer Hays   DOB: 03-Nov-1945   76 y.o. Female  MRN: [id number]  Subjective:    Chief Complaint  Patient presents with   Chamois is a 76 y.o. female who presents today to establish care. Introduced to Designer, jewellery role and practice setting. All questions answered. Discussed provider/patient relationship and expectations.  She is a retired Pharmacist, hospital. Reports a history of hip injury and back pain resulting from a trip at work. She has been seen by Orthopedics Dr Luna Glasgow who ordered OT for her falls. She denies recent falls and walks with a cane. Is being followed by Dr Orpah Melter for chronic pain management.  She has also been seeing Dr Allyn Kenner where she had some type of mass removed from her left forehead, follows up in 1-2 weeks.   The 10-year ASCVD risk score (Arnett DK, et al., 2019) is: 13.2%   Values used to calculate the score:     Age: 27 years     Sex: Female     Is Non-Hispanic African American: No     Diabetic: No     Tobacco smoker: No     Systolic Blood Pressure: 099 mmHg     Is BP treated: No     HDL Cholesterol: 52 mg/dL     Total Cholesterol: 248 mg/dL    Most recent fall risk assessment:    11/09/2021    9:04 AM  Fall Risk   Falls in the past year? 1  Number falls in past yr: 0  Injury with Fall? 1  Comment feels sore     Most recent depression screenings:    11/09/2021    9:16 AM 04/26/2020   12:05 PM  PHQ 2/9 Scores  PHQ - 2 Score 0 0  PHQ- 9 Score 0      Patient Care Team: Celene Squibb, MD as PCP - General (Internal Medicine) Arnoldo Lenis, MD as PCP - Cardiology (Cardiology)   Past Medical History:  Diagnosis Date   Allergy    Asthma    Atrophic vaginitis    Osteopenia    Past Surgical History:  Procedure Laterality Date   ABDOMINAL HYSTERECTOMY     KNEE SURGERY     Right   SPINE SURGERY      Outpatient Medications Prior to Visit  Medication Sig   albuterol (PROVENTIL) (2.5 MG/3ML)  0.083% nebulizer solution Take 3 mLs (2.5 mg total) by nebulization every 6 (six) hours as needed for wheezing.   alendronate (FOSAMAX) 70 MG tablet Take with a full glass of water on an empty stomach.   Ascorbic Acid (VITAMIN C) 1000 MG tablet Take 500 mg by mouth daily.    budesonide (PULMICORT) 1 MG/2ML nebulizer solution Take 2 mLs (1 mg total) by nebulization daily.   Cholecalciferol (VITAMIN D) 125 MCG (5000 UT) CAPS Take 1 capsule daily   lidocaine (LINDAMANTLE) 3 % CREA cream SMARTSIG:Sparingly Topical Daily   loratadine (CLARITIN) 10 MG tablet TAKE 1 TABLET BY MOUTH EVERY DAY   Meth-Hyo-M Bl-Na Phos-Ph Sal (URO-MP) 118 MG CAPS TAKE 2 CAPSULES TWICE A DAY AS NEEDED CYSTITIS   Multiple Vitamin (MULTIVITAMIN) tablet Take 1 tablet by mouth daily.   sodium chloride (OCEAN) 0.65 % SOLN nasal spray Place 1 spray into both nostrils as needed for congestion.   tizanidine (ZANAFLEX) 2 MG capsule Take 2 mg by mouth 3 (three) times daily.   WIXELA INHUB 250-50 MCG/ACT  AEPB INHALE 1 PUFF INTO THE LUNGS TWICE A DAY   fenofibrate (TRICOR) 48 MG tablet Take 1 tablet (48 mg total) by mouth 3 (three) times a week. (Patient not taking: Reported on 11/09/2021)   HYDROcodone-acetaminophen (NORCO/VICODIN) 5-325 MG per tablet Take 1 tablet by mouth every 6 (six) hours as needed for moderate pain. (Patient not taking: Reported on 11/09/2021)   No facility-administered medications prior to visit.   Allergies  Allergen Reactions   Clindamycin/Lincomycin     Itching    Penicillins     REACTION: rash, itching, difficulty breathing   Statins      Review of Systems  Musculoskeletal:  Positive for back pain.  All other systems reviewed and are negative.         Objective:     BP 120/78   Pulse (!) 59   Temp 98.6 F (37 C) (Oral)   Ht '5\' 8"'  (1.727 m)   Wt 160 lb (72.6 kg)   SpO2 96%   BMI 24.33 kg/m    Physical Exam Vitals and nursing note reviewed.  Constitutional:      Appearance: Normal  appearance.  HENT:     Head: Normocephalic and atraumatic.     Right Ear: Tympanic membrane normal.     Left Ear: Tympanic membrane normal.     Nose: Nose normal.     Mouth/Throat:     Mouth: Mucous membranes are moist.     Pharynx: Oropharynx is clear.  Eyes:     Extraocular Movements: Extraocular movements intact.     Conjunctiva/sclera: Conjunctivae normal.     Pupils: Pupils are equal, round, and reactive to light.  Cardiovascular:     Rate and Rhythm: Normal rate and regular rhythm.     Pulses: Normal pulses.     Heart sounds: Normal heart sounds.  Pulmonary:     Effort: Pulmonary effort is normal.     Breath sounds: Normal breath sounds.  Abdominal:     General: Bowel sounds are normal.     Palpations: Abdomen is soft.  Musculoskeletal:     Cervical back: Normal range of motion and neck supple.     Right lower leg: No edema.     Left lower leg: No edema.  Skin:    General: Skin is warm and dry.     Comments: Surgical site to left forehead,round, clearly demarcated edges with no surrounding erythema or edema, pink granulation tissue.  Neurological:     General: No focal deficit present.     Mental Status: She is alert.  Psychiatric:        Mood and Affect: Mood normal.        Behavior: Behavior normal.        Thought Content: Thought content normal.        Judgment: Judgment normal.      Results for orders placed or performed in visit on 11/09/21  CBC with Differential/Platelet  Result Value Ref Range   WBC 3.8 3.8 - 10.8 Thousand/uL   RBC 4.77 3.80 - 5.10 Million/uL   Hemoglobin 14.1 11.7 - 15.5 g/dL   HCT 41.8 35.0 - 45.0 %   MCV 87.6 80.0 - 100.0 fL   MCH 29.6 27.0 - 33.0 pg   MCHC 33.7 32.0 - 36.0 g/dL   RDW 12.6 11.0 - 15.0 %   Platelets 186 140 - 400 Thousand/uL   MPV 12.4 7.5 - 12.5 fL   Neutro Abs 1,535 1,500 - 7,800 cells/uL  Lymphs Abs 1,740 850 - 3,900 cells/uL   Absolute Monocytes 357 200 - 950 cells/uL   Eosinophils Absolute 137 15 - 500  cells/uL   Basophils Absolute 30 0 - 200 cells/uL   Neutrophils Relative % 40.4 %   Total Lymphocyte 45.8 %   Monocytes Relative 9.4 %   Eosinophils Relative 3.6 %   Basophils Relative 0.8 %  COMPLETE METABOLIC PANEL WITH GFR  Result Value Ref Range   Glucose, Bld 90 65 - 99 mg/dL   BUN 11 7 - 25 mg/dL   Creat 0.68 0.60 - 1.00 mg/dL   eGFR 91 > OR = 60 mL/min/1.56m   BUN/Creatinine Ratio SEE NOTE: 6 - 22 (calc)   Sodium 139 135 - 146 mmol/L   Potassium 4.2 3.5 - 5.3 mmol/L   Chloride 104 98 - 110 mmol/L   CO2 28 20 - 32 mmol/L   Calcium 9.4 8.6 - 10.4 mg/dL   Total Protein 6.5 6.1 - 8.1 g/dL   Albumin 4.3 3.6 - 5.1 g/dL   Globulin 2.2 1.9 - 3.7 g/dL (calc)   AG Ratio 2.0 1.0 - 2.5 (calc)   Total Bilirubin 0.6 0.2 - 1.2 mg/dL   Alkaline phosphatase (APISO) 75 37 - 153 U/L   AST 21 10 - 35 U/L   ALT 22 6 - 29 U/L  Lipid panel  Result Value Ref Range   Cholesterol 248 (H) <200 mg/dL   HDL 52 > OR = 50 mg/dL   Triglycerides 172 (H) <150 mg/dL   LDL Cholesterol (Calc) 164 (H) mg/dL (calc)   Total CHOL/HDL Ratio 4.8 <5.0 (calc)   Non-HDL Cholesterol (Calc) 196 (H) <130 mg/dL (calc)       Assessment & Plan:    Routine Health Maintenance and Physical Exam  Immunization History  Administered Date(s) Administered   Fluad Quad(high Dose 65+) 12/03/2018   H1N1 01/10/2008   Influenza, High Dose Seasonal PF 12/03/2017, 11/09/2019   Influenza,inj,Quad PF,6+ Mos 01/05/2013, 01/06/2014, 11/14/2014   Influenza-Unspecified 12/27/2010, 12/20/2015   Moderna Sars-Covid-2 Vaccination 11/09/2019, 12/09/2019   Pneumococcal Conjugate-13 01/18/2013   Pneumococcal Polysaccharide-23 12/16/2005, 05/05/2017   Td 09/26/2011   Tdap 09/26/2011    Health Maintenance  Topic Date Due   Zoster Vaccines- Shingrix (1 of 2) Never done   COVID-19 Vaccine (3 - Moderna risk series) 01/06/2020   TETANUS/TDAP  09/25/2021   INFLUENZA VACCINE  10/02/2021   Fecal DNA (Cologuard)  09/07/2022    MAMMOGRAM  05/17/2023   Pneumonia Vaccine 76 Years old  Completed   DEXA SCAN  Completed   Hepatitis C Screening  Completed   HPV VACCINES  Aged Out    Discussed health benefits of physical activity, and encouraged her to engage in regular exercise appropriate for her age and condition.  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 Albulterol use is once monthly with no flares. She will obtain her flu shot at [facility].   Mixed Hyperlipidemia Not currently taking Tricor, does not tolerate Statins. Educated on importance of diet control and encouraged DASH diet. Cholesterol remains elevated on labs today, will restart Tricor and recheck labs in 3 months.  Cologuard 09/07/19 UTD Mammogram  05/16/21 UTD DEXA 08/13/19- Will order  Immunizations will be obtained at [facility]  Will call with lab results. Follow up in 1 year for physical exam.     Rubie Maid, FNP

## 2021-11-09 ENCOUNTER — Encounter: Payer: Self-pay | Admitting: Family Medicine

## 2021-11-09 ENCOUNTER — Ambulatory Visit (INDEPENDENT_AMBULATORY_CARE_PROVIDER_SITE_OTHER): Payer: Medicare Other | Admitting: Family Medicine

## 2021-11-09 VITALS — BP 120/78 | HR 59 | Temp 98.6°F | Ht 68.0 in | Wt 160.0 lb

## 2021-11-09 DIAGNOSIS — E782 Mixed hyperlipidemia: Secondary | ICD-10-CM | POA: Diagnosis not present

## 2021-11-09 DIAGNOSIS — N3010 Interstitial cystitis (chronic) without hematuria: Secondary | ICD-10-CM | POA: Diagnosis not present

## 2021-11-09 DIAGNOSIS — Z0000 Encounter for general adult medical examination without abnormal findings: Secondary | ICD-10-CM

## 2021-11-09 DIAGNOSIS — M816 Localized osteoporosis [Lequesne]: Secondary | ICD-10-CM

## 2021-11-09 DIAGNOSIS — J45909 Unspecified asthma, uncomplicated: Secondary | ICD-10-CM | POA: Diagnosis not present

## 2021-11-10 LAB — CBC WITH DIFFERENTIAL/PLATELET
Absolute Monocytes: 357 cells/uL (ref 200–950)
Basophils Absolute: 30 cells/uL (ref 0–200)
Basophils Relative: 0.8 %
Eosinophils Absolute: 137 cells/uL (ref 15–500)
Eosinophils Relative: 3.6 %
HCT: 41.8 % (ref 35.0–45.0)
Hemoglobin: 14.1 g/dL (ref 11.7–15.5)
Lymphs Abs: 1740 cells/uL (ref 850–3900)
MCH: 29.6 pg (ref 27.0–33.0)
MCHC: 33.7 g/dL (ref 32.0–36.0)
MCV: 87.6 fL (ref 80.0–100.0)
MPV: 12.4 fL (ref 7.5–12.5)
Monocytes Relative: 9.4 %
Neutro Abs: 1535 cells/uL (ref 1500–7800)
Neutrophils Relative %: 40.4 %
Platelets: 186 10*3/uL (ref 140–400)
RBC: 4.77 10*6/uL (ref 3.80–5.10)
RDW: 12.6 % (ref 11.0–15.0)
Total Lymphocyte: 45.8 %
WBC: 3.8 10*3/uL (ref 3.8–10.8)

## 2021-11-10 LAB — COMPLETE METABOLIC PANEL WITH GFR
AG Ratio: 2 (calc) (ref 1.0–2.5)
ALT: 22 U/L (ref 6–29)
AST: 21 U/L (ref 10–35)
Albumin: 4.3 g/dL (ref 3.6–5.1)
Alkaline phosphatase (APISO): 75 U/L (ref 37–153)
BUN: 11 mg/dL (ref 7–25)
CO2: 28 mmol/L (ref 20–32)
Calcium: 9.4 mg/dL (ref 8.6–10.4)
Chloride: 104 mmol/L (ref 98–110)
Creat: 0.68 mg/dL (ref 0.60–1.00)
Globulin: 2.2 g/dL (calc) (ref 1.9–3.7)
Glucose, Bld: 90 mg/dL (ref 65–99)
Potassium: 4.2 mmol/L (ref 3.5–5.3)
Sodium: 139 mmol/L (ref 135–146)
Total Bilirubin: 0.6 mg/dL (ref 0.2–1.2)
Total Protein: 6.5 g/dL (ref 6.1–8.1)
eGFR: 91 mL/min/{1.73_m2} (ref >=60)

## 2021-11-10 LAB — LIPID PANEL
Cholesterol: 248 mg/dL — ABNORMAL HIGH (ref <200)
HDL: 52 mg/dL (ref >=50)
LDL Cholesterol (Calc): 164 mg/dL (calc) — ABNORMAL HIGH
Non-HDL Cholesterol (Calc): 196 mg/dL (calc) — ABNORMAL HIGH (ref <130)
Total CHOL/HDL Ratio: 4.8 (calc) (ref <5.0)
Triglycerides: 172 mg/dL — ABNORMAL HIGH (ref <150)

## 2021-11-12 ENCOUNTER — Other Ambulatory Visit: Payer: Self-pay

## 2021-11-12 ENCOUNTER — Encounter: Payer: Self-pay | Admitting: Family Medicine

## 2021-11-12 DIAGNOSIS — M816 Localized osteoporosis [Lequesne]: Secondary | ICD-10-CM

## 2021-11-12 MED ORDER — FENOFIBRATE 48 MG PO TABS: 48.0000 mg | ORAL_TABLET | ORAL | 3 refills | Status: DC

## 2021-11-12 NOTE — Addendum Note (Signed)
Addended by: Rubie Maid on: 11/12/2021 10:11 AM   Modules accepted: Level of Service

## 2021-11-16 ENCOUNTER — Encounter: Payer: Self-pay | Admitting: Family Medicine

## 2021-12-13 DIAGNOSIS — C4442 Squamous cell carcinoma of skin of scalp and neck: Secondary | ICD-10-CM | POA: Diagnosis not present

## 2021-12-13 DIAGNOSIS — B078 Other viral warts: Secondary | ICD-10-CM | POA: Diagnosis not present

## 2022-01-10 DIAGNOSIS — B078 Other viral warts: Secondary | ICD-10-CM | POA: Diagnosis not present

## 2022-01-10 DIAGNOSIS — L708 Other acne: Secondary | ICD-10-CM | POA: Diagnosis not present

## 2022-01-10 DIAGNOSIS — Z85828 Personal history of other malignant neoplasm of skin: Secondary | ICD-10-CM | POA: Diagnosis not present

## 2022-01-10 DIAGNOSIS — Z08 Encounter for follow-up examination after completed treatment for malignant neoplasm: Secondary | ICD-10-CM | POA: Diagnosis not present

## 2022-02-11 ENCOUNTER — Ambulatory Visit (INDEPENDENT_AMBULATORY_CARE_PROVIDER_SITE_OTHER): Payer: BC Managed Care – PPO | Admitting: Family Medicine

## 2022-02-11 VITALS — BP 124/78 | HR 58 | Temp 97.7°F | Wt 161.0 lb

## 2022-02-11 DIAGNOSIS — J45909 Unspecified asthma, uncomplicated: Secondary | ICD-10-CM | POA: Diagnosis not present

## 2022-02-11 DIAGNOSIS — R5383 Other fatigue: Secondary | ICD-10-CM | POA: Diagnosis not present

## 2022-02-11 DIAGNOSIS — M816 Localized osteoporosis [Lequesne]: Secondary | ICD-10-CM | POA: Diagnosis not present

## 2022-02-11 DIAGNOSIS — E782 Mixed hyperlipidemia: Secondary | ICD-10-CM

## 2022-02-11 DIAGNOSIS — E559 Vitamin D deficiency, unspecified: Secondary | ICD-10-CM

## 2022-02-11 MED ORDER — ALENDRONATE SODIUM 70 MG PO TABS: ORAL_TABLET | ORAL | 1 refills | Status: AC

## 2022-02-11 MED ORDER — ALBUTEROL SULFATE HFA 108 (90 BASE) MCG/ACT IN AERS: 2.0000 | INHALATION_SPRAY | Freq: Four times a day (QID) | RESPIRATORY_TRACT | 0 refills | Status: DC | PRN

## 2022-02-11 NOTE — Assessment & Plan Note (Signed)
Chronic. Off previous medication regimen for some time and is having occasional shortness of breath. She cannot identify how often, but denies daily. We discussed options and she would like to start with Albuterol inhaler PRN. Instructed to call office for worsening shortness of breath or wheezing not relieved by Albuterol or for frequent Albuterol use as she may need a daily regimen.

## 2022-02-11 NOTE — Assessment & Plan Note (Signed)
Patient has not been taking her Fosamax for some time and would like to restart this. Will order f/u DEXA and Vitamin D levels. She does report a recent fall due to her hip, ambulates with a cane. She does take daily Vitamin D.

## 2022-02-11 NOTE — Progress Notes (Signed)
Acute Office Visit  Subjective:     Patient ID: Jennifer Hays, female    DOB: 1945-08-29, 76 y.o.   MRN: [id number]  Chief Complaint  Patient presents with   Hyperlipidemia    Follow up - has not started cholesterol medication  Mentions hx of skin cancer seen by Dr Nevada Crane and need for refill of her asthma medication as well as alendronate    Hyperlipidemia   Patient is in today for hyperlipidemia follow-up. She has not restarted her Tricor but has been doing dietary restrictions and exercise daily walking and/or yoga. She is trying to minimize her medications. Here for lipids recheck.  She reports shortness of breath seasonally that has been flaring up. She has a PMH of asthma with Pulmicort and Wixela ordered daily and PRN Albuterol. She has been off of her inhalers for some time and has not felt she needed them until recently. She was taking the Wixela and Albuterol PRN. She would like to switch to albuterol inhaler to help control this and avoid daily medications.  Review of Systems  All other systems reviewed and are negative.       Objective:    BP 124/78   Pulse (!) 58   Temp 97.7 F (36.5 C)   Wt 161 lb (73 kg)   SpO2 97%   BMI 24.48 kg/m    Physical Exam Vitals and nursing note reviewed.  Constitutional:      Appearance: Normal appearance. She is normal weight.  HENT:     Head: Normocephalic and atraumatic.  Cardiovascular:     Rate and Rhythm: Normal rate and regular rhythm.     Pulses: Normal pulses.     Heart sounds: Normal heart sounds.  Pulmonary:     Effort: Pulmonary effort is normal.     Breath sounds: Normal breath sounds.  Skin:    General: Skin is warm and dry.  Neurological:     General: No focal deficit present.     Mental Status: She is alert and oriented to person, place, and time. Mental status is at baseline.  Psychiatric:        Mood and Affect: Mood normal.        Behavior: Behavior normal.        Thought Content: Thought content  normal.        Judgment: Judgment normal.     No results found for any visits on 02/11/22.      Assessment & Plan:   Problem List Items Addressed This Visit       Respiratory   Intrinsic asthma    Chronic. Off previous medication regimen for some time and is having occasional shortness of breath. She cannot identify how often, but denies daily. We discussed options and she would like to start with Albuterol inhaler PRN. Instructed to call office for worsening shortness of breath or wheezing not relieved by Albuterol or for frequent Albuterol use as she may need a daily regimen.      Relevant Medications   albuterol (VENTOLIN HFA) 108 (90 Base) MCG/ACT inhaler     Musculoskeletal and Integument   Osteoporosis    Patient has not been taking her Fosamax for some time and would like to restart this. Will order f/u DEXA and Vitamin D levels. She does report a recent fall due to her hip, ambulates with a cane. She does take daily Vitamin D.      Relevant Medications   alendronate (FOSAMAX) 70 MG  tablet   Other Relevant Orders   DG Bone Density     Other   Hyperlipidemia - Primary    Fasting lipids drawn today. Off medications at this time and trying lifestyle modifications. Will follow-up on labs and treat as appropriate.      Relevant Orders   Lipid panel   Vitamin D deficiency   Relevant Orders   VITAMIN D 25 Hydroxy (Vit-D Deficiency, Fractures)   Other Visit Diagnoses     Fatigue, unspecified type       Relevant Orders   CBC with Differential/Platelet       Meds ordered this encounter  Medications   albuterol (VENTOLIN HFA) 108 (90 Base) MCG/ACT inhaler    Sig: Inhale 2 puffs into the lungs every 6 (six) hours as needed for wheezing or shortness of breath.    Dispense:  8 g    Refill:  0    Order Specific Question:   Supervising Provider    Answer:   Jenna Luo T [[id number]]   alendronate (FOSAMAX) 70 MG tablet    Sig: Take with a full glass of water on an  empty stomach.    Dispense:  12 tablet    Refill:  1    Order Specific Question:   Supervising Provider    Answer:   Jenna Luo T [[id number]]    Return in about 6 months (around 08/13/2022).  Rubie Maid, FNP

## 2022-02-11 NOTE — Assessment & Plan Note (Signed)
Fasting lipids drawn today. Off medications at this time and trying lifestyle modifications. Will follow-up on labs and treat as appropriate.

## 2022-02-12 DIAGNOSIS — L821 Other seborrheic keratosis: Secondary | ICD-10-CM | POA: Diagnosis not present

## 2022-02-12 DIAGNOSIS — C44329 Squamous cell carcinoma of skin of other parts of face: Secondary | ICD-10-CM | POA: Diagnosis not present

## 2022-02-12 LAB — CBC WITH DIFFERENTIAL/PLATELET
Absolute Monocytes: 309 {cells}/uL (ref 200–950)
Basophils Absolute: 51 {cells}/uL (ref 0–200)
Basophils Relative: 1.5 %
Eosinophils Absolute: 99 {cells}/uL (ref 15–500)
Eosinophils Relative: 2.9 %
HCT: 42 % (ref 35.0–45.0)
Hemoglobin: 13.8 g/dL (ref 11.7–15.5)
Lymphs Abs: 1459 {cells}/uL (ref 850–3900)
MCH: 29.2 pg (ref 27.0–33.0)
MCHC: 32.9 g/dL (ref 32.0–36.0)
MCV: 89 fL (ref 80.0–100.0)
MPV: 12.5 fL (ref 7.5–12.5)
Monocytes Relative: 9.1 %
Neutro Abs: 1482 {cells}/uL — ABNORMAL LOW (ref 1500–7800)
Neutrophils Relative %: 43.6 %
Platelets: 194 Thousand/uL (ref 140–400)
RBC: 4.72 Million/uL (ref 3.80–5.10)
RDW: 12.7 % (ref 11.0–15.0)
Total Lymphocyte: 42.9 %
WBC: 3.4 Thousand/uL — ABNORMAL LOW (ref 3.8–10.8)

## 2022-02-12 LAB — LIPID PANEL
Cholesterol: 260 mg/dL — ABNORMAL HIGH (ref <200)
HDL: 54 mg/dL (ref >=50)
LDL Cholesterol (Calc): 177 mg/dL — ABNORMAL HIGH
Non-HDL Cholesterol (Calc): 206 mg/dL — ABNORMAL HIGH (ref <130)
Total CHOL/HDL Ratio: 4.8 (calc) (ref <5.0)
Triglycerides: 147 mg/dL (ref <150)

## 2022-02-12 LAB — VITAMIN D 25 HYDROXY (VIT D DEFICIENCY, FRACTURES): Vit D, 25-Hydroxy: 53 ng/mL (ref 30–100)

## 2022-02-14 ENCOUNTER — Telehealth: Payer: Self-pay | Admitting: Family Medicine

## 2022-02-14 DIAGNOSIS — C44329 Squamous cell carcinoma of skin of other parts of face: Secondary | ICD-10-CM | POA: Diagnosis not present

## 2022-02-14 HISTORY — PX: SKIN CANCER DESTRUCTION: SHX778

## 2022-02-14 NOTE — Telephone Encounter (Signed)
Left message for patient to call back and schedule Medicare Annual Wellness Visit (AWV) in office.   If not able to come in office, please offer to do virtually or by telephone.   Last AWV: 08/10/2019   Please schedule at any time with [facility]Nurse Health Advisor.  30 minute appointment for Virtual or phone  45 minute appointment for in office or Initial virtual/phone  Any questions, please contact me at [phone]

## 2022-02-18 ENCOUNTER — Other Ambulatory Visit: Payer: Self-pay | Admitting: Family Medicine

## 2022-02-18 MED ORDER — EZETIMIBE 10 MG PO TABS: 10.0000 mg | ORAL_TABLET | Freq: Every day | ORAL | 3 refills | Status: DC

## 2022-03-06 ENCOUNTER — Other Ambulatory Visit: Payer: Self-pay | Admitting: Family Medicine

## 2022-03-06 DIAGNOSIS — J45909 Unspecified asthma, uncomplicated: Secondary | ICD-10-CM

## 2022-03-26 ENCOUNTER — Telehealth: Payer: Self-pay | Admitting: Family Medicine

## 2022-03-26 NOTE — Telephone Encounter (Signed)
Left message for patient to call back and schedule Medicare Annual Wellness Visit (AWV) in office.   If not able to come in office, please offer to do virtually or by telephone.   Last AWV:08/10/2019   Please schedule at any time with [facility]-Nurse Health Advisor.  30 minute appointment  Any questions, please contact me at [phone]   Thank you,   [facility]  Ambulatory Clinical Support for [place] [facility] ??[phone] or ??[phone]

## 2022-04-03 ENCOUNTER — Telehealth: Payer: Self-pay

## 2022-04-03 ENCOUNTER — Ambulatory Visit (INDEPENDENT_AMBULATORY_CARE_PROVIDER_SITE_OTHER): Payer: BC Managed Care – PPO

## 2022-04-03 VITALS — Ht 68.0 in | Wt 161.0 lb

## 2022-04-03 DIAGNOSIS — Z0000 Encounter for general adult medical examination without abnormal findings: Secondary | ICD-10-CM

## 2022-04-03 DIAGNOSIS — Z1211 Encounter for screening for malignant neoplasm of colon: Secondary | ICD-10-CM

## 2022-04-03 DIAGNOSIS — Z1231 Encounter for screening mammogram for malignant neoplasm of breast: Secondary | ICD-10-CM

## 2022-04-03 NOTE — Patient Instructions (Signed)
Jennifer Hays , Thank you for taking time to come for your Medicare Wellness Visit. I appreciate your ongoing commitment to your health goals. Please review the following plan we discussed and let me know if I can assist you in the future.   These are the goals we discussed:  Goals      DIET - REDUCE CALORIE INTAKE     Pt would like to reduce cholesterol.        This is a list of the screening recommended for you and due dates:  Health Maintenance  Topic Date Due   Zoster (Shingles) Vaccine (1 of 2) Never done   COVID-19 Vaccine (3 - Moderna risk series) 01/06/2020   DTaP/Tdap/Td vaccine (3 - Td or Tdap) 09/25/2021   Cologuard (Stool DNA test)  09/07/2022   Medicare Annual Wellness Visit  04/04/2023   Mammogram  05/17/2023   Pneumonia Vaccine  Completed   Flu Shot  Completed   DEXA scan (bone density measurement)  Completed   Hepatitis C Screening: USPSTF Recommendation to screen - Ages 54-79 yo.  Completed   HPV Vaccine  Aged Out    Advanced directives: Please bring a copy of your health care power of attorney and living will to the office to be added to your chart at your convenience.   Conditions/risks identified: Aim for 30 minutes of exercise or brisk walking, 6-8 glasses of water, and 5 servings of fruits and vegetables each day.   Next appointment: Follow up in one year for your annual wellness visit 03/2023.   Preventive Care 75 Years and Older, Female Preventive care refers to lifestyle choices and visits with your health care provider that can promote health and wellness. What does preventive care include? A yearly physical exam. This is also called an annual well check. Dental exams once or twice a year. Routine eye exams. Ask your health care provider how often you should have your eyes checked. Personal lifestyle choices, including: Daily care of your teeth and gums. Regular physical activity. Eating a healthy diet. Avoiding tobacco and drug use. Limiting  alcohol use. Practicing safe sex. Taking low-dose aspirin every day. Taking vitamin and mineral supplements as recommended by your health care provider. What happens during an annual well check? The services and screenings done by your health care provider during your annual well check will depend on your age, overall health, lifestyle risk factors, and family history of disease. Counseling  Your health care provider may ask you questions about your: Alcohol use. Tobacco use. Drug use. Emotional well-being. Home and relationship well-being. Sexual activity. Eating habits. History of falls. Memory and ability to understand (cognition). Work and work Statistician. Reproductive health. Screening  You may have the following tests or measurements: Height, weight, and BMI. Blood pressure. Lipid and cholesterol levels. These may be checked every 5 years, or more frequently if you are over 75 years old. Skin check. Lung cancer screening. You may have this screening every year starting at age 75 if you have a 30-pack-year history of smoking and currently smoke or have quit within the past 15 years. Fecal occult blood test (FOBT) of the stool. You may have this test every year starting at age 75. Flexible sigmoidoscopy or colonoscopy. You may have a sigmoidoscopy every 5 years or a colonoscopy every 10 years starting at age 75. Hepatitis C blood test. Hepatitis B blood test. Sexually transmitted disease (STD) testing. Diabetes screening. This is done by checking your blood sugar (glucose) after you  have not eaten for a while (fasting). You may have this done every 1-3 years. Bone density scan. This is done to screen for osteoporosis. You may have this done starting at age 75. Mammogram. This may be done every 1-2 years. Talk to your health care provider about how often you should have regular mammograms. Talk with your health care provider about your test results, treatment options, and if  necessary, the need for more tests. Vaccines  Your health care provider may recommend certain vaccines, such as: Influenza vaccine. This is recommended every year. Tetanus, diphtheria, and acellular pertussis (Tdap, Td) vaccine. You may need a Td booster every 10 years. Zoster vaccine. You may need this after age 75. Pneumococcal 13-valent conjugate (PCV13) vaccine. One dose is recommended after age 75. Pneumococcal polysaccharide (PPSV23) vaccine. One dose is recommended after age 34. Talk to your health care provider about which screenings and vaccines you need and how often you need them. This information is not intended to replace advice given to you by your health care provider. Make sure you discuss any questions you have with your health care provider. Document Released: 03/17/2015 Document Revised: 11/08/2015 Document Reviewed: 12/20/2014 Elsevier Interactive Patient Education  2017 [place] Prevention in the Home Falls can cause injuries. They can happen to people of all ages. There are many things you can do to make your home safe and to help prevent falls. What can I do on the outside of my home? Regularly fix the edges of walkways and driveways and fix any cracks. Remove anything that might make you trip as you walk through a door, such as a raised step or threshold. Trim any bushes or trees on the path to your home. Use bright outdoor lighting. Clear any walking paths of anything that might make someone trip, such as rocks or tools. Regularly check to see if handrails are loose or broken. Make sure that both sides of any steps have handrails. Any raised decks and porches should have guardrails on the edges. Have any leaves, snow, or ice cleared regularly. Use sand or salt on walking paths during winter. Clean up any spills in your garage right away. This includes oil or grease spills. What can I do in the bathroom? Use night lights. Install grab bars by the toilet  and in the tub and shower. Do not use towel bars as grab bars. Use non-skid mats or decals in the tub or shower. If you need to sit down in the shower, use a plastic, non-slip stool. Keep the floor dry. Clean up any water that spills on the floor as soon as it happens. Remove soap buildup in the tub or shower regularly. Attach bath mats securely with double-sided non-slip rug tape. Do not have throw rugs and other things on the floor that can make you trip. What can I do in the bedroom? Use night lights. Make sure that you have a light by your bed that is easy to reach. Do not use any sheets or blankets that are too big for your bed. They should not hang down onto the floor. Have a firm chair that has side arms. You can use this for support while you get dressed. Do not have throw rugs and other things on the floor that can make you trip. What can I do in the kitchen? Clean up any spills right away. Avoid walking on wet floors. Keep items that you use a lot in easy-to-reach places. If you need  to reach something above you, use a strong step stool that has a grab bar. Keep electrical cords out of the way. Do not use floor polish or wax that makes floors slippery. If you must use wax, use non-skid floor wax. Do not have throw rugs and other things on the floor that can make you trip. What can I do with my stairs? Do not leave any items on the stairs. Make sure that there are handrails on both sides of the stairs and use them. Fix handrails that are broken or loose. Make sure that handrails are as long as the stairways. Check any carpeting to make sure that it is firmly attached to the stairs. Fix any carpet that is loose or worn. Avoid having throw rugs at the top or bottom of the stairs. If you do have throw rugs, attach them to the floor with carpet tape. Make sure that you have a light switch at the top of the stairs and the bottom of the stairs. If you do not have them, ask someone to add  them for you. What else can I do to help prevent falls? Wear shoes that: Do not have high heels. Have rubber bottoms. Are comfortable and fit you well. Are closed at the toe. Do not wear sandals. If you use a stepladder: Make sure that it is fully opened. Do not climb a closed stepladder. Make sure that both sides of the stepladder are locked into place. Ask someone to hold it for you, if possible. Clearly mark and make sure that you can see: Any grab bars or handrails. First and last steps. Where the edge of each step is. Use tools that help you move around (mobility aids) if they are needed. These include: Canes. Walkers. Scooters. Crutches. Turn on the lights when you go into a dark area. Replace any light bulbs as soon as they burn out. Set up your furniture so you have a clear path. Avoid moving your furniture around. If any of your floors are uneven, fix them. If there are any pets around you, be aware of where they are. Review your medicines with your doctor. Some medicines can make you feel dizzy. This can increase your chance of falling. Ask your doctor what other things that you can do to help prevent falls. This information is not intended to replace advice given to you by your health care provider. Make sure you discuss any questions you have with your health care provider. Document Released: 12/15/2008 Document Revised: 07/27/2015 Document Reviewed: 03/25/2014 Elsevier Interactive Patient Education  2017 [employer].

## 2022-04-03 NOTE — Progress Notes (Signed)
Subjective:   Jennifer Hays is a 77 y.o. female who presents for Medicare Annual (Subsequent) preventive examination. Virtual Visit via Telephone Note  I connected with  Jennifer Hays on 04/03/22 at  2:10 PM EST by telephone and verified that I am speaking with the correct person using two identifiers.  Location: Patient: HOME Provider: [facility] Persons participating in the virtual visit: patient/Nurse Health Advisor   I discussed the limitations, risks, security and privacy concerns of performing an evaluation and management service by telephone and the availability of in person appointments. The patient expressed understanding and agreed to proceed.  Interactive audio and video telecommunications were attempted between this nurse and patient, however failed, due to patient having technical difficulties OR patient did not have access to video capability.  We continued and completed visit with audio only.  Some vital signs may be absent or patient reported.   Chriss Driver, LPN  Review of Systems     Cardiac Risk Factors include: advanced age (>52mn, >>68women);dyslipidemia     Objective:    Today's Vitals   04/03/22 1357 04/03/22 1358  Weight: 161 lb (73 kg)   Height: '5\' 8"'$  (1.727 m)   PainSc:  5    Body mass index is 24.48 kg/m.     04/03/2022    2:09 PM 09/13/2020    4:51 PM 08/10/2019   10:48 AM 08/05/2018    3:02 PM 07/17/2016    1:13 PM  Advanced Directives  Does Patient Have a Medical Advance Directive? Yes No Yes Yes Yes  Type of [place] will   [place];Out of facility DNR (pink MOST or yellow form)   Does patient want to make changes to medical advance directive?   No - Patient declined  No - Patient declined  Copy of [place] Chart? No - copy requested      Would patient like information on creating a medical advance directive?  No - Patient declined        Current Medications (verified) Outpatient Encounter Medications as of 04/03/2022  Medication Sig   albuterol (VENTOLIN HFA) 108 (90 Base) MCG/ACT inhaler TAKE 2 PUFFS BY MOUTH EVERY 6 HOURS AS NEEDED FOR WHEEZE OR SHORTNESS OF BREATH   alendronate (FOSAMAX) 70 MG tablet Take with a full glass of water on an empty stomach.   Ascorbic Acid (VITAMIN C) 1000 MG tablet Take 500 mg by mouth daily.    budesonide (PULMICORT) 1 MG/2ML nebulizer solution Take 2 mLs (1 mg total) by nebulization daily.   Cholecalciferol (VITAMIN D) 125 MCG (5000 UT) CAPS Take 1 capsule daily   HYDROcodone-acetaminophen (NORCO/VICODIN) 5-325 MG per tablet Take 1 tablet by mouth every 6 (six) hours as needed for moderate pain.   lidocaine (LINDAMANTLE) 3 % CREA cream SMARTSIG:Sparingly Topical Daily   loratadine (CLARITIN) 10 MG tablet TAKE 1 TABLET BY MOUTH EVERY DAY   Meth-Hyo-M Bl-Na Phos-Ph Sal (URO-MP) 118 MG CAPS TAKE 2 CAPSULES TWICE A DAY AS NEEDED CYSTITIS   Multiple Vitamin (MULTIVITAMIN) tablet Take 1 tablet by mouth daily.   sodium chloride (OCEAN) 0.65 % SOLN nasal spray Place 1 spray into both nostrils as needed for congestion.   tizanidine (ZANAFLEX) 2 MG capsule Take 2 mg by mouth 3 (three) times daily.   WIXELA INHUB 250-50 MCG/ACT AEPB INHALE 1 PUFF INTO THE LUNGS TWICE A DAY   ezetimibe (ZETIA) 10 MG tablet Take 1  tablet (10 mg total) by mouth daily. (Patient not taking: Reported on 04/03/2022)   No facility-administered encounter medications on file as of 04/03/2022.    Allergies (verified) Clindamycin/lincomycin, Penicillins, and Statins   History: Past Medical History:  Diagnosis Date   Allergy    Asthma    Atrophic vaginitis    Osteopenia    Past Surgical History:  Procedure Laterality Date   ABDOMINAL HYSTERECTOMY     KNEE SURGERY     Right   SKIN CANCER DESTRUCTION N/A 02/14/2022   Dr. Fredia Sorrow   SPINE SURGERY     History reviewed. No pertinent family history. Social History    Socioeconomic History   Marital status: Married    Spouse name: Elenore Rota   Number of children: Not on file   Years of education: Not on file   Highest education level: Not on file  Occupational History   Not on file  Tobacco Use   Smoking status: Former   Smokeless tobacco: Never   Tobacco comments:    quit 1970's  Vaping Use   Vaping Use: Never used  Substance and Sexual Activity   Alcohol use: No    Alcohol/week: 0.0 standard drinks of alcohol   Drug use: No   Sexual activity: Yes  Other Topics Concern   Not on file  Social History Narrative   Retired Pharmacist, hospital.   Social Determinants of Health   Financial Resource Strain: Not on file  Food Insecurity: No Food Insecurity (04/03/2022)   Hunger Vital Sign    Worried About Running Out of Food in the Last Year: Never true    Ran Out of Food in the Last Year: Never true  Transportation Needs: No Transportation Needs (04/03/2022)   PRAPARE - Hydrologist (Medical): No    Lack of Transportation (Non-Medical): No  Physical Activity: Sufficiently Active (04/03/2022)   Exercise Vital Sign    Days of Exercise per Week: 5 days    Minutes of Exercise per Session: 30 min  Stress: No Stress Concern Present (04/03/2022)   [place]    Feeling of Stress : Not at all  Social Connections: [place] (04/03/2022)   Social Connection and Isolation Panel [NHANES]    Frequency of Communication with Friends and Family: More than three times a week    Frequency of Social Gatherings with Friends and Family: More than three times a week    Attends Religious Services: More than 4 times per year    Active Member of [employer] or Organizations: Yes    Attends Music therapist: More than 4 times per year    Marital Status: Married    Tobacco Counseling Counseling given: Not Answered Tobacco comments: quit 1970's   Clinical  Intake:  Pre-visit preparation completed: Yes  Pain : 0-10 Pain Score: 5  Pain Type: Chronic pain Pain Location: Hip Pain Orientation: Right Pain Descriptors / Indicators: Aching, Discomfort Pain Onset: More than a month ago Pain Frequency: Intermittent     BMI - recorded: 24.48 Nutritional Status: BMI of 19-24  Normal Nutritional Risks: None Diabetes: No  How often do you need to have someone help you when you read instructions, pamphlets, or other written materials from your doctor or pharmacy?: 1 - Never  Diabetic?NO  Interpreter Needed?: No  Information entered by :: mj Carla Rashad, lpn   Activities of Daily Living    04/03/2022    2:12 PM  In your present state of health, do you have any difficulty performing the following activities:  Hearing? 0  Vision? 0  Difficulty concentrating or making decisions? 0  Walking or climbing stairs? 1  Comment Uses cane at times.  Dressing or bathing? 0  Doing errands, shopping? 0  Preparing Food and eating ? N  Using the Toilet? N  In the past six months, have you accidently leaked urine? N  Do you have problems with loss of bowel control? N  Managing your Medications? N  Managing your Finances? N  Housekeeping or managing your Housekeeping? N    Patient Care Team: Rubie Maid, FNP as PCP - General (Family Medicine) Harl Bowie Alphonse Guild, MD as PCP - Cardiology (Cardiology)  Indicate any recent Medical Services you may have received from other than [facility] providers in the past year (date may be approximate).     Assessment:   This is a routine wellness examination for [place].  Hearing/Vision screen Hearing Screening - Comments:: No hearing issues.  Vision Screening - Comments:: Glasses. No Optometrist.   Dietary issues and exercise activities discussed: Current Exercise Habits: Home exercise routine, Type of exercise: walking;stretching, Time (Minutes): 30, Frequency (Times/Week): 5, Weekly Exercise (Minutes/Week): 150,  Intensity: Mild, Exercise limited by: cardiac condition(s);orthopedic condition(s)   Goals Addressed             This Visit's Progress    DIET - REDUCE CALORIE INTAKE       Pt would like to reduce cholesterol.       Depression Screen    04/03/2022    2:03 PM 11/09/2021    9:16 AM 04/26/2020   12:05 PM 02/09/2020   11:26 AM 08/10/2019   10:44 AM 01/08/2019   11:13 AM 08/05/2018    3:03 PM  PHQ 2/9 Scores  PHQ - 2 Score 0 0 0 0 0 0 0  PHQ- 9 Score  0   2      Fall Risk    04/03/2022    2:12 PM 11/09/2021    9:04 AM 11/09/2021    8:57 AM 04/26/2020   12:05 PM 02/09/2020   11:25 AM  [place] in the past year? 0 1 0 1 1  Number falls in past yr: 0 0 0 1 0  Injury with Fall? 0 1 0 0 1  Comment  feels sore     Risk for fall due to : No Fall Risks      Follow up Falls prevention discussed   Falls evaluation completed Falls evaluation completed    [place]:  Any stairs in or around the home? Yes  If so, are there any without handrails? No  Home free of loose throw rugs in walkways, pet beds, electrical cords, etc? Yes  Adequate lighting in your home to reduce risk of falls? Yes   ASSISTIVE DEVICES UTILIZED TO PREVENT FALLS:  Life alert? No  Use of a cane, walker or w/c? Yes  Grab bars in the bathroom? No  Shower chair or bench in shower? No  Elevated toilet seat or a handicapped toilet? No   TIMED UP AND GO:  Was the test performed? No .  PHONE VISIT  Cognitive Function:        04/03/2022    2:15 PM  6CIT Screen  What Year? 0 points  What month? 0 points  What time? 0 points  Count back from 20 0 points  Months in reverse 0 points  Repeat phrase 0 points  Total Score 0 points    Immunizations Immunization History  Administered Date(s) Administered   Fluad Quad(high Dose 65+) 12/03/2018   H1N1 01/10/2008   Influenza, High Dose Seasonal PF 12/03/2017, 11/09/2019   Influenza,inj,Quad PF,6+ Mos 01/05/2013,  01/06/2014, 11/14/2014   Influenza-Unspecified 12/27/2010, 12/20/2015, 12/02/2021   Moderna Sars-Covid-2 Vaccination 11/09/2019, 12/09/2019   Pneumococcal Conjugate-13 01/18/2013   Pneumococcal Polysaccharide-23 12/16/2005, 05/05/2017   Td 09/26/2011   Tdap 09/26/2011    TDAP status: Due, Education has been provided regarding the importance of this vaccine. Advised may receive this vaccine at local pharmacy or Health Dept. Aware to provide a copy of the vaccination record if obtained from local pharmacy or Health Dept. Verbalized acceptance and understanding.  Flu Vaccine status: Up to date  Pneumococcal vaccine status: Up to date  Covid-19 vaccine status: Completed vaccines  Qualifies for Shingles Vaccine? Yes   Zostavax completed No   Shingrix Completed?: No.    Education has been provided regarding the importance of this vaccine. Patient has been advised to call insurance company to determine out of pocket expense if they have not yet received this vaccine. Advised may also receive vaccine at local pharmacy or Health Dept. Verbalized acceptance and understanding.  Screening Tests Health Maintenance  Topic Date Due   Zoster Vaccines- Shingrix (1 of 2) Never done   COVID-19 Vaccine (3 - Moderna risk series) 01/06/2020   DTaP/Tdap/Td (3 - Td or Tdap) 09/25/2021   Fecal DNA (Cologuard)  09/07/2022   Medicare Annual Wellness (AWV)  04/04/2023   MAMMOGRAM  05/17/2023   Pneumonia Vaccine 58+ Years old  Completed   INFLUENZA VACCINE  Completed   DEXA SCAN  Completed   Hepatitis C Screening  Completed   HPV VACCINES  Aged Out    Health Maintenance  Health Maintenance Due  Topic Date Due   Zoster Vaccines- Shingrix (1 of 2) Never done   COVID-19 Vaccine (3 - Moderna risk series) 01/06/2020   DTaP/Tdap/Td (3 - Td or Tdap) 09/25/2021    Colorectal cancer screening: Type of screening: Cologuard. Completed 09/07/2019. Repeat every 3 years  Mammogram status: Completed 05/16/2021.  Repeat every year  Bone Density status: Completed 08/13/2019. Results reflect: Bone density results: OSTEOPOROSIS. Repeat every 2 years.  Lung Cancer Screening: (Low Dose CT Chest recommended if Age 48-80 years, 30 pack-year currently smoking OR have quit w/in 15years.) does not qualify.   Lung Cancer Screening Referral: N/A  Additional Screening:  Hepatitis C Screening: does qualify; Completed 03/08/2015  Vision Screening: Recommended annual ophthalmology exams for early detection of glaucoma and other disorders of the eye. Is the patient up to date with their annual eye exam?  No  Who is the provider or what is the name of the office in which the patient attends annual eye exams? Pt states she is going to call and schedule with [facility] in [place]. If pt is not established with a provider, would they like to be referred to a provider to establish care? No .   Dental Screening: Recommended annual dental exams for proper oral hygiene  Community Resource Referral / Chronic Care Management: CRR required this visit?  No   CCM required this visit?  No      Plan:     I have personally reviewed and noted the following in the patient's chart:   Medical and social history Use of alcohol, tobacco or illicit drugs  Current medications  and supplements including opioid prescriptions. Patient is currently taking opioid prescriptions. Information provided to patient regarding non-opioid alternatives. Patient advised to discuss non-opioid treatment plan with their provider. Functional ability and status Nutritional status Physical activity Advanced directives List of other physicians Hospitalizations, surgeries, and ER visits in previous 12 months Vitals Screenings to include cognitive, depression, and falls Referrals and appointments  In addition, I have reviewed and discussed with patient certain preventive protocols, quality metrics, and best practice recommendations. A written  personalized care plan for preventive services as well as general preventive health recommendations were provided to patient.     Chriss Driver, LPN   [id number]   Nurse Notes: Discussed Shingrix and Tdap and how to obtain. Pt advised cologuard and mammogram due this year. Orders placed.

## 2022-04-03 NOTE — Telephone Encounter (Signed)
During AWV pt states she usually takes a medication that starts with a "U" to help with her cystitis. I think she is referring to [place]. Pt states it, "makes her urine blue." Pt asks if a refill can be sent in for her? Thank you.

## 2022-06-05 ENCOUNTER — Ambulatory Visit (INDEPENDENT_AMBULATORY_CARE_PROVIDER_SITE_OTHER): Payer: BC Managed Care – PPO | Admitting: Family Medicine

## 2022-06-05 ENCOUNTER — Ambulatory Visit (HOSPITAL_COMMUNITY)
Admission: RE | Admit: 2022-06-05 | Discharge: 2022-06-05 | Disposition: A | Discharge status: Home / Self Care | Payer: BC Managed Care – PPO | Source: Ambulatory Visit | Attending: Family Medicine | Admitting: Family Medicine

## 2022-06-05 ENCOUNTER — Encounter (HOSPITAL_COMMUNITY): Payer: Self-pay

## 2022-06-05 VITALS — BP 110/72 | HR 70 | Temp 98.1°F | Ht 68.0 in | Wt 161.4 lb

## 2022-06-05 DIAGNOSIS — J45909 Unspecified asthma, uncomplicated: Secondary | ICD-10-CM | POA: Diagnosis not present

## 2022-06-05 DIAGNOSIS — R220 Localized swelling, mass and lump, head: Secondary | ICD-10-CM

## 2022-06-05 DIAGNOSIS — L0291 Cutaneous abscess, unspecified: Secondary | ICD-10-CM | POA: Insufficient documentation

## 2022-06-05 DIAGNOSIS — Z1231 Encounter for screening mammogram for malignant neoplasm of breast: Secondary | ICD-10-CM | POA: Diagnosis not present

## 2022-06-05 DIAGNOSIS — Z1211 Encounter for screening for malignant neoplasm of colon: Secondary | ICD-10-CM

## 2022-06-05 MED ORDER — ALBUTEROL SULFATE HFA 108 (90 BASE) MCG/ACT IN AERS: INHALATION_SPRAY | RESPIRATORY_TRACT | 3 refills | Status: DC

## 2022-06-05 MED ORDER — FLUTICASONE-SALMETEROL 250-50 MCG/ACT IN AEPB: INHALATION_SPRAY | RESPIRATORY_TRACT | 3 refills | Status: DC

## 2022-06-05 NOTE — Assessment & Plan Note (Signed)
Patient presents today with what appears to be an abscess to her left cheek, it is erythematous, tender, and fluctuant with reported purulent drainage. She has an upcoming appointment with dermatology, will call and try for a sooner appointment and place urgent referral back. I encouraged her to use warm compress and allow it to drain. Return to office if symptoms worsen or if unable to get sooner appt with dermatology.

## 2022-06-05 NOTE — Progress Notes (Unsigned)
Acute Office Visit  Subjective:     Patient ID: Jennifer Hays, female    DOB: [birth year], 77 y.o.   MRN: [id number]  Chief Complaint  Patient presents with   Knot on face    Patient has large bump on the side of the nose on the left side of the face. Started in March and started oozing yesterday.    HPI Patient is in today for large bump on the left side of her nose that is growing since February and has been draining since yesterday. It is tender.  She denies fever, chills, malaise. She did recently have a skin cancer removed from her forehead and has a dermatology appointment on 4/11.   Review of Systems  All other systems reviewed and are negative.       Objective:    BP 110/72   Pulse 70   Temp 98.1 F (36.7 C) (Oral)   Ht 5\' 8"  (1.727 m)   Wt 161 lb 6.4 oz (73.2 kg)   SpO2 97%   BMI 24.54 kg/m  {Vitals History (Optional):23777}  Physical Exam Vitals and nursing note reviewed.  Constitutional:      Appearance: Normal appearance. She is normal weight.  HENT:     Head: Normocephalic and atraumatic.  Skin:    General: Skin is warm and dry.     Findings: Abscess present.          Comments: 0.5 x 0.25in fluctuant, tender mass  Neurological:     General: No focal deficit present.     Mental Status: She is alert and oriented to person, place, and time. Mental status is at baseline.  Psychiatric:        Mood and Affect: Mood normal.        Behavior: Behavior normal.        Thought Content: Thought content normal.        Judgment: Judgment normal.     No results found for any visits on 06/05/22.      Assessment & Plan:   Problem List Items Addressed This Visit       Respiratory   Intrinsic asthma   Relevant Medications   albuterol (VENTOLIN HFA) 108 (90 Base) MCG/ACT inhaler   fluticasone-salmeterol (WIXELA INHUB) 250-50 MCG/ACT AEPB     Other   Colon cancer screening - Primary   Relevant Orders   Cologuard   Abscess    Patient presents today  with what appears to be an abscess to her left cheek, it is erythematous, tender, and fluctuant with reported purulent drainage. She has an upcoming appointment with dermatology, will call and try for a sooner appointment and place urgent referral back. I encouraged her to use warm compress and allow it to drain. Return to office if symptoms worsen or if unable to get sooner appt with dermatology.      Relevant Orders   Ambulatory referral to Dermatology    Meds ordered this encounter  Medications   albuterol (VENTOLIN HFA) 108 (90 Base) MCG/ACT inhaler    Sig: TAKE 2 PUFFS BY MOUTH EVERY 6 HOURS AS NEEDED FOR WHEEZE OR SHORTNESS OF BREATH    Dispense:  8.5 each    Refill:  3   fluticasone-salmeterol (WIXELA INHUB) 250-50 MCG/ACT AEPB    Sig: INHALE 1 PUFF INTO THE LUNGS TWICE A DAY    Dispense:  60 each    Refill:  3    No further refills to be given. Patient must  establish with new PCP.    Return if symptoms worsen or fail to improve.  Rubie Maid, FNP

## 2022-06-06 DIAGNOSIS — R220 Localized swelling, mass and lump, head: Secondary | ICD-10-CM | POA: Insufficient documentation

## 2022-06-06 NOTE — Assessment & Plan Note (Signed)
Patient presents today with what appears to be an abscess to her left cheek, it is erythematous, tender, and fluctuant with reported purulent drainage, no drainage on exam today. Given the location of this I will refer back to dermatology for further management. She has an upcoming appointment with dermatology, will call and try for a sooner appointment and place urgent referral back. I encouraged her to use warm compress and allow it to drain. Return to office if symptoms worsen or if unable to get sooner appt with dermatology.

## 2022-06-13 DIAGNOSIS — L720 Epidermal cyst: Secondary | ICD-10-CM | POA: Diagnosis not present

## 2022-06-13 DIAGNOSIS — Z85828 Personal history of other malignant neoplasm of skin: Secondary | ICD-10-CM | POA: Diagnosis not present

## 2022-06-13 DIAGNOSIS — Z08 Encounter for follow-up examination after completed treatment for malignant neoplasm: Secondary | ICD-10-CM | POA: Diagnosis not present

## 2022-06-21 ENCOUNTER — Encounter: Payer: Self-pay | Admitting: Family Medicine

## 2022-06-21 ENCOUNTER — Ambulatory Visit (INDEPENDENT_AMBULATORY_CARE_PROVIDER_SITE_OTHER): Payer: BC Managed Care – PPO | Admitting: Family Medicine

## 2022-06-21 VITALS — BP 120/82 | HR 84 | Temp 98.8°F | Ht 68.0 in | Wt 161.0 lb

## 2022-06-21 DIAGNOSIS — J45901 Unspecified asthma with (acute) exacerbation: Secondary | ICD-10-CM | POA: Diagnosis not present

## 2022-06-21 DIAGNOSIS — J302 Other seasonal allergic rhinitis: Secondary | ICD-10-CM

## 2022-06-21 DIAGNOSIS — J385 Laryngeal spasm: Secondary | ICD-10-CM | POA: Diagnosis not present

## 2022-06-21 DIAGNOSIS — J029 Acute pharyngitis, unspecified: Secondary | ICD-10-CM

## 2022-06-21 DIAGNOSIS — R0602 Shortness of breath: Secondary | ICD-10-CM | POA: Diagnosis not present

## 2022-06-21 MED ORDER — ALBUTEROL SULFATE (2.5 MG/3ML) 0.083% IN NEBU
2.5000 mg | INHALATION_SOLUTION | Freq: Four times a day (QID) | RESPIRATORY_TRACT | 1 refills | Status: DC | PRN
Start: 2022-06-21 — End: 2023-04-07

## 2022-06-21 MED ORDER — PREDNISONE 20 MG PO TABS
ORAL_TABLET | ORAL | 0 refills | Status: DC
Start: 2022-06-21 — End: 2022-12-11

## 2022-06-21 NOTE — Progress Notes (Addendum)
Subjective:    Patient ID: Jennifer Hays, female    DOB: 28-Apr-1945, 77 y.o.   MRN: [id number]  Shortness of Breath   Patient has a history of allergies and asthma.  She is not on currently any preventative inhaler.  She states that recently she has been having lots of sinusitis and drainage and postnasal drip causing a severe sore throat.  Today on exam the throat is not erythematous and her strep test is negative however she is audibly congested.  She also reports coughing wheezing and short of breath.  When I walling in bed she had what sounds like laryngospasm.  She states that she could not breathe for few minutes.  However that suddenly passed.  She felt like her throat was getting tight.  She has not been using her nebulizer treatment. Past Medical History:  Diagnosis Date   Allergy    Asthma    Atrophic vaginitis    Osteopenia    Past Surgical History:  Procedure Laterality Date   ABDOMINAL HYSTERECTOMY     KNEE SURGERY     Right   SKIN CANCER DESTRUCTION N/A 02/14/2022   Dr. Marliss Coots   SPINE SURGERY     Current Outpatient Medications on File Prior to Visit  Medication Sig Dispense Refill   albuterol (VENTOLIN HFA) 108 (90 Base) MCG/ACT inhaler TAKE 2 PUFFS BY MOUTH EVERY 6 HOURS AS NEEDED FOR WHEEZE OR SHORTNESS OF BREATH 8.5 each 3   alendronate (FOSAMAX) 70 MG tablet Take with a full glass of water on an empty stomach. 12 tablet 1   Ascorbic Acid (VITAMIN C) 1000 MG tablet Take 500 mg by mouth daily.      budesonide (PULMICORT) 1 MG/2ML nebulizer solution Take 2 mLs (1 mg total) by nebulization daily. 120 mL 3   Cholecalciferol (VITAMIN D) 125 MCG (5000 UT) CAPS Take 1 capsule daily 30 capsule 0   ezetimibe (ZETIA) 10 MG tablet Take 1 tablet (10 mg total) by mouth daily. (Patient not taking: Reported on 06/05/2022) 90 tablet 3   fluticasone-salmeterol (WIXELA INHUB) 250-50 MCG/ACT AEPB INHALE 1 PUFF INTO THE LUNGS TWICE A DAY 60 each 3   HYDROcodone-acetaminophen  (NORCO/VICODIN) 5-325 MG per tablet Take 1 tablet by mouth every 6 (six) hours as needed for moderate pain.     lidocaine (LINDAMANTLE) 3 % CREA cream SMARTSIG:Sparingly Topical Daily     loratadine (CLARITIN) 10 MG tablet TAKE 1 TABLET BY MOUTH EVERY DAY 30 tablet 0   Meth-Hyo-M Bl-Na Phos-Ph Sal (URO-MP) 118 MG CAPS TAKE 2 CAPSULES TWICE A DAY AS NEEDED CYSTITIS 60 capsule 1   Multiple Vitamin (MULTIVITAMIN) tablet Take 1 tablet by mouth daily.     sodium chloride (OCEAN) 0.65 % SOLN nasal spray Place 1 spray into both nostrils as needed for congestion.     tizanidine (ZANAFLEX) 2 MG capsule Take 2 mg by mouth 3 (three) times daily.     No current facility-administered medications on file prior to visit.   Allergies  Allergen Reactions   Clindamycin/Lincomycin     Itching    Penicillins     REACTION: rash, itching, difficulty breathing   Statins    Social History   Socioeconomic History   Marital status: Married    Spouse name: Dorinda Hill   Number of children: Not on file   Years of education: Not on file   Highest education level: Not on file  Occupational History   Not on file  Tobacco Use  Smoking status: Former   Smokeless tobacco: Never   Tobacco comments:    quit 1970's  Vaping Use   Vaping Use: Never used  Substance and Sexual Activity   Alcohol use: No    Alcohol/week: 0.0 standard drinks of alcohol   Drug use: No   Sexual activity: Yes  Other Topics Concern   Not on file  Social History Narrative   Retired Runner, broadcasting/film/video.   Social Determinants of Health   Financial Resource Strain: Not on file  Food Insecurity: No Food Insecurity (04/03/2022)   Hunger Vital Sign    Worried About Running Out of Food in the Last Year: Never true    Ran Out of Food in the Last Year: Never true  Transportation Needs: No Transportation Needs (04/03/2022)   PRAPARE - Administrator, Civil Service (Medical): No    Lack of Transportation (Non-Medical): No  Physical Activity:  Sufficiently Active (04/03/2022)   Exercise Vital Sign    Days of Exercise per Week: 5 days    Minutes of Exercise per Session: 30 min  Stress: No Stress Concern Present (04/03/2022)   [employer] of Occupational Health - Occupational Stress Questionnaire    Feeling of Stress : Not at all  Social Connections: Socially Integrated (04/03/2022)   Social Connection and Isolation Panel [NHANES]    Frequency of Communication with Friends and Family: More than three times a week    Frequency of Social Gatherings with Friends and Family: More than three times a week    Attends Religious Services: More than 4 times per year    Active Member of [employer] or Organizations: Yes    Attends Engineer, structural: More than 4 times per year    Marital Status: Married  Catering manager Violence: Not At Risk (04/03/2022)   Humiliation, Afraid, Rape, and Kick questionnaire    Fear of Current or Ex-Partner: No    Emotionally Abused: No    Physically Abused: No    Sexually Abused: No     Review of Systems  Respiratory:  Positive for shortness of breath.        Objective:   Physical Exam Vitals reviewed.  Constitutional:      General: She is not in acute distress.    Appearance: She is well-developed and normal weight. She is not ill-appearing or toxic-appearing.  HENT:     Mouth/Throat:     Mouth: Mucous membranes are moist.     Pharynx: Oropharynx is clear. No pharyngeal swelling or oropharyngeal exudate.  Cardiovascular:     Rate and Rhythm: Normal rate and regular rhythm.  Pulmonary:     Effort: Pulmonary effort is normal. No accessory muscle usage or respiratory distress.     Breath sounds: No stridor. Examination of the right-upper field reveals wheezing. Examination of the left-upper field reveals wheezing. Examination of the right-lower field reveals wheezing. Examination of the left-lower field reveals wheezing. Decreased breath sounds and wheezing present. No rhonchi or rales.   Chest:     Chest wall: No tenderness.  Musculoskeletal:     Right lower leg: No edema.     Left lower leg: No edema.  Lymphadenopathy:     Cervical: No cervical adenopathy.  Neurological:     Mental Status: She is alert.           Assessment & Plan:  Sore throat - Plan: STREP GROUP A AG, W/REFLEX TO CULT  Shortness of breath - Plan: EKG 12-Lead  Laryngospasm  Seasonal allergic rhinitis, unspecified trigger  Exacerbation of asthma, unspecified asthma severity, unspecified whether persistent I believe seasonal allergies are causing head congestion, postnasal drip, and drainage and this is the source of the patient's sore throat.  Her throat looks normal on exam today.  There is no lymphadenopathy.  Her strep test is negative.  I believe that the postnasal drip and allergies have likely triggered an asthma exacerbation and this is causing the shortness of breath and trouble breathing and wheezing.  Therefore I recommended a prednisone taper pack to address both allergies and asthma attack along with albuterol 2.5 mg nebs every 6 hours as needed for wheezing.  I believe last night she had a laryngospasm possibly due to drainage.  Recheck next week if no better or sooner if worsening. Her EKG looks normal today.  She is in normal sinus rhythm with normal intervals and normal axis with no evidence of ischemia or infarction.

## 2022-06-23 LAB — CULTURE, GROUP A STREP
MICRO NUMBER:: 14848626
SPECIMEN QUALITY:: ADEQUATE

## 2022-06-23 LAB — STREP GROUP A AG, W/REFLEX TO CULT: Streptococcus Group A AG: NOT DETECTED

## 2022-07-04 DIAGNOSIS — L720 Epidermal cyst: Secondary | ICD-10-CM | POA: Diagnosis not present

## 2022-08-15 DIAGNOSIS — L720 Epidermal cyst: Secondary | ICD-10-CM | POA: Diagnosis not present

## 2022-09-02 LAB — COLOGUARD: Cologuard: NEGATIVE

## 2022-09-09 LAB — COLOGUARD: COLOGUARD: NEGATIVE

## 2022-09-10 ENCOUNTER — Encounter: Payer: Self-pay | Admitting: Family Medicine

## 2022-10-15 IMAGING — MG MM DIGITAL SCREENING BILAT W/ TOMO AND CAD
8 series · 9 of 24 positions shown · non-contrast
Comparison: Previous exam(s).

CLINICAL DATA: Screening.

EXAM:
DIGITAL SCREENING BILATERAL MAMMOGRAM WITH TOMOSYNTHESIS AND CAD
TECHNIQUE: Bilateral screening digital craniocaudal and mediolateral oblique
mammograms were obtained. Bilateral screening digital breast
tomosynthesis was performed. The images were evaluated with
computer-aided detection.

[L CC synth-2D]
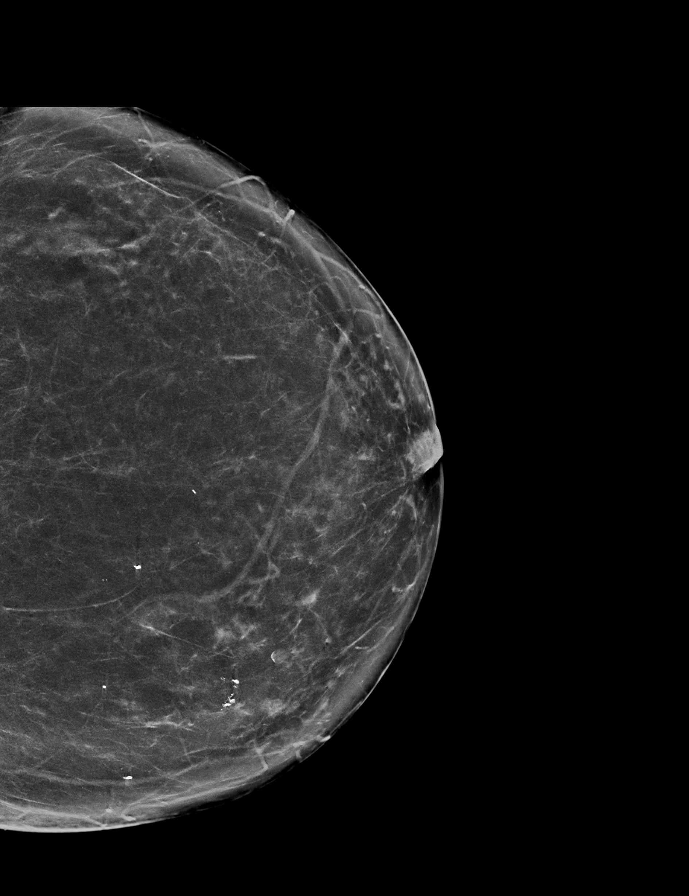

[R MLO synth-2D]
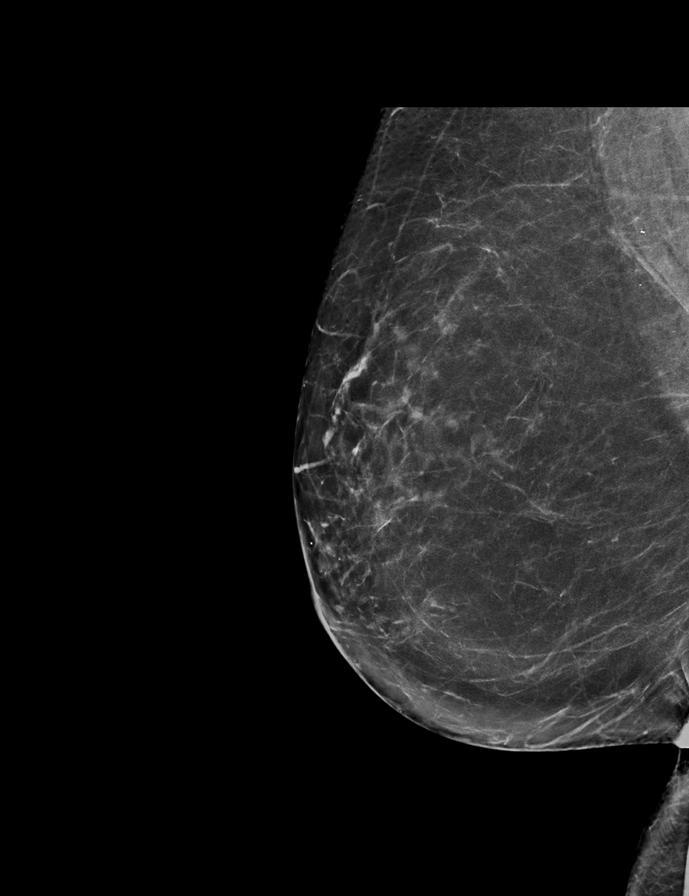

[R CC synth-2D]
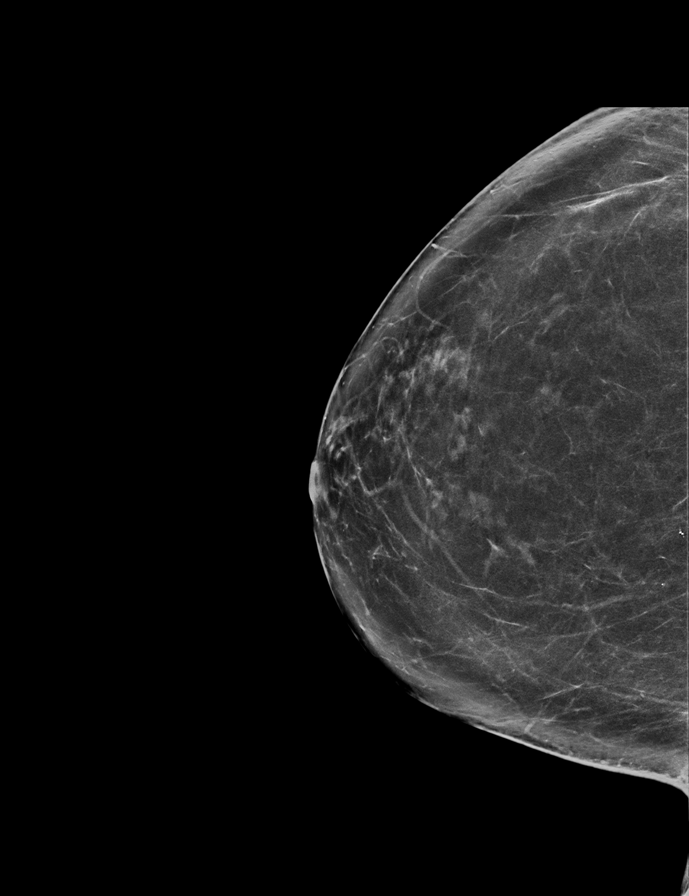

[L MLO synth-2D]
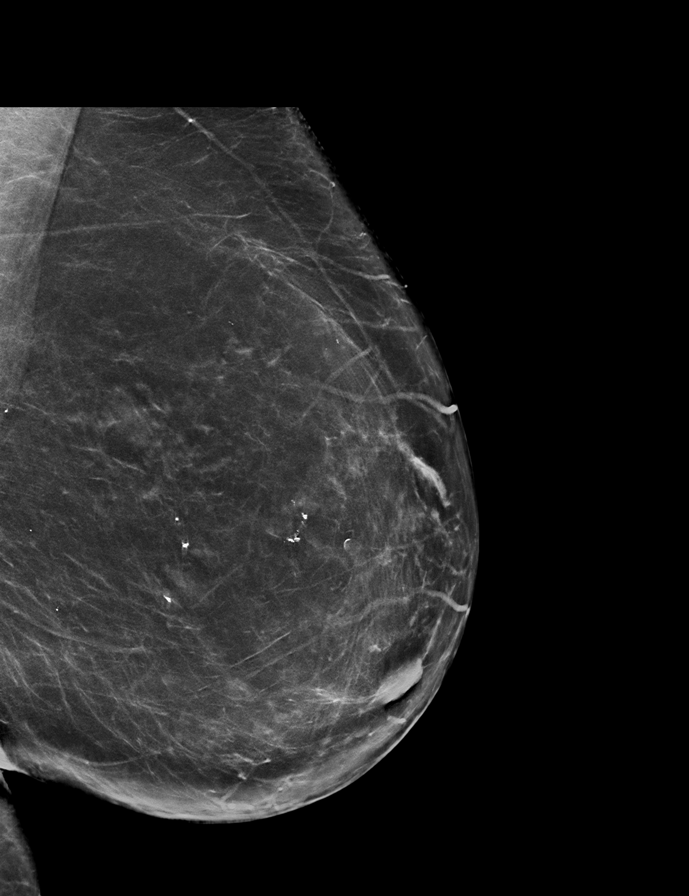

[R CC tomo · 2 of 70 frames shown]
[frame 23/70]
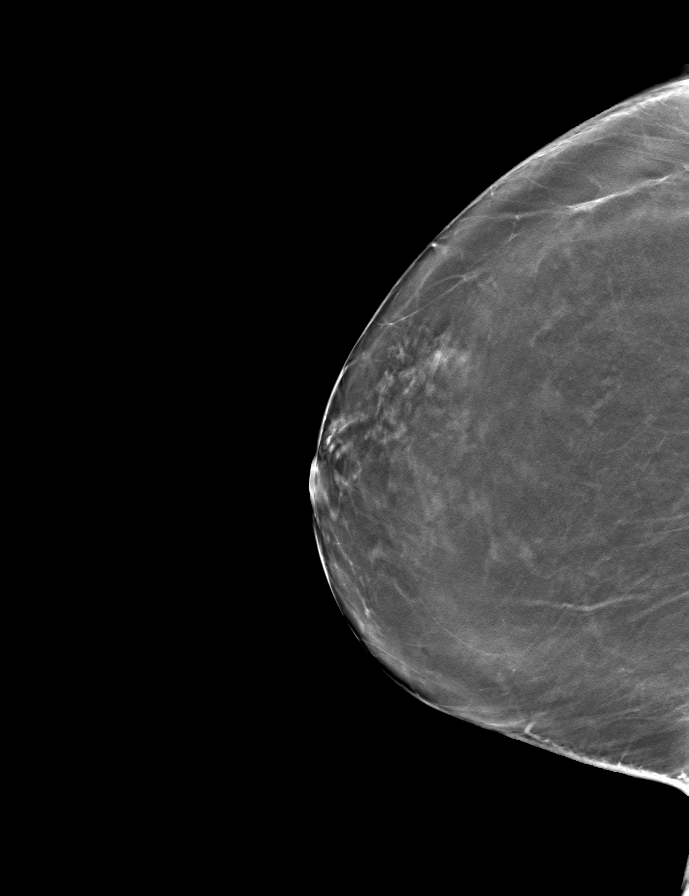
[frame 35/70]
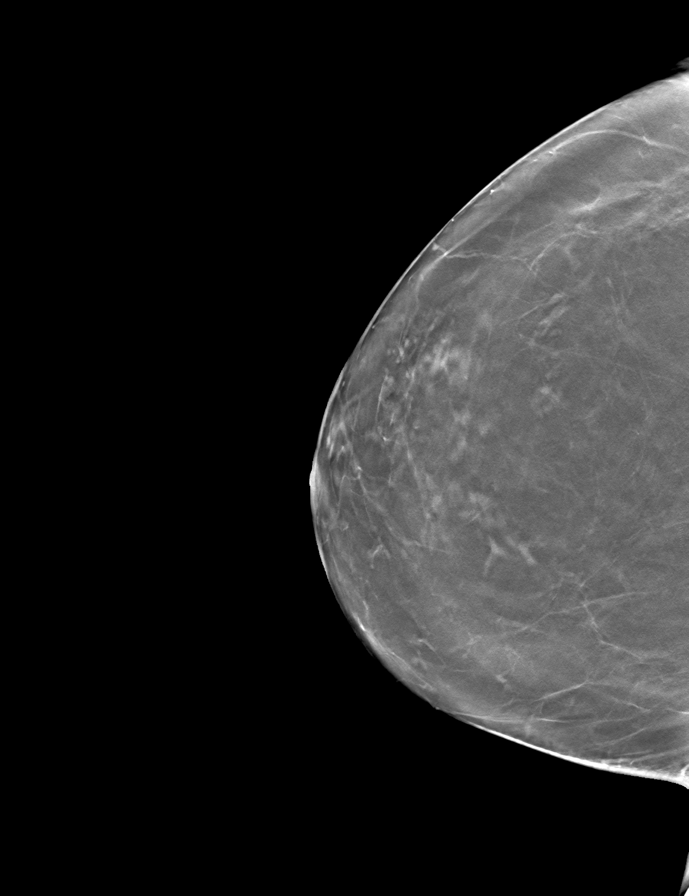

[L MLO tomo · tomo slice 39/77.0]
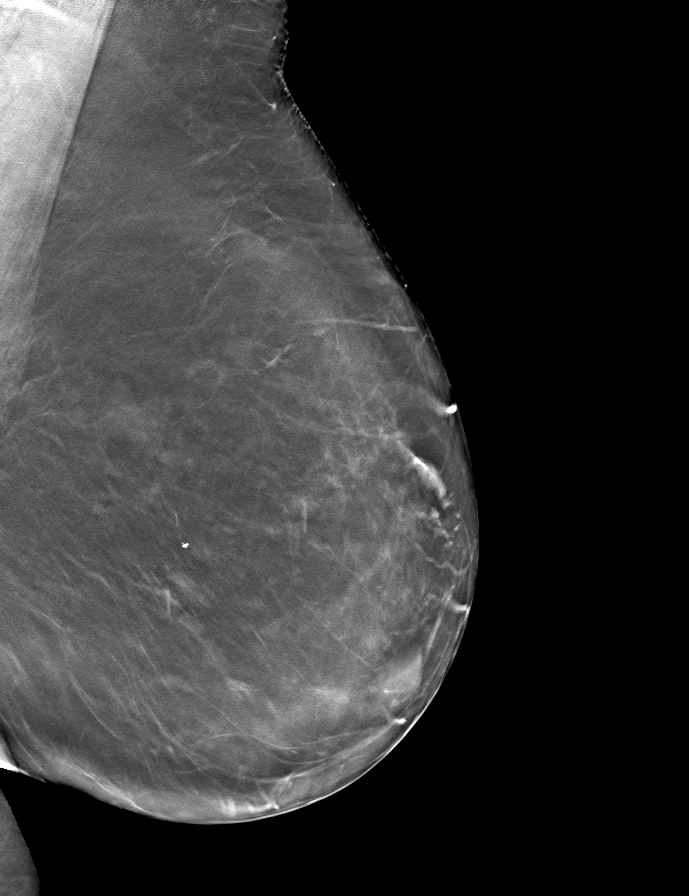

[L CC tomo · tomo slice 35/69.0]
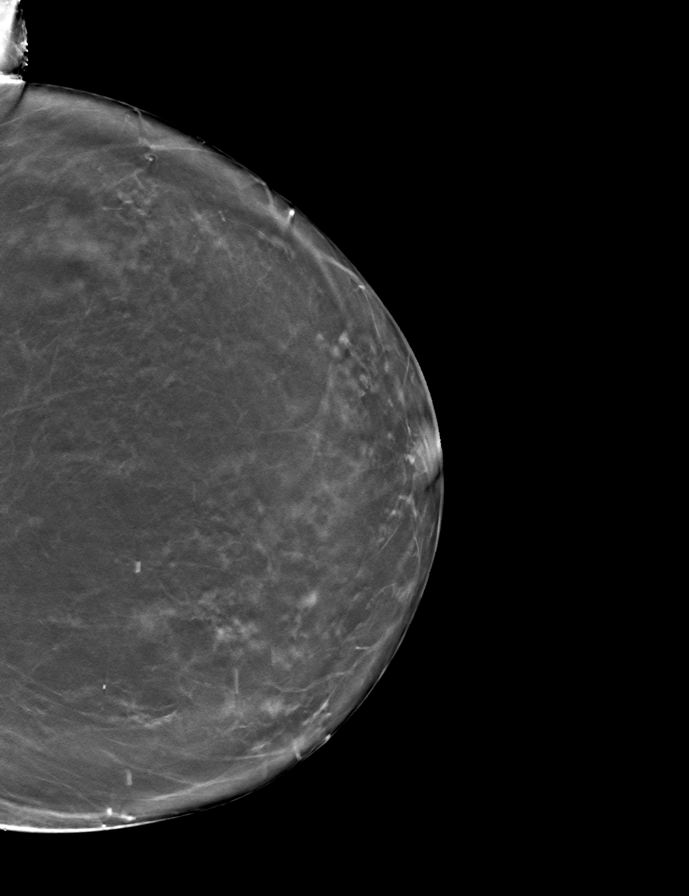

[R MLO tomo · tomo slice 34/67.0]
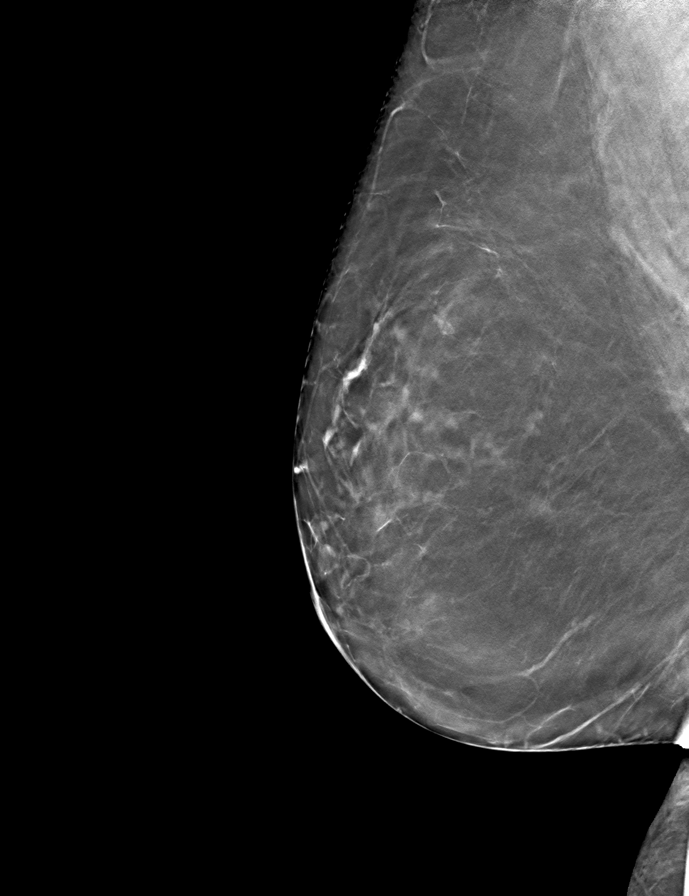

[9 of 24 positions shown; findings below may reference images not displayed]

ACR Breast Density Category b: There are scattered areas of
fibroglandular density.
FINDINGS: There are no findings suspicious for malignancy.
IMPRESSION: No mammographic evidence of malignancy. A result letter of this
screening mammogram will be mailed directly to the patient.

RECOMMENDATION:
Screening mammogram in one year. (Code:51-O-LD2)

BI-RADS CATEGORY  1: Negative.

## 2022-10-19 ENCOUNTER — Other Ambulatory Visit: Payer: Self-pay | Admitting: Family Medicine

## 2022-12-04 ENCOUNTER — Encounter: Payer: Self-pay | Admitting: Family Medicine

## 2022-12-04 ENCOUNTER — Ambulatory Visit (INDEPENDENT_AMBULATORY_CARE_PROVIDER_SITE_OTHER): Payer: BC Managed Care – PPO | Admitting: Family Medicine

## 2022-12-04 VITALS — BP 120/70 | HR 67 | Temp 97.7°F | Ht 68.0 in | Wt 162.0 lb

## 2022-12-04 DIAGNOSIS — N3010 Interstitial cystitis (chronic) without hematuria: Secondary | ICD-10-CM

## 2022-12-04 DIAGNOSIS — M797 Fibromyalgia: Secondary | ICD-10-CM | POA: Diagnosis not present

## 2022-12-04 DIAGNOSIS — E782 Mixed hyperlipidemia: Secondary | ICD-10-CM

## 2022-12-04 DIAGNOSIS — Z130 Encounter for screening for diseases of the blood and blood-forming organs and certain disorders involving the immune mechanism: Secondary | ICD-10-CM

## 2022-12-04 DIAGNOSIS — M816 Localized osteoporosis [Lequesne]: Secondary | ICD-10-CM | POA: Diagnosis not present

## 2022-12-04 DIAGNOSIS — E559 Vitamin D deficiency, unspecified: Secondary | ICD-10-CM | POA: Diagnosis not present

## 2022-12-04 MED ORDER — TIZANIDINE HCL 2 MG PO CAPS: 2.0000 mg | ORAL_CAPSULE | Freq: Three times a day (TID) | ORAL | 0 refills | Status: DC | PRN

## 2022-12-04 NOTE — Progress Notes (Signed)
Subjective:  HPI: Jennifer Hays is a 77 y.o. female presenting on 12/04/2022 for Acute Visit (recent fall/ f/u on staph infection; med refills - J[facility]\\\//)   HPI Patient is in today for medication refills. She also reports recent fall resulting in "bumps and bruises" due to her right hip SI joint instability, her last fall was August 21. She reports her home is free of rugs and cords, has good lighting, and she handrails on her steps. She also walks with a cane. She reports having been treated by Dermatology for a staph infection on her face in the spring and was treated with 3 rounds of Doxycycline. She requests a refill for Uro-MP for interstitial cystitis. She takes this rarely but does need it when she eats an offending food and feels bladder spasms. We discussed risks of this medication and that it is discouraged in patients >65 and with recent falls. She also takes Tizanidine a few times a month for muscle spasms in her shoulders and requests a refill of this, we also discussed the risks of this medication in elderly and encouraged her to take this at night and use caution with risk of falls. She has been out of her Zetia and Fosamax for several months.  Review of Systems  Respiratory: Negative.    Cardiovascular: Negative.   Neurological: Negative.   Psychiatric/Behavioral: Negative.    All other systems reviewed and are negative.   Relevant past medical history reviewed and updated as indicated.   Past Medical History:  Diagnosis Date   Allergy    Asthma    Atrophic vaginitis    Osteopenia      Past Surgical History:  Procedure Laterality Date   ABDOMINAL HYSTERECTOMY     KNEE SURGERY     Right   SKIN CANCER DESTRUCTION N/A 02/14/2022   Dr. Marliss Coots   SPINE SURGERY      Allergies and medications reviewed and updated.   Current Outpatient Medications:    albuterol (PROVENTIL) (2.5 MG/3ML) 0.083% nebulizer solution, Take 3 mLs (2.5 mg total) by nebulization every 6  (six) hours as needed for wheezing or shortness of breath., Disp: 150 mL, Rfl: 1   albuterol (VENTOLIN HFA) 108 (90 Base) MCG/ACT inhaler, TAKE 2 PUFFS BY MOUTH EVERY 6 HOURS AS NEEDED FOR WHEEZE OR SHORTNESS OF BREATH, Disp: 8.5 each, Rfl: 3   alendronate (FOSAMAX) 70 MG tablet, Take with a full glass of water on an empty stomach., Disp: 12 tablet, Rfl: 1   Ascorbic Acid (VITAMIN C) 1000 MG tablet, Take 500 mg by mouth daily. , Disp: , Rfl:    Cholecalciferol (VITAMIN D) 125 MCG (5000 UT) CAPS, Take 1 capsule daily, Disp: 30 capsule, Rfl: 0   ezetimibe (ZETIA) 10 MG tablet, Take 1 tablet (10 mg total) by mouth daily., Disp: 90 tablet, Rfl: 3   HYDROcodone-acetaminophen (NORCO/VICODIN) 5-325 MG per tablet, Take 1 tablet by mouth every 6 (six) hours as needed for moderate pain., Disp: , Rfl:    lidocaine (LINDAMANTLE) 3 % CREA cream, SMARTSIG:Sparingly Topical Daily, Disp: , Rfl:    loratadine (CLARITIN) 10 MG tablet, TAKE 1 TABLET BY MOUTH EVERY DAY, Disp: 30 tablet, Rfl: 0   Meth-Hyo-M Bl-Na Phos-Ph Sal (URO-MP) 118 MG CAPS, TAKE 2 CAPSULES TWICE A DAY AS NEEDED CYSTITIS, Disp: 60 capsule, Rfl: 1   Multiple Vitamin (MULTIVITAMIN) tablet, Take 1 tablet by mouth daily., Disp: , Rfl:    sodium chloride (OCEAN) 0.65 % SOLN nasal spray, Place 1  spray into both nostrils as needed for congestion., Disp: , Rfl:    WIXELA INHUB 250-50 MCG/ACT AEPB, INHALE 1 PUFF INTO THE LUNGS TWICE A DAY, Disp: 60 each, Rfl: 3   predniSONE (DELTASONE) 20 MG tablet, 3 tabs poqday 1-2, 2 tabs poqday 3-4, 1 tab poqday 5-6 (Patient not taking: Reported on 12/04/2022), Disp: 12 tablet, Rfl: 0   tizanidine (ZANAFLEX) 2 MG capsule, Take 1 capsule (2 mg total) by mouth 3 (three) times daily as needed for muscle spasms., Disp: 30 capsule, Rfl: 0  Allergies  Allergen Reactions   Clindamycin/Lincomycin     Itching    Penicillins     REACTION: rash, itching, difficulty breathing   Statins     Objective:   BP 120/70   Pulse  67   Temp 97.7 F (36.5 C) (Oral)   Ht 5\' 8"  (1.727 m)   Wt 162 lb (73.5 kg)   SpO2 94%   BMI 24.63 kg/m      12/04/2022   11:07 AM 06/21/2022    9:53 AM 06/05/2022    2:30 PM  Vitals with BMI  Height 5\' 8"  5\' 8"  5\' 8"   Weight 162 lbs 161 lbs 161 lbs 6 oz  BMI 24.64 24.49 24.55  Systolic 120 120 811  Diastolic 70 82 72  Pulse 67 84 70     Physical Exam Vitals and nursing note reviewed.  Constitutional:      Appearance: Normal appearance. She is normal weight.  HENT:     Head: Normocephalic and atraumatic.  Cardiovascular:     Rate and Rhythm: Normal rate and regular rhythm.     Pulses: Normal pulses.     Heart sounds: Normal heart sounds.  Pulmonary:     Effort: Pulmonary effort is normal.     Breath sounds: Normal breath sounds.  Skin:    General: Skin is warm and dry.  Neurological:     General: No focal deficit present.     Mental Status: She is alert and oriented to person, place, and time. Mental status is at baseline.  Psychiatric:        Mood and Affect: Mood normal.        Behavior: Behavior normal.        Thought Content: Thought content normal.        Judgment: Judgment normal.     Assessment & Plan:  IC (interstitial cystitis) Assessment & Plan: Will defer UroMD and refer to urology.  Orders: -     Ambulatory referral to Urology  Vitamin D deficiency -     VITAMIN D 25 Hydroxy (Vit-D Deficiency, Fractures)  Mixed hyperlipidemia Assessment & Plan: Fasting labs today. Will restart Zetia if indicated. She is interested in decreased number of medications she is taking.  Orders: -     COMPLETE METABOLIC PANEL WITH GFR -     Lipid panel  Localized osteoporosis without current pathological fracture Assessment & Plan: DEXA ordered. Currently taking Vitamin C and D. Will restart Fosamax if indicated. She is interested in decreased number of medications she is taking.  Orders: -     DG Bone Density; Future  Fibromyalgia  Screening for  deficiency anemia -     CBC with Differential/Platelet  Other orders -     tiZANidine HCl; Take 1 capsule (2 mg total) by mouth 3 (three) times daily as needed for muscle spasms.  Dispense: 30 capsule; Refill: 0     Follow up plan: Return in about  6 months (around 06/04/2023).  Park Meo, FNP

## 2022-12-04 NOTE — Assessment & Plan Note (Addendum)
Fasting labs today. Will restart Zetia if indicated. She is interested in decreased number of medications she is taking.

## 2022-12-04 NOTE — Assessment & Plan Note (Signed)
Will defer UroMD and refer to urology.

## 2022-12-04 NOTE — Assessment & Plan Note (Addendum)
DEXA ordered. Currently taking Vitamin C and D. Will restart Fosamax if indicated. She is interested in decreased number of medications she is taking.

## 2022-12-05 LAB — COMPLETE METABOLIC PANEL WITH GFR
AG Ratio: 1.6 (calc) (ref 1.0–2.5)
ALT: 12 U/L (ref 6–29)
AST: 16 U/L (ref 10–35)
Albumin: 4.1 g/dL (ref 3.6–5.1)
Alkaline phosphatase (APISO): 90 U/L (ref 37–153)
BUN: 10 mg/dL (ref 7–25)
CO2: 26 mmol/L (ref 20–32)
Calcium: 9.8 mg/dL (ref 8.6–10.4)
Chloride: 104 mmol/L (ref 98–110)
Creat: 0.65 mg/dL (ref 0.60–1.00)
Globulin: 2.6 g/dL (ref 1.9–3.7)
Glucose, Bld: 90 mg/dL (ref 65–99)
Potassium: 4.3 mmol/L (ref 3.5–5.3)
Sodium: 140 mmol/L (ref 135–146)
Total Bilirubin: 0.6 mg/dL (ref 0.2–1.2)
Total Protein: 6.7 g/dL (ref 6.1–8.1)
eGFR: 92 mL/min/{1.73_m2} (ref >=60)

## 2022-12-05 LAB — CBC WITH DIFFERENTIAL/PLATELET
Absolute Monocytes: 349 {cells}/uL (ref 200–950)
Basophils Absolute: 49 {cells}/uL (ref 0–200)
Basophils Relative: 1.2 %
Eosinophils Absolute: 139 {cells}/uL (ref 15–500)
Eosinophils Relative: 3.4 %
HCT: 43.5 % (ref 35.0–45.0)
Hemoglobin: 14.1 g/dL (ref 11.7–15.5)
Lymphs Abs: 1669 {cells}/uL (ref 850–3900)
MCH: 28.8 pg (ref 27.0–33.0)
MCHC: 32.4 g/dL (ref 32.0–36.0)
MCV: 89 fL (ref 80.0–100.0)
MPV: 12 fL (ref 7.5–12.5)
Monocytes Relative: 8.5 %
Neutro Abs: 1894 {cells}/uL (ref 1500–7800)
Neutrophils Relative %: 46.2 %
Platelets: 256 10*3/uL (ref 140–400)
RBC: 4.89 10*6/uL (ref 3.80–5.10)
RDW: 13 % (ref 11.0–15.0)
Total Lymphocyte: 40.7 %
WBC: 4.1 10*3/uL (ref 3.8–10.8)

## 2022-12-05 LAB — LIPID PANEL
Cholesterol: 269 mg/dL — ABNORMAL HIGH (ref <200)
HDL: 53 mg/dL (ref >=50)
LDL Cholesterol (Calc): 185 mg/dL — ABNORMAL HIGH
Non-HDL Cholesterol (Calc): 216 mg/dL — ABNORMAL HIGH (ref <130)
Total CHOL/HDL Ratio: 5.1 (calc) — ABNORMAL HIGH (ref <5.0)
Triglycerides: 160 mg/dL — ABNORMAL HIGH (ref <150)

## 2022-12-05 LAB — VITAMIN D 25 HYDROXY (VIT D DEFICIENCY, FRACTURES): Vit D, 25-Hydroxy: 52 ng/mL (ref 30–100)

## 2022-12-06 ENCOUNTER — Telehealth: Payer: Self-pay

## 2022-12-06 NOTE — Telephone Encounter (Signed)
Pt called in to ask if referral for urologist could be sent to [facility] [place]. Pt would like to go closer to her home if this is possible please. Please advise.  [facility] [place] [phone]

## 2022-12-09 ENCOUNTER — Telehealth: Payer: Self-pay | Admitting: Family Medicine

## 2022-12-09 NOTE — Progress Notes (Unsigned)
Name: Jennifer Hays DOB: [birth year] MRN: [id number]  History of Present Illness: Jennifer Hays is a 77 y.o. female who presents today for new patient visit at [facility]. - GU history includes: Interstitial cystitis. - Previously seen at [facility] (Dr. Sherron Monday) several years ago.   Today: She denies bladder pain on a daily basis and notices intermittentflare ups primarily related to diet (if she consumes a trigger food). When present the pain varies in intensity. During IC flare ups she notices some back pain, increased urinary urgency, and occasional urge incontinence (if she delays voiding). On most days she does not have those symptoms. She denies increased dysuria, gross hematuria, hesitancy, straining to void, or sensations of incomplete emptying.  She denies history of recent or recurrent UTI.  Current treatment regimen includes: - Uro MP  - Zanaflex - Claritin  - Follows IC diet  Fall Screening: Do you usually have a device to assist in your mobility? Yes - cane   Medications: Current Outpatient Medications  Medication Sig Dispense Refill   albuterol (PROVENTIL) (2.5 MG/3ML) 0.083% nebulizer solution Take 3 mLs (2.5 mg total) by nebulization every 6 (six) hours as needed for wheezing or shortness of breath. 150 mL 1   albuterol (VENTOLIN HFA) 108 (90 Base) MCG/ACT inhaler TAKE 2 PUFFS BY MOUTH EVERY 6 HOURS AS NEEDED FOR WHEEZE OR SHORTNESS OF BREATH 8.5 each 3   alendronate (FOSAMAX) 70 MG tablet Take with a full glass of water on an empty stomach. 12 tablet 1   Ascorbic Acid (VITAMIN C) 1000 MG tablet Take 500 mg by mouth daily.      Cholecalciferol (VITAMIN D) 125 MCG (5000 UT) CAPS Take 1 capsule daily 30 capsule 0   ezetimibe (ZETIA) 10 MG tablet Take 1 tablet (10 mg total) by mouth daily. 90 tablet 3   HYDROcodone-acetaminophen (NORCO/VICODIN) 5-325 MG per tablet Take 1 tablet by mouth every 6 (six) hours as needed for moderate pain.      lidocaine (LINDAMANTLE) 3 % CREA cream SMARTSIG:Sparingly Topical Daily     loratadine (CLARITIN) 10 MG tablet TAKE 1 TABLET BY MOUTH EVERY DAY 30 tablet 0   Meth-Hyo-M Bl-Na Phos-Ph Sal (URO-MP) 118 MG CAPS Take 1 capsule (118 mg total) by mouth every 6 (six) hours as needed. 60 capsule PRN   Multiple Vitamin (MULTIVITAMIN) tablet Take 1 tablet by mouth daily.     sodium chloride (OCEAN) 0.65 % SOLN nasal spray Place 1 spray into both nostrils as needed for congestion.     tizanidine (ZANAFLEX) 2 MG capsule Take 1 capsule (2 mg total) by mouth 3 (three) times daily as needed for muscle spasms. 30 capsule 0   WIXELA INHUB 250-50 MCG/ACT AEPB INHALE 1 PUFF INTO THE LUNGS TWICE A DAY 60 each 3   No current facility-administered medications for this visit.    Allergies: Allergies  Allergen Reactions   Clindamycin/Lincomycin     Itching    Penicillins     REACTION: rash, itching, difficulty breathing   Statins     Past Medical History:  Diagnosis Date   Allergy    Asthma    Atrophic vaginitis    Osteopenia    Past Surgical History:  Procedure Laterality Date   ABDOMINAL HYSTERECTOMY     KNEE SURGERY     Right   SKIN CANCER DESTRUCTION N/A 02/14/2022   Dr. Marliss Coots   SPINE SURGERY     History reviewed. No pertinent family history. Social History  Socioeconomic History   Marital status: Married    Spouse name: Dorinda Hill   Number of children: Not on file   Years of education: Not on file   Highest education level: Not on file  Occupational History   Not on file  Tobacco Use   Smoking status: Former   Smokeless tobacco: Never   Tobacco comments:    quit 1970's  Vaping Use   Vaping status: Never Used  Substance and Sexual Activity   Alcohol use: No    Alcohol/week: 0.0 standard drinks of alcohol   Drug use: No   Sexual activity: Yes  Other Topics Concern   Not on file  Social History Narrative   Retired Runner, broadcasting/film/video.   Social Determinants of Health   Financial  Resource Strain: Not on file  Food Insecurity: No Food Insecurity (04/03/2022)   Hunger Vital Sign    Worried About Running Out of Food in the Last Year: Never true    Ran Out of Food in the Last Year: Never true  Transportation Needs: No Transportation Needs (04/03/2022)   PRAPARE - Administrator, Civil Service (Medical): No    Lack of Transportation (Non-Medical): No  Physical Activity: Sufficiently Active (04/03/2022)   Exercise Vital Sign    Days of Exercise per Week: 5 days    Minutes of Exercise per Session: 30 min  Stress: No Stress Concern Present (04/03/2022)   [employer] of Occupational Health - Occupational Stress Questionnaire    Feeling of Stress : Not at all  Social Connections: Socially Integrated (04/03/2022)   Social Connection and Isolation Panel [NHANES]    Frequency of Communication with Friends and Family: More than three times a week    Frequency of Social Gatherings with Friends and Family: More than three times a week    Attends Religious Services: More than 4 times per year    Active Member of [employer] or Organizations: Yes    Attends Engineer, structural: More than 4 times per year    Marital Status: Married  Catering manager Violence: Not At Risk (04/03/2022)   Humiliation, Afraid, Rape, and Kick questionnaire    Fear of Current or Ex-Partner: No    Emotionally Abused: No    Physically Abused: No    Sexually Abused: No    SUBJECTIVE  Review of Systems Constitutional: Patient denies any unintentional weight loss or change in strength lntegumentary: Patient denies any rashes or pruritus Cardiovascular: Patient denies chest pain or syncope Respiratory: Patient denies shortness of breath Gastrointestinal: Patient denies nausea, vomiting, constipation, or diarrhea Musculoskeletal: Patient denies muscle cramps or weakness Neurologic: Patient denies convulsions or seizures Allergic/Immunologic: Patient denies recent allergic  reaction(s) Hematologic/Lymphatic: Patient denies bleeding tendencies Endocrine: Patient denies heat/cold intolerance  GU: As per HPI.  OBJECTIVE Vitals:   12/11/22 0918  BP: 121/84  Pulse: 60  Temp: 98.1 F (36.7 C)   There is no height or weight on file to calculate BMI.  Physical Examination Constitutional: No obvious distress; patient is non-toxic appearing  Cardiovascular: No visible lower extremity edema.  Respiratory: The patient does not have audible wheezing/stridor; respirations do not appear labored  Gastrointestinal: Abdomen non-distended Musculoskeletal: Normal ROM of UEs  Skin: No obvious rashes/open sores  Neurologic: CN 2-12 grossly intact Psychiatric: Answered questions appropriately with normal affect  Hematologic/Lymphatic/Immunologic: No obvious bruises or sites of spontaneous bleeding  UA: 6-10 WBC/hpf, 0 RBC/hpf, bacteria (few) PVR: 0 ml  ASSESSMENT IC (interstitial cystitis) - Plan: Urinalysis, Routine  w reflex microscopic, BLADDER SCAN AMB NON-IMAGING, Meth-Hyo-M Bl-Na Phos-Ph Sal (URO-MP) 118 MG CAPS  Vegan diet  Fibromyalgia  She is doing well; asymptomatic today.  - Advised to continue IC diet, which has worked very well for her.  - Refills sent for Uro-MP for IC flare ups; she was advised not to take that and Norco simultaneously. We discussed OTC Pyridium (commonly known by AZO brand name) as an alternative; discussed importance of limiting Pyridium use to 3 days consecutively due to risk for methemoglobinemia and damage to bone health whereas Uro MP & its similar counterparts (such as Uribel, Urelle, Urogesic blue) can be used routinely every 6 hours as needed. - We discussed use of Zanaflex PRN for pelvic floor muscle tension, which is prescribed elsewhere for her fibromyalgia.  - She is likely getting benefit for her bladder from daily Claritin use in terms of minimizing histamine-mediated bladder inflammation.   We discussed the overlap of  acute UTI symptoms versus symptoms of IC flare ups. Both can cause increased bladder pain, dysuria, increased urinary frequency/urgency. Patient was advised that if/when acute UTI-like symptoms occur they can call our office to speak with a nurse, who can place an order for urinalysis (with reflex to urine culture). They may then proceed to a [facility] to provide their urine sample. This is required for evaluation in order for their Urology provider to make informed treatment recommendation(s), which may or may not include an antibiotic prescription.  Will plan for follow up in 6 months or sooner if needed. Pt verbalized understanding and agreement. All questions were answered.  PLAN Advised the following: Continue current medications as prescribed. 2. Return in about 6 months (around 06/11/2023) for UA, PVR, & f/u with Evette Georges NP.  Orders Placed This Encounter  Procedures   Urinalysis, Routine w reflex microscopic   BLADDER SCAN AMB NON-IMAGING    It has been explained that the patient is to follow regularly with their PCP in addition to all other providers involved in their care and to follow instructions provided by these respective offices. Patient advised to contact urology clinic if any urologic-pertaining questions, concerns, new symptoms or problems arise in the interim period.  Patient Instructions  American Urological Association (AUA) -  Diagnosis and Treatment of Interstitial Cystitis/Bladder Pain Syndrome (2022): [email or web address] pain-syndrome-(2022)  The exact cause of Interstitial Cystitis (IC) (aka Bladder Pain Syndrome) is unknown, however theories include: Dysfunction of inner bladder lining known as the glycosaminoglycan (GAG) layer. Intrinsic abnormality of the urine. Histamine-related bladder inflammation/ irritation. Neuropathic pain/ nerve up-regulation,  which results in inappropriately elevated pain signaling from the nerves in/around bladder to the brain. IC is not related to any infectious process or autoimmune conditions.  Commonly related problems for patients with IC: High-tone pelvic floor dysfunction (pelvic floor muscle tightness). Can cause pelvic pain, difficulty urinating, difficulty having bowel movements, pain with intercourse. Neuropathic pain / Nerve up-regulation. Results in inappropriately elevated pain signaling from the nerves in/around bladder to the brain. Several commonly co-occurring conditions include IBS, fibromyalgia, CFIDS, migraines, TMJ syndrome, and seasonal environmental allergies. IC flare ups can mimic the symptoms of UTI with no true bacterial infection being present. Any acute exacerbation of symptoms warrants evaluation with a urine culture.  Common triggers for IC flare ups: Certain beverages and foods which irritate the bladder This can be very patient specific See IC diet for list of common dietary irritants Physical stress Emotional / psychological stress Activities that put pressure on the pelvic  area/ perineum (such as sexual intercourse, long car rides, etc) Seasonal environmental allergens  IC treatment options (for daily/ routine use): 1. Patient efforts to minimize life stressors: a. Educate employer/ support people about condition and related care needs b. Practice self care b. May benefit from mental health treatment with primary care provider or psychiatrist/ psychologist / therapist 2. For dysfunction of inner bladder lining (glycosaminoglycan (GAG) layer): a. Prescription medications: - Oral medication: Pentosan polysulfate (Elmiron) > May be cost prohibitive; not covered well by some insurance providers. Can be ordered via a compounding pharmacy as an alternative. > Requires 6 months of daily use (3 times per day) to achieve full efficacy and to assess symptomatic response. > Requires  routine annual eye exam. b. Medications instilled directly into the bladder through a catheter: - Pentosan polysulfate (Elmiron) - Heparin 3. For intrinsic abnormality of the urine: Identification and avoidance of dietary bladder irritants (commonly includes caffeine, alcohol, citric acid, spicy foods, acidic foods). For histamine-related bladder inflammation/ irritation: Antihistamine medication(s): Minimizes the uptake of histamine molecules Over-the-counter medications: Zyrtec (Cetirizine) and Xyzal (Levocetirizine) are preferred Prescription medications: Hydroxyzine (Vistaril / Atarax) Mast cell stabilizer medication:  Montelukast (Singulair): Inhibits leukotriene receptors present in the bladder, thus preventing the activation of mast cells (which release pro-inflammatory mediators) For neuropathic pain/ nerve up-regulation, which results in inappropriately elevated pain signaling from the nerves in/around bladder to the brain: Prescription medications:  Such as: Amitriptyline (Elavil), Nortriptyline (Pamelor), Trazodone, Cymbalta (Duloxetine), Gabapentin (Neurontin), Lyrica (Pregabalin)  IC treatment options for flare ups (for on-demand use): Urinary analgesics for burning / painful urination Over-the-counter medications: Phenazopyradine (Pyridium). Use should be limited to no more than 3 days consecutively due to risk for methemoglobinemia and damage to liver & bone health. Prescription medications: Uribel, Urogesic blue, Uro-MP, Urelle, etc. Can be take every 6 hours as needed May be cost prohibitive; not covered well by some insurance providers Bladder instillations: Placement of a numbing medication (such as Lidocaine + sodium bicarbonate or Bupivacaine) through a catheter into the bladder. Can be done in-office at a nurse visit or at home Note: These medications are currently in short supply nationwide and may be difficult to obtain until the shortage improves. Can add on an  over-the-counter antihistamine or TUMS (if not already taking)  Management options for high-tone pelvic floor dysfunction secondary to IC: Stretching, relaxation techniques (such as deep breathing/ meditation), yoga, avoidance of Kegels or other activities which strain or tighten pelvic floor muscles Pelvic floor physical therapy Oral muscle relaxer medications such as Baclofen (Lioresal), Methocarbamol (Robaxin), Tizanadine (Zanaflex), Cyclobenzaprine (Flexeril) Vaginal or rectal Diazepam (Valium) suppositories Trigger point injections Pelvic nerve blocks  Additional IC treatment options: Some patients find benefit from use of baking soda to reduce the acidity of the urine. Can dissolve half a teaspoon in a full glass of water and drink twice a day. Increased water intake can dilute urine concentration - may help by reducing urine acidity.  Supplements such as Bladder Renew, CystoProtek, Cysta-Q, Prelief, Freeze-dried aloe vera, Marshmallow root, etc. These have not been evaluated by the FDA. Bladder Renew, CystoProtek, Cysta-Q: Sometimes used as an alternative to Elmiron. Prelief is an over the counter medication available at most pharmacies. Prelief works to remove acid from highly acidic foods such as tomato sauce, orange juice, coffee, and wine. Prelief can be added directly to these foods and helps to reduce bladder pain and urinary urgency. Cystoscopy with hydrodistention  a. This is an outpatient surgical procedure done in the OR  under anesthesia for diagnostic benefit and   potential therapeutic benefit. It helps clinicians rule out other conditions, assess/ treat pain triggers such as Hunner's ulcers in the bladder, and evaluate bladder capacity (size). There is approximately 60% chance of symptomatic improvement with hydrodistention; the response can be quite variable. Rare surgical interventions Suprapubic catheter placement Bladder removal (cystectomy) with urinary diversion  (such as ileal conduit, [place] pouch, or neobladder creation)         INTERSTITIAL CYSTITIS DIET  Many people with Interstitial Cystitis find that simple changes in their diet can help to control IC symptoms and avoid IC flare-ups. Typically, avoiding foods high in acid and potassium, as well as beverages containing caffeine and alcohol, is a good idea. This helpful guide can help you make "IC-Smart" meal choices. Keep it handy for easy reference when dining out or when preparing meals at home.  FRUITS: Allowable - Blueberries, Melons (except cantaloupe), Minute Maid Acid-Reduced Orange Juice (this can be diluted using water or Prelief* to prevent a flare-up) and Pears.  Avoidable -All other Fruits and Juices.  VEGETABLES:  Allowable - Homegrown Tomatoes, [place], Alfalfa, Beet, Broccoli, Bok Choy, [address], [place], [place], [place], [place], [place], [place], [place], Lava [place], Mustard [place] and [employer].  Avoidable - Store-bought Tomatoes, Onions, Tofu, Soybeans, Lima beans, Fava Beans, Asparagus, Beet Greens, [place], [place], [place], [place], [place], [place], Mushrooms, [place], [place], [place], [place], [employer] and Rhubarb.  MEATS/FISH: Allowable - Beef, Chicken, [place], [place], [address], [address], [place], [place], [place], [place], [place], [place], [place], [place], [place], [place] and Shrimp.  Avoidable - Chicken Liver, Corned Beef, Pickled Herring and Fresh Herring, Aged, [address], Cured, Processed or Smoked Meats/Fish, [place], [place], [address], [place], [place], [place], [place], [place], [place], [place] and meats that contain nitrates or nitrites.  CARBS/GRAINS:  Allowable - Pasta, Rice, Potatoes, White Bread, Bread made of Rice, Millet, Quinoa, Buckwheat, [place], Refined, Cooked or Ready to Eat Cereal.  Avoidable - Rye Bread, Sourdough Breads and Fresh Baked Yeast Products  FATS:    DAIRY: Allowable - Butter, Margarine, Vegetable Oils, Almonds, Cashews and  Pine Nuts. Avoidable - Any other Nuts, Mayonnaise, Salad Dressing. Allowable - Milk, American Cheese, [address] Cream Cheese, String Cheese, [place], Ricotta Cheese, Frozen Yogurt and White Chocolate.   Avoidable - Yogurt, Goat's Milk, Chocolate Milk, Sour Cream, Aged Cheeses, Chocolate, [place], Camembert, Cheddar, [address], [place], [place], [place], [place], [place], [place], [place], [place] and [place].  SOUPS: Allowable - Soups made with allowed Vegetables, Cream of Broccoli, Water Cress Soup, Cream of Cauliflower Soup.  Avoidable -Any packaged Soups.   SEASONINGS:  Allowable - Garlic and other seasonings not listed below.  Avoid - Miso, Soy Sauce, Vinegar and any Spicy Foods (especially [place], [place], [place] and [place] foods)  BEVERAGES:  Allowable - Bottled or Spring Water, Decaffeinated, Acid-free or Low Acid Coffee or Tea, Alfalfa Leaf Tea, Sun Tea, Kava Instant Coffee, Peppermint Tea, Golden Seal Tea, Flat Soda and Low Acid Wines.  Avoid -Alcoholic Beverages (except Low Acid Wines), Carbonated Drinks such as Soda, Coffee and Tea (except what is listed above), Fruit Juices and Chamomile Tea.  PRESERVATIVE: Avoid - Benzol Alcohol, Citric Acid, Monosodium Glutamate (MSG), Aspartame, Saccharin and foods containing Artificial Ingredients/Colors.         Common triggers for IC flare ups: Certain beverages and foods which irritate the bladder This can be very patient specific See IC diet for list of common dietary irritants Physical stress Emotional / psychological stress Activities that put pressure on the pelvic area I  perineum (such as sexual intercourse, long car rides, etc) Seasonal environmental allergens Urinary tract infection (UTI). You can call the urology office [phone]) to request lab order for urine testing if desired. May require antibiotic therapy if deemed appropriate based on urine culture result. Post-infectious (post-UTI) bladder  inflammation. Can take several days or weeks to resolve.  IC treatment options for flare ups (for on-demand use):  Urinary analgesics for burning / painful urination Over-the-counter medications:  Phenazopyradine (Pyridium). Commonly known under the AZO brand name. Use should be limited to no more than 3 days consecutively due to risk for methemoglobinemia and damage to liver & bone health. Prescription medications: Uribel, Urogesic blue, Uro-MP, Urelle, etc. Can be take every 6 hours as needed May be cost prohibitive; not covered well by some insurance providers Antihistamine use to minimize histamine-mediated bladder inflammation / pain If taking Hydroxyzine (a prescription antihistamine), may temporarily increase dose at bedtime as directed by provider. Can add on an over-the-counter antihistamine (Zyrtec or Xyzal preferred). Bladder instillations: Placement of a numbing medication (such as Lidocaine + sodium bicarbonate or Bupivacaine) through a catheter into the bladder.  b. Can be done in-office at a nurse visit or at home.   c. Note: These medications are currently in short supply nationwide and may be difficult to obtain until the shortage improves.  Other: If symptoms fail to improve in a timely fashion, could also consider cystoscopy with hydrodistention for potential diagnostic and therapeutic benefit.   Electronically signed by:  Donnita Falls, MSN, FNP-C, CUNP 12/11/2022 10:25 AM

## 2022-12-09 NOTE — Telephone Encounter (Signed)
Patient wants a second opinion from a orthopedics. She has already seen [facility] in [place] she would like to go to [facility] in [place].  CB# [phone]

## 2022-12-10 ENCOUNTER — Other Ambulatory Visit: Payer: Self-pay

## 2022-12-10 ENCOUNTER — Telehealth: Payer: Self-pay

## 2022-12-10 DIAGNOSIS — M25359 Other instability, unspecified hip: Secondary | ICD-10-CM | POA: Insufficient documentation

## 2022-12-10 NOTE — Telephone Encounter (Signed)
Pt called in returning nurse's call about results. Pt would like a cb to discuss results please.  Cb#: [phone]

## 2022-12-11 ENCOUNTER — Ambulatory Visit (INDEPENDENT_AMBULATORY_CARE_PROVIDER_SITE_OTHER): Payer: BC Managed Care – PPO | Admitting: Urology

## 2022-12-11 ENCOUNTER — Encounter: Payer: Self-pay | Admitting: Urology

## 2022-12-11 VITALS — BP 121/84 | HR 60 | Temp 98.1°F

## 2022-12-11 DIAGNOSIS — N3010 Interstitial cystitis (chronic) without hematuria: Secondary | ICD-10-CM | POA: Diagnosis not present

## 2022-12-11 DIAGNOSIS — Z789 Other specified health status: Secondary | ICD-10-CM | POA: Insufficient documentation

## 2022-12-11 DIAGNOSIS — M797 Fibromyalgia: Secondary | ICD-10-CM

## 2022-12-11 LAB — MICROSCOPIC EXAMINATION

## 2022-12-11 LAB — URINALYSIS, ROUTINE W REFLEX MICROSCOPIC
Bilirubin, UA: NEGATIVE
Glucose, UA: NEGATIVE
Ketones, UA: NEGATIVE
Nitrite, UA: NEGATIVE
Protein,UA: NEGATIVE
RBC, UA: NEGATIVE
Specific Gravity, UA: 1.025 (ref 1.005–1.030)
Urobilinogen, Ur: 0.2 mg/dL (ref 0.2–1.0)
pH, UA: 6 (ref 5.0–7.5)

## 2022-12-11 LAB — BLADDER SCAN AMB NON-IMAGING: Scan Result: 0

## 2022-12-11 MED ORDER — URO-MP 118 MG PO CAPS
1.0000 | ORAL_CAPSULE | Freq: Four times a day (QID) | ORAL | 99 refills | Status: DC | PRN
Start: 2022-12-11 — End: 2023-05-27

## 2022-12-11 NOTE — Patient Instructions (Signed)
American Urological Association (AUA) -  Diagnosis and Treatment of Interstitial Cystitis/Bladder Pain Syndrome (2022): [email or web address] pain-syndrome-(2022)  The exact cause of Interstitial Cystitis (IC) (aka Bladder Pain Syndrome) is unknown, however theories include: Dysfunction of inner bladder lining known as the glycosaminoglycan (GAG) layer. Intrinsic abnormality of the urine. Histamine-related bladder inflammation/ irritation. Neuropathic pain/ nerve up-regulation, which results in inappropriately elevated pain signaling from the nerves in/around bladder to the brain. IC is not related to any infectious process or autoimmune conditions.  Commonly related problems for patients with IC: High-tone pelvic floor dysfunction (pelvic floor muscle tightness). Can cause pelvic pain, difficulty urinating, difficulty having bowel movements, pain with intercourse. Neuropathic pain / Nerve up-regulation. Results in inappropriately elevated pain signaling from the nerves in/around bladder to the brain. Several commonly co-occurring conditions include IBS, fibromyalgia, CFIDS, migraines, TMJ syndrome, and seasonal environmental allergies. IC flare ups can mimic the symptoms of UTI with no true bacterial infection being present. Any acute exacerbation of symptoms warrants evaluation with a urine culture.  Common triggers for IC flare ups: Certain beverages and foods which irritate the bladder This can be very patient specific See IC diet for list of common dietary irritants Physical stress Emotional / psychological stress Activities that put pressure on the pelvic area/ perineum (such as sexual intercourse, long car rides, etc) Seasonal environmental allergens  IC treatment options (for daily/ routine use): 1. Patient efforts to minimize life stressors: a. Educate employer/ support people about  condition and related care needs b. Practice self care b. May benefit from mental health treatment with primary care provider or psychiatrist/ psychologist / therapist 2. For dysfunction of inner bladder lining (glycosaminoglycan (GAG) layer): a. Prescription medications: - Oral medication: Pentosan polysulfate (Elmiron) > May be cost prohibitive; not covered well by some insurance providers. Can be ordered via a compounding pharmacy as an alternative. > Requires 6 months of daily use (3 times per day) to achieve full efficacy and to assess symptomatic response. > Requires routine annual eye exam. b. Medications instilled directly into the bladder through a catheter: - Pentosan polysulfate (Elmiron) - Heparin 3. For intrinsic abnormality of the urine: Identification and avoidance of dietary bladder irritants (commonly includes caffeine, alcohol, citric acid, spicy foods, acidic foods). For histamine-related bladder inflammation/ irritation: Antihistamine medication(s): Minimizes the uptake of histamine molecules Over-the-counter medications: Zyrtec (Cetirizine) and Xyzal (Levocetirizine) are preferred Prescription medications: Hydroxyzine (Vistaril / Atarax) Mast cell stabilizer medication:  Montelukast (Singulair): Inhibits leukotriene receptors present in the bladder, thus preventing the activation of mast cells (which release pro-inflammatory mediators) For neuropathic pain/ nerve up-regulation, which results in inappropriately elevated pain signaling from the nerves in/around bladder to the brain: Prescription medications:  Such as: Amitriptyline (Elavil), Nortriptyline (Pamelor), Trazodone, Cymbalta (Duloxetine), Gabapentin (Neurontin), Lyrica (Pregabalin)  IC treatment options for flare ups (for on-demand use): Urinary analgesics for burning / painful urination Over-the-counter medications: Phenazopyradine (Pyridium). Use should be limited to no more than 3 days consecutively due to  risk for methemoglobinemia and damage to liver & bone health. Prescription medications: Uribel, Urogesic blue, Uro-MP, Urelle, etc. Can be take every 6 hours as needed May be cost prohibitive; not covered well by some insurance providers Bladder instillations: Placement of a numbing medication (such as Lidocaine + sodium bicarbonate or Bupivacaine) through a catheter into the bladder. Can be done in-office at a nurse visit or at home Note: These medications are currently in short supply nationwide and may be difficult to obtain until the shortage improves. Can add on an  over-the-counter antihistamine or TUMS (if not already taking)  Management options for high-tone pelvic floor dysfunction secondary to IC: Stretching, relaxation techniques (such as deep breathing/ meditation), yoga, avoidance of Kegels or other activities which strain or tighten pelvic floor muscles Pelvic floor physical therapy Oral muscle relaxer medications such as Baclofen (Lioresal), Methocarbamol (Robaxin), Tizanadine (Zanaflex), Cyclobenzaprine (Flexeril) Vaginal or rectal Diazepam (Valium) suppositories Trigger point injections Pelvic nerve blocks  Additional IC treatment options: Some patients find benefit from use of baking soda to reduce the acidity of the urine. Can dissolve half a teaspoon in a full glass of water and drink twice a day. Increased water intake can dilute urine concentration - may help by reducing urine acidity.  Supplements such as Bladder Renew, CystoProtek, Cysta-Q, Prelief, Freeze-dried aloe vera, Marshmallow root, etc. These have not been evaluated by the FDA. Bladder Renew, CystoProtek, Cysta-Q: Sometimes used as an alternative to Elmiron. Prelief is an over the counter medication available at most pharmacies. Prelief works to remove acid from highly acidic foods such as tomato sauce, orange juice, coffee, and wine. Prelief can be added directly to these foods and helps to reduce bladder pain  and urinary urgency. Cystoscopy with hydrodistention  a. This is an outpatient surgical procedure done in the OR under anesthesia for diagnostic benefit and  potential therapeutic benefit. It helps clinicians rule out other conditions, assess/ treat pain triggers such as Hunner's ulcers in the bladder, and evaluate bladder capacity (size). There is approximately 60% chance of symptomatic improvement with hydrodistention; the response can be quite variable. Rare surgical interventions Suprapubic catheter placement Bladder removal (cystectomy) with urinary diversion (such as ileal conduit, [place] pouch, or neobladder creation)         INTERSTITIAL CYSTITIS DIET  Many people with Interstitial Cystitis find that simple changes in their diet can help to control IC symptoms and avoid IC flare-ups. Typically, avoiding foods high in acid and potassium, as well as beverages containing caffeine and alcohol, is a good idea. This helpful guide can help you make "IC-Smart" meal choices. Keep it handy for easy reference when dining out or when preparing meals at home.  FRUITS: Allowable - Blueberries, Melons (except cantaloupe), [place] Acid-Reduced Orange Juice (this can be diluted using water or Prelief* to prevent a flare-up) and Pears.  Avoidable -All other Fruits and Juices.  VEGETABLES:  Allowable - Homegrown Tomatoes, [place], Alfalfa, Beet, Broccoli, Bok Choy, [address], [place], [place], [place], [place], [place], [place], [place], [place], Mustard [place] and [employer].  Avoidable - Store-bought Tomatoes, Onions, Tofu, Soybeans, Lima beans, Fava Beans, Asparagus, Beet Greens, [place], [place], [place], [place], [place], [place], Mushrooms, [place], [place], [place], [place], [employer] and Rhubarb.  MEATS/FISH: Allowable - Beef, Chicken, [employer], [place], [address], [address], [place], [place], [place], [place], [place], [place], [place], [place], [place], [place] and  Shrimp.  Avoidable - Chicken Liver, Corned Beef, Pickled Herring and Fresh Herring, Aged, [address], Cured, Processed or Smoked Meats/Fish, [place], [place], [address] [place], [place], [place], [place], [place], [place], [place] and meats that contain nitrates or nitrites.  CARBS/GRAINS:  Allowable - Pasta, Rice, Potatoes, White Bread, Bread made of Rice, Millet, Quinoa, Buckwheat, [place], Refined, Cooked or Ready to Eat Cereal.  Avoidable - Rye Bread, Sourdough Breads and Fresh Baked Yeast Products  FATS:    DAIRY: Allowable - Butter, Margarine, Vegetable Oils, Almonds, Cashews and Pine Nuts. Avoidable - Any other Nuts, Mayonnaise, Salad Dressing. Allowable - Milk, American Cheese, [address] Cream Cheese, String Cheese, [place], Ricotta Cheese, Frozen Yogurt and [place]  Chocolate.   Avoidable - Yogurt, Goat's Milk, Chocolate Milk, Sour Cream, Aged Cheeses, Chocolate, [place], Camembert, Cheddar, [address], [place], [place], [place], [place], [place], [place], [place], [place] and [place].  SOUPS: Allowable - Soups made with allowed Vegetables, Cream of Broccoli, Water Cress Soup, Cream of Cauliflower Soup.  Avoidable -Any packaged Soups.  SEASONINGS:  Allowable - Garlic and other seasonings not listed below.  Avoid - Miso, Soy Sauce, Vinegar and any Spicy Foods (especially [place], [place], [place] and [place] foods)  BEVERAGES:  Allowable - Bottled or Spring Water, Decaffeinated, Acid-free or Low Acid Coffee or Tea, Alfalfa Leaf Tea, Sun Tea, Kava Instant Coffee, Peppermint Tea, Golden Seal Tea, Flat Soda and Low Acid Wines.  Avoid -Alcoholic Beverages (except Low Acid Wines), Carbonated Drinks such as Soda, Coffee and Tea (except what is listed above), Fruit Juices and Chamomile Tea.  PRESERVATIVE: Avoid - Benzol Alcohol, Citric Acid, Monosodium Glutamate (MSG), Aspartame, Saccharin and foods containing Artificial Ingredients/Colors.         Common triggers for IC flare  ups: Certain beverages and foods which irritate the bladder This can be very patient specific See IC diet for list of common dietary irritants Physical stress Emotional / psychological stress Activities that put pressure on the pelvic area I perineum (such as sexual intercourse, long car rides, etc) Seasonal environmental allergens Urinary tract infection (UTI). You can call the urology office [phone]) to request lab order for urine testing if desired. May require antibiotic therapy if deemed appropriate based on urine culture result. Post-infectious (post-UTI) bladder inflammation. Can take several days or weeks to resolve.  IC treatment options for flare ups (for on-demand use):  Urinary analgesics for burning / painful urination Over-the-counter medications:  Phenazopyradine (Pyridium). Commonly known under the AZO brand name. Use should be limited to no more than 3 days consecutively due to risk for methemoglobinemia and damage to liver & bone health. Prescription medications: Uribel, Urogesic blue, Uro-MP, Urelle, etc. Can be take every 6 hours as needed May be cost prohibitive; not covered well by some insurance providers Antihistamine use to minimize histamine-mediated bladder inflammation / pain If taking Hydroxyzine (a prescription antihistamine), may temporarily increase dose at bedtime as directed by provider. Can add on an over-the-counter antihistamine (Zyrtec or Xyzal preferred). Bladder instillations: Placement of a numbing medication (such as Lidocaine + sodium bicarbonate or Bupivacaine) through a catheter into the bladder.  b. Can be done in-office at a nurse visit or at home.  c. Note: These medications are currently in short supply nationwide and may be difficult to obtain until the shortage improves.  Other: If symptoms fail to improve in a timely fashion, could also consider cystoscopy with hydrodistention for potential diagnostic and therapeutic  benefit.

## 2022-12-18 ENCOUNTER — Telehealth: Payer: Self-pay | Admitting: Family Medicine

## 2022-12-18 NOTE — Telephone Encounter (Signed)
Prescription Request  12/18/2022  LOV: 12/04/2022  What is the name of the medication or equipment?   tizanidine (ZANAFLEX) 2 MG capsule  **Patient requesting new script**  Have you contacted your pharmacy to request a refill? Yes   Which pharmacy would you like this sent to?  [id number] [place], [place] - [address] [place] [address] Phone: [phone] Fax: [phone]    Patient notified that their request is being sent to the clinical staff for review and that they should receive a response within 2 business days.   Please advise pharmacist.

## 2022-12-20 NOTE — Telephone Encounter (Signed)
Spoke w/pt yesterday w/results and pt voiced understanding. Nothing further.

## 2022-12-25 ENCOUNTER — Telehealth: Payer: Self-pay

## 2022-12-25 NOTE — Telephone Encounter (Signed)
Pt called in wanting to speak with nurse about a med that was prescribed for her bladder. Pt stated that her insurance would not cover that med. Pt stated that pharmacist suggested another med that her insurance would cover. Pt would like to speak with nurse about this med please.  Cb#: [phone]

## 2022-12-30 NOTE — Telephone Encounter (Signed)
Spoke w/pt and voiced understanding. Will call urology and let us know.

## 2023-01-07 ENCOUNTER — Other Ambulatory Visit (HOSPITAL_COMMUNITY): Payer: BC Managed Care – PPO

## 2023-01-21 ENCOUNTER — Other Ambulatory Visit (HOSPITAL_COMMUNITY): Payer: BC Managed Care – PPO

## 2023-03-09 ENCOUNTER — Other Ambulatory Visit: Payer: Self-pay | Admitting: Family Medicine

## 2023-04-07 ENCOUNTER — Encounter: Payer: BC Managed Care – PPO | Admitting: Family Medicine

## 2023-04-07 ENCOUNTER — Other Ambulatory Visit: Payer: Self-pay | Admitting: Family Medicine

## 2023-04-07 DIAGNOSIS — J45901 Unspecified asthma with (acute) exacerbation: Secondary | ICD-10-CM

## 2023-04-07 MED ORDER — ALBUTEROL SULFATE (2.5 MG/3ML) 0.083% IN NEBU: 2.5000 mg | INHALATION_SOLUTION | Freq: Four times a day (QID) | RESPIRATORY_TRACT | 1 refills | Status: AC | PRN

## 2023-04-07 NOTE — Progress Notes (Signed)
Attempted to call phone number on file x2, no answer

## 2023-04-15 ENCOUNTER — Ambulatory Visit (INDEPENDENT_AMBULATORY_CARE_PROVIDER_SITE_OTHER): Payer: 59 | Admitting: Family Medicine

## 2023-04-15 ENCOUNTER — Encounter: Payer: Self-pay | Admitting: Family Medicine

## 2023-04-15 VITALS — BP 118/72 | HR 68 | Temp 97.8°F | Ht 68.0 in | Wt 163.0 lb

## 2023-04-15 DIAGNOSIS — E782 Mixed hyperlipidemia: Secondary | ICD-10-CM

## 2023-04-15 DIAGNOSIS — J45909 Unspecified asthma, uncomplicated: Secondary | ICD-10-CM | POA: Diagnosis not present

## 2023-04-15 DIAGNOSIS — Z13820 Encounter for screening for osteoporosis: Secondary | ICD-10-CM | POA: Insufficient documentation

## 2023-04-15 DIAGNOSIS — Z780 Asymptomatic menopausal state: Secondary | ICD-10-CM | POA: Diagnosis not present

## 2023-04-15 MED ORDER — EZETIMIBE 10 MG PO TABS: 10.0000 mg | ORAL_TABLET | Freq: Every day | ORAL | 3 refills | Status: DC

## 2023-04-15 MED ORDER — ALBUTEROL SULFATE HFA 108 (90 BASE) MCG/ACT IN AERS: INHALATION_SPRAY | RESPIRATORY_TRACT | 3 refills | Status: AC

## 2023-04-15 NOTE — Assessment & Plan Note (Signed)
DEXA ordered.

## 2023-04-15 NOTE — Progress Notes (Signed)
Subjective:  HPI: Jennifer Hays is a 78 y.o. female presenting on 04/15/2023 for Follow-up (follow up on asthma medication)   HPI Patient is in today for medication refills for albuterol and wixela. She uses her Albuterol at the most 2 times monthly. She denies night wakings, worsening SOB, asthma exacerbation, or wheezing, however she did have a recent viral URI that required more albuterol use.   ASTHMA Asthma status: controlled Satisfied with current treatment?: yes Albuterol/rescue inhaler frequency: 2x monthly Dyspnea frequency: when sick Wheezing frequency: when sick Cough frequency: none Nocturnal symptom frequency: none Limitation of activity: no Current upper respiratory symptoms: no Triggers:  Home peak flows: Last Spirometry:  Failed/intolerant to following asthma meds:  Asthma meds in past:  Aerochamber/spacer use: no Visits to ER or Urgent Care in past year: no Pneumovax: Up to Date Influenza:  declines  HYPERLIPIDEMIA Hyperlipidemia status: poor compliance Satisfied with current treatment?  yes Side effects:  no Medication compliance: poor compliance Past cholesterol meds: statin Supplements: none Aspirin:  no The 10-year ASCVD risk score (Arnett DK, et al., 2019) is: 14.2%   Values used to calculate the score:     Age: 64 years     Sex: Female     Is Non-Hispanic African American: No     Diabetic: No     Tobacco smoker: No     Systolic Blood Pressure: 118 mmHg     Is BP treated: No     HDL Cholesterol: 53 mg/dL     Total Cholesterol: 269 mg/dL Chest pain:  no Coronary artery disease:  no Family history CAD:  no Family history early CAD:  no   Review of Systems  All other systems reviewed and are negative.   Relevant past medical history reviewed and updated as indicated.   Past Medical History:  Diagnosis Date   Allergy    Asthma    Atrophic vaginitis    Osteopenia      Past Surgical History:  Procedure Laterality Date   ABDOMINAL  HYSTERECTOMY     KNEE SURGERY     Right   SKIN CANCER DESTRUCTION N/A 02/14/2022   Dr. Marliss Coots   SPINE SURGERY      Allergies and medications reviewed and updated.   Current Outpatient Medications:    albuterol (PROVENTIL) (2.5 MG/3ML) 0.083% nebulizer solution, Take 3 mLs (2.5 mg total) by nebulization every 6 (six) hours as needed for wheezing or shortness of breath., Disp: 150 mL, Rfl: 1   alendronate (FOSAMAX) 70 MG tablet, Take with a full glass of water on an empty stomach., Disp: 12 tablet, Rfl: 1   Ascorbic Acid (VITAMIN C) 1000 MG tablet, Take 500 mg by mouth daily. , Disp: , Rfl:    Cholecalciferol (VITAMIN D) 125 MCG (5000 UT) CAPS, Take 1 capsule daily, Disp: 30 capsule, Rfl: 0   lidocaine (LINDAMANTLE) 3 % CREA cream, SMARTSIG:Sparingly Topical Daily, Disp: , Rfl:    loratadine (CLARITIN) 10 MG tablet, TAKE 1 TABLET BY MOUTH EVERY DAY, Disp: 30 tablet, Rfl: 0   Meth-Hyo-M Bl-Na Phos-Ph Sal (URO-MP) 118 MG CAPS, Take 1 capsule (118 mg total) by mouth every 6 (six) hours as needed., Disp: 60 capsule, Rfl: PRN   Multiple Vitamin (MULTIVITAMIN) tablet, Take 1 tablet by mouth daily., Disp: , Rfl:    sodium chloride (OCEAN) 0.65 % SOLN nasal spray, Place 1 spray into both nostrils as needed for congestion., Disp: , Rfl:    WIXELA INHUB 250-50 MCG/ACT AEPB, INHALE  1 PUFF INTO THE LUNGS TWICE A DAY, Disp: 60 each, Rfl: 1   albuterol (VENTOLIN HFA) 108 (90 Base) MCG/ACT inhaler, TAKE 2 PUFFS BY MOUTH EVERY 6 HOURS AS NEEDED FOR WHEEZE OR SHORTNESS OF BREATH, Disp: 8.5 each, Rfl: 3   ezetimibe (ZETIA) 10 MG tablet, Take 1 tablet (10 mg total) by mouth daily., Disp: 90 tablet, Rfl: 3  Allergies  Allergen Reactions   Clindamycin/Lincomycin     Itching    Penicillins     REACTION: rash, itching, difficulty breathing   Statins     Objective:   BP 118/72   Pulse 68   Temp 97.8 F (36.6 C) (Oral)   Ht 5\' 8"  (1.727 m)   Wt 163 lb (73.9 kg)   SpO2 98%   BMI 24.78 kg/m       04/15/2023    3:00 PM 12/11/2022    9:18 AM 12/04/2022   11:07 AM  Vitals with BMI  Height 5\' 8"   5\' 8"   Weight 163 lbs  162 lbs  BMI 24.79  24.64  Systolic 118 121 161  Diastolic 72 84 70  Pulse 68 60 67     Physical Exam Vitals and nursing note reviewed.  Constitutional:      Appearance: Normal appearance. She is normal weight.  HENT:     Head: Normocephalic and atraumatic.  Cardiovascular:     Rate and Rhythm: Normal rate and regular rhythm.     Pulses: Normal pulses.     Heart sounds: Normal heart sounds.  Pulmonary:     Effort: Pulmonary effort is normal.     Breath sounds: Normal breath sounds.  Skin:    General: Skin is warm and dry.  Neurological:     General: No focal deficit present.     Mental Status: She is alert and oriented to person, place, and time. Mental status is at baseline.  Psychiatric:        Mood and Affect: Mood normal.        Behavior: Behavior normal.        Thought Content: Thought content normal.        Judgment: Judgment normal.     Assessment & Plan:  Intrinsic asthma Assessment & Plan: Well controlled on Wixela and PRN Albuterol. Refills provided.  Orders: -     Albuterol Sulfate HFA; TAKE 2 PUFFS BY MOUTH EVERY 6 HOURS AS NEEDED FOR WHEEZE OR SHORTNESS OF BREATH  Dispense: 8.5 each; Refill: 3  Encounter for osteoporosis screening in asymptomatic postmenopausal patient Assessment & Plan: DEXA ordered.  Orders: -     DG Bone Density; Future  Mixed hyperlipidemia Assessment & Plan: Encouraged to start Zetia. Will recheck fasting labs prior to upcoming physical. I recommend consuming a heart healthy diet such as Mediterranean diet or DASH diet with whole grains, fruits, vegetable, fish, lean meats, nuts, and olive oil. Limit sweets and processed foods. I also encourage moderate intensity exercise 150 minutes weekly. This is 3-5 times weekly for 30-50 minutes each session. Goal should be pace of 3 miles/hours, or walking 1.5 miles in  30 minutes. The 10-year ASCVD risk score (Arnett DK, et al., 2019) is: 14.2%    Other orders -     Ezetimibe; Take 1 tablet (10 mg total) by mouth daily.  Dispense: 90 tablet; Refill: 3     Follow up plan: Return for AWV and annual physical with labs 1 week prior.  Park Meo, FNP

## 2023-04-15 NOTE — Assessment & Plan Note (Signed)
Encouraged to start Zetia. Will recheck fasting labs prior to upcoming physical. I recommend consuming a heart healthy diet such as Mediterranean diet or DASH diet with whole grains, fruits, vegetable, fish, lean meats, nuts, and olive oil. Limit sweets and processed foods. I also encourage moderate intensity exercise 150 minutes weekly. This is 3-5 times weekly for 30-50 minutes each session. Goal should be pace of 3 miles/hours, or walking 1.5 miles in 30 minutes. The 10-year ASCVD risk score (Arnett DK, et al., 2019) is: 14.2%

## 2023-04-15 NOTE — Assessment & Plan Note (Signed)
Well controlled on Wixela and PRN Albuterol. Refills provided.

## 2023-05-22 NOTE — Progress Notes (Signed)
 Name: RETTA Hays DOB: January 07, 1946 MRN: [id number]  History of Present Illness: Jennifer Hays is a 78 y.o. female who presents today for follow up visit at [facility] [place].  Interstitial cystitis.  At last visit on 12/11/2022: - Doing well. Denied routine pain; reported intermittent IC flare ups primarily related to dietary triggers. - Advised to continue IC diet, urinary analgesics PRN flare ups, Zanaflex PRN for pelvic floor muscle tension (as prescribed elsewhere for fibromyalgia), and antihistamine (daily Claritin).    Today: She reports occasional IC flare ups with painful bladder spasms, primarily if she consumes a dietary trigger. She reports that the Uro MP was unavailable / cost-prohibitive from her pharmacy. She denies acute urinary symptoms today.  Current treatment regimen includes: - Zanaflex as prescribed elsewhere - Loratadine (Claritin) - Follows IC diet  Medications: Current Outpatient Medications  Medication Sig Dispense Refill   albuterol (PROVENTIL) (2.5 MG/3ML) 0.083% nebulizer solution Take 3 mLs (2.5 mg total) by nebulization every 6 (six) hours as needed for wheezing or shortness of breath. 150 mL 1   albuterol (VENTOLIN HFA) 108 (90 Base) MCG/ACT inhaler TAKE 2 PUFFS BY MOUTH EVERY 6 HOURS AS NEEDED FOR WHEEZE OR SHORTNESS OF BREATH 8.5 each 3   alendronate (FOSAMAX) 70 MG tablet Take with a full glass of water on an empty stomach. 12 tablet 1   Ascorbic Acid (VITAMIN C) 1000 MG tablet Take 500 mg by mouth daily.      Cholecalciferol (VITAMIN D) 125 MCG (5000 UT) CAPS Take 1 capsule daily 30 capsule 0   ezetimibe (ZETIA) 10 MG tablet Take 1 tablet (10 mg total) by mouth daily. 90 tablet 3   lidocaine (LINDAMANTLE) 3 % CREA cream SMARTSIG:Sparingly Topical Daily     loratadine (CLARITIN) 10 MG tablet TAKE 1 TABLET BY MOUTH EVERY DAY 30 tablet 0   Multiple Vitamin (MULTIVITAMIN) tablet Take 1 tablet by mouth daily.     oxybutynin (DITROPAN) 5 MG  tablet Take 1 tablet (5 mg total) by mouth every 8 (eight) hours as needed for bladder spasms. 30 tablet 5   phenazopyridine (PYRIDIUM) 100 MG tablet Take 1 tablet (100 mg total) by mouth every 8 (eight) hours as needed for pain. Limit use to no more than 3 days of consecutive use. 30 tablet 3   sodium chloride (OCEAN) 0.65 % SOLN nasal spray Place 1 spray into both nostrils as needed for congestion.     WIXELA INHUB 250-50 MCG/ACT AEPB INHALE 1 PUFF INTO THE LUNGS TWICE A DAY 60 each 1   No current facility-administered medications for this visit.    Allergies: Allergies  Allergen Reactions   Clindamycin/Lincomycin     Itching    Penicillins     REACTION: rash, itching, difficulty breathing   Statins     Past Medical History:  Diagnosis Date   Allergy    Asthma    Atrophic vaginitis    Osteopenia    Past Surgical History:  Procedure Laterality Date   ABDOMINAL HYSTERECTOMY     KNEE SURGERY     Right   SKIN CANCER DESTRUCTION N/A 02/14/2022   Dr. Marliss Coots   SPINE SURGERY     History reviewed. No pertinent family history. Social History   Socioeconomic History   Marital status: Married    Spouse name: Dorinda Hill   Number of children: Not on file   Years of education: Not on file   Highest education level: Not on file  Occupational History  Not on file  Tobacco Use   Smoking status: Former   Smokeless tobacco: Never   Tobacco comments:    quit 1970's  Vaping Use   Vaping status: Never Used  Substance and Sexual Activity   Alcohol use: No    Alcohol/week: 0.0 standard drinks of alcohol   Drug use: No   Sexual activity: Yes  Other Topics Concern   Not on file  Social History Narrative   Retired Runner, broadcasting/film/video.   Social Drivers of Corporate investment banker Strain: Not on file  Food Insecurity: No Food Insecurity (04/03/2022)   Hunger Vital Sign    Worried About Running Out of Food in the Last Year: Never true    Ran Out of Food in the Last Year: Never true   Transportation Needs: No Transportation Needs (04/03/2022)   PRAPARE - Administrator, Civil Service (Medical): No    Lack of Transportation (Non-Medical): No  Physical Activity: Sufficiently Active (04/03/2022)   Exercise Vital Sign    Days of Exercise per Week: 5 days    Minutes of Exercise per Session: 30 min  Stress: No Stress Concern Present (04/03/2022)   [employer] of Occupational Health - Occupational Stress Questionnaire    Feeling of Stress : Not at all  Social Connections: Socially Integrated (04/03/2022)   Social Connection and Isolation Panel [NHANES]    Frequency of Communication with Friends and Family: More than three times a week    Frequency of Social Gatherings with Friends and Family: More than three times a week    Attends Religious Services: More than 4 times per year    Active Member of [employer] or Organizations: Yes    Attends Engineer, structural: More than 4 times per year    Marital Status: Married  Catering manager Violence: Not At Risk (04/03/2022)   Humiliation, Afraid, Rape, and Kick questionnaire    Fear of Current or Ex-Partner: No    Emotionally Abused: No    Physically Abused: No    Sexually Abused: No   SUBJECTIVE  Review of Systems Constitutional: Patient denies any unintentional weight loss or change in strength lntegumentary: Patient denies any rashes or pruritus Cardiovascular: Patient denies chest pain or syncope Respiratory: Patient denies shortness of breath Gastrointestinal: Patient denies nausea / vomiting Musculoskeletal: Patient denies muscle cramps or weakness Neurologic: Patient denies convulsions or seizures Allergic/Immunologic: Patient denies recent allergic reaction(s) Hematologic/Lymphatic: Patient denies bleeding tendencies Endocrine: Patient denies heat/cold intolerance  GU: As per HPI.  OBJECTIVE Vitals:   05/27/23 1029  BP: 123/80  Pulse: 81   There is no height or weight on file to  calculate BMI.  Physical Examination Constitutional: No obvious distress; patient is non-toxic appearing  Cardiovascular: No visible lower extremity edema.  Respiratory: The patient does not have audible wheezing/stridor; respirations do not appear labored  Gastrointestinal: Abdomen non-distended Musculoskeletal: Normal ROM of UEs  Skin: No obvious rashes/open sores  Neurologic: CN 2-12 grossly intact Psychiatric: Answered questions appropriately with normal affect  Hematologic/Lymphatic/Immunologic: No obvious bruises or sites of spontaneous bleeding  UA: Patient did not void in office today - reported urinating prior to arrival PVR: 1 ml  ASSESSMENT IC (interstitial cystitis) - Plan: BLADDER SCAN AMB NON-IMAGING, Urinalysis, Routine w reflex microscopic, oxybutynin (DITROPAN) 5 MG tablet, phenazopyridine (PYRIDIUM) 100 MG tablet  Bladder spasm - Plan: oxybutynin (DITROPAN) 5 MG tablet  We agreed to trial the following for when IC flare ups occur: - Ditropan (Oxybutynin) 5  mg every 8 hours PRN for painful bladder spasms. Also discussed option to take nightly at bedtime to minimize urinary urgency / urge incontinence when she first wakes up in the mornings. We discussed the potential side effects of anticholinergic medications and agreed that if at some point she would like to take something routinely / daily to prevent bladder spasms, urgency, and urge incontinence then it would be preferable to switch to a different medication drug class (beta-3 adrenergic agonist) due to safety profile / side effect risks.  - Pyridium (phenazopyridine) 100 mg every 8 hours PRN. We discussed importance of limiting Pyridium (phenazopyridine) use to 3 days consecutively due to risk for liver injury, methemoglobinemia, and damage to bone health.  We agreed to plan for follow up in 6 months or sooner if needed. Patient verbalized understanding of and agreement with current plan. All questions were  answered.  PLAN Advised the following: Continue to avoid dietary IC triggers as much as possible.  For IC flare ups:  - Pyridium (phenazopyridine) 100 mg every 8 hours PRN. - Ditropan (Oxybutynin) 5 mg every 8 hours PRN. 3. Return in about 6 months (around 11/27/2023) for UA, PVR, & f/u with Evette Georges NP.  Orders Placed This Encounter  Procedures   Urinalysis, Routine w reflex microscopic   BLADDER SCAN AMB NON-IMAGING    It has been explained that the patient is to follow regularly with their PCP in addition to all other providers involved in their care and to follow instructions provided by these respective offices. Patient advised to contact urology clinic if any urologic-pertaining questions, concerns, new symptoms or problems arise in the interim period.  Patient Instructions  Common triggers for IC flare ups: Certain beverages and foods which irritate the bladder This can be very patient specific See IC diet for list of common dietary irritants Physical stress Emotional / psychological stress Activities that put pressure on the pelvic area I perineum (such as sexual intercourse, long car rides, etc) Seasonal environmental allergens Urinary tract infection (UTI). You can call the urology office [phone]) to request lab order for urine testing if desired. May require antibiotic therapy if deemed appropriate based on urine culture result. Post-infectious (post-UTI) bladder inflammation. Can take several days or weeks to resolve.  IC treatment options for flare ups (for on-demand use):  Urinary analgesics for burning / painful urination Over-the-counter medications:  Phenazopyradine (Pyridium). Commonly known under the AZO brand name. Use should be limited to no more than 3 days consecutively due to risk for methemoglobinemia and damage to liver & bone health. Prescription medications: Uribel, Urogesic blue, Uro-MP, Urelle, etc. Can be take every 6 hours as  needed May be cost prohibitive; not covered well by some insurance providers Antihistamine use to minimize histamine-mediated bladder inflammation / pain If taking Hydroxyzine (a prescription antihistamine), may temporarily increase dose at bedtime as directed by provider. Can add on an over-the-counter antihistamine (Zyrtec or Xyzal preferred). Bladder instillations: Placement of a numbing medication (such as Lidocaine + sodium bicarbonate or Bupivacaine) through a catheter into the bladder.  b. Can be done in-office at a nurse visit or at home.   c. Note: These medications are currently in short supply nationwide and may be difficult to obtain until the shortage improves.  Other: If symptoms fail to improve in a timely fashion, could also consider cystoscopy with hydrodistention for potential diagnostic and therapeutic benefit.   Electronically signed by:  Donnita Falls, MSN, FNP-C, CUNP 05/27/2023 12:28 PM

## 2023-05-27 ENCOUNTER — Ambulatory Visit (INDEPENDENT_AMBULATORY_CARE_PROVIDER_SITE_OTHER): Admitting: Urology

## 2023-05-27 ENCOUNTER — Encounter: Payer: Self-pay | Admitting: Urology

## 2023-05-27 VITALS — BP 123/80 | HR 81

## 2023-05-27 DIAGNOSIS — N3010 Interstitial cystitis (chronic) without hematuria: Secondary | ICD-10-CM | POA: Diagnosis not present

## 2023-05-27 DIAGNOSIS — N3289 Other specified disorders of bladder: Secondary | ICD-10-CM

## 2023-05-27 LAB — BLADDER SCAN AMB NON-IMAGING: Scan Result: 1

## 2023-05-27 MED ORDER — PHENAZOPYRIDINE HCL 100 MG PO TABS: 100.0000 mg | ORAL_TABLET | Freq: Three times a day (TID) | ORAL | 3 refills | Status: AC | PRN

## 2023-05-27 MED ORDER — OXYBUTYNIN CHLORIDE 5 MG PO TABS: 5.0000 mg | ORAL_TABLET | Freq: Three times a day (TID) | ORAL | 5 refills | Status: AC | PRN

## 2023-05-27 NOTE — Patient Instructions (Signed)
 Common triggers for IC flare ups: Certain beverages and foods which irritate the bladder This can be very patient specific See IC diet for list of common dietary irritants Physical stress Emotional / psychological stress Activities that put pressure on the pelvic area I perineum (such as sexual intercourse, long car rides, etc) Seasonal environmental allergens Urinary tract infection (UTI). You can call the urology office [phone]) to request lab order for urine testing if desired. May require antibiotic therapy if deemed appropriate based on urine culture result. Post-infectious (post-UTI) bladder inflammation. Can take several days or weeks to resolve.  IC treatment options for flare ups (for on-demand use):  Urinary analgesics for burning / painful urination Over-the-counter medications:  Phenazopyradine (Pyridium). Commonly known under the AZO brand name. Use should be limited to no more than 3 days consecutively due to risk for methemoglobinemia and damage to liver & bone health. Prescription medications: Uribel, Urogesic blue, Uro-MP, Urelle, etc. Can be take every 6 hours as needed May be cost prohibitive; not covered well by some insurance providers Antihistamine use to minimize histamine-mediated bladder inflammation / pain If taking Hydroxyzine (a prescription antihistamine), may temporarily increase dose at bedtime as directed by provider. Can add on an over-the-counter antihistamine (Zyrtec or Xyzal preferred). Bladder instillations: Placement of a numbing medication (such as Lidocaine + sodium bicarbonate or Bupivacaine) through a catheter into the bladder.  b. Can be done in-office at a nurse visit or at home.  c. Note: These medications are currently in short supply nationwide and may be difficult to obtain until the shortage improves.  Other: If symptoms fail to improve in a timely fashion, could also consider cystoscopy with hydrodistention for potential  diagnostic and therapeutic benefit.

## 2023-05-28 ENCOUNTER — Encounter: Payer: Self-pay | Admitting: Family Medicine

## 2023-05-28 ENCOUNTER — Ambulatory Visit (INDEPENDENT_AMBULATORY_CARE_PROVIDER_SITE_OTHER): Payer: Medicare Other | Admitting: Family Medicine

## 2023-05-28 VITALS — BP 112/72 | HR 72 | Temp 98.6°F | Ht 68.0 in | Wt 164.0 lb

## 2023-05-28 DIAGNOSIS — E559 Vitamin D deficiency, unspecified: Secondary | ICD-10-CM | POA: Diagnosis not present

## 2023-05-28 DIAGNOSIS — Z0000 Encounter for general adult medical examination without abnormal findings: Secondary | ICD-10-CM | POA: Insufficient documentation

## 2023-05-28 DIAGNOSIS — J45909 Unspecified asthma, uncomplicated: Secondary | ICD-10-CM | POA: Diagnosis not present

## 2023-05-28 DIAGNOSIS — M797 Fibromyalgia: Secondary | ICD-10-CM

## 2023-05-28 DIAGNOSIS — Z780 Asymptomatic menopausal state: Secondary | ICD-10-CM

## 2023-05-28 DIAGNOSIS — N3010 Interstitial cystitis (chronic) without hematuria: Secondary | ICD-10-CM

## 2023-05-28 DIAGNOSIS — Z0001 Encounter for general adult medical examination with abnormal findings: Secondary | ICD-10-CM

## 2023-05-28 DIAGNOSIS — E782 Mixed hyperlipidemia: Secondary | ICD-10-CM | POA: Diagnosis not present

## 2023-05-28 DIAGNOSIS — Z13220 Encounter for screening for lipoid disorders: Secondary | ICD-10-CM

## 2023-05-28 DIAGNOSIS — Z789 Other specified health status: Secondary | ICD-10-CM

## 2023-05-28 NOTE — Assessment & Plan Note (Signed)
 CBC

## 2023-05-28 NOTE — Patient Instructions (Signed)
  Ms. Bednarski , Thank you for taking time to come for your Medicare Wellness Visit. I appreciate your ongoing commitment to your health goals. Please review the following plan we discussed and let me know if I can assist you in the future.   These are the goals we discussed:  Goals      DIET - REDUCE CALORIE INTAKE     Pt would like to reduce cholesterol.     Exercise 3x per week (30 min per time)     Continue to exercise daily.         This is a list of the screening recommended for you and due dates:  Health Maintenance  Topic Date Due   Zoster (Shingles) Vaccine (1 of 2) Never done   Flu Shot  06/02/2023*   COVID-19 Vaccine (3 - 2024-25 season) 06/13/2023*   Medicare Annual Wellness Visit  05/27/2024   Cologuard (Stool DNA test)  09/01/2025   Pneumonia Vaccine  Completed   DEXA scan (bone density measurement)  Completed   Hepatitis C Screening  Completed   HPV Vaccine  Aged Out   DTaP/Tdap/Td vaccine  Discontinued  *Topic was postponed. The date shown is not the original due date.

## 2023-05-28 NOTE — Assessment & Plan Note (Signed)
 Chronic pain. PRN Tylenol and topical lidocaine. Followed by orthopedics for joint pain.

## 2023-05-28 NOTE — Assessment & Plan Note (Signed)
 Well controlled on Wixela and PRN Albuterol. Refills provided.

## 2023-05-28 NOTE — Assessment & Plan Note (Signed)
Today your medical history was reviewed and routine physical exam with labs was performed. Recommend 150 minutes of moderate intensity exercise weekly and consuming a well-balanced diet. Advised to stop smoking if a smoker, avoid smoking if a non-smoker, limit alcohol consumption to 1 drink per day for women and 2 drinks per day for men, and avoid illicit drug use. Counseled on the importance of sunscreen use. Counseled in mental health awareness and when to seek medical care. Vaccine maintenance discussed. Appropriate health maintenance items reviewed. Return to office in 1 year for annual physical exam.

## 2023-05-28 NOTE — Assessment & Plan Note (Signed)
 Continue Zetia 10mg  daily. Fasting labs today.  Will recheck fasting labs prior to upcoming physical. I recommend consuming a heart healthy diet such as Mediterranean diet or DASH diet with whole grains, fruits, vegetable, fish, lean meats, nuts, and olive oil. Limit sweets and processed foods. I also encourage moderate intensity exercise 150 minutes weekly. This is 3-5 times weekly for 30-50 minutes each session. Goal should be pace of 3 miles/hours, or walking 1.5 miles in 30 minutes. The 10-year ASCVD risk score (Arnett DK, et al., 2019) is: 12.9%

## 2023-05-28 NOTE — Progress Notes (Signed)
 Complete physical exam  Patient: Jennifer Hays   DOB: [birth year]   78 y.o. Female  MRN: [id number]  Subjective:    Chief Complaint  Patient presents with   Medicare Wellness    Jennifer Hays is a 78 y.o. female who presents today for a complete physical exam. She reports consuming a general diet. Home exercise routine includes walking 0.5 hrs per day. She generally feels well. She reports sleeping well. She does not have additional problems to discuss today.   Ms Corlett continues to see Urology and Orthopedics.   Most recent fall risk assessment:    05/28/2023    2:20 PM  Fall Risk   Falls in the past year? 0  Number falls in past yr: 0  Injury with Fall? 0  Risk for fall due to : No Fall Risks  Follow up Falls prevention discussed;Falls evaluation completed     Most recent depression screenings:    05/28/2023    2:14 PM 04/15/2023    3:28 PM  PHQ 2/9 Scores  PHQ - 2 Score 0 0  PHQ- 9 Score 0 1    Vision:Within last year and Dental: No current dental problems and Receives regular dental care  Patient Active Problem List   Diagnosis Date Noted   Encounter for Medicare annual wellness exam 05/28/2023   Physical exam, annual 05/28/2023   Vegan diet 12/11/2022   Hip joint instability, unspecified laterality 12/10/2022   Swelling, mass, or lump on face 06/06/2022   Vitamin D deficiency 08/05/2018   IC (interstitial cystitis) 02/17/2018   Osteoporosis 05/12/2013   Hyperlipidemia 01/19/2013   Intrinsic asthma 08/10/2012   ALLERGIC RHINITIS 09/10/2007   GERD 09/10/2007   Fibromyalgia 09/10/2007   Past Medical History:  Diagnosis Date   Allergy    Asthma    Atrophic vaginitis    Osteopenia    Past Surgical History:  Procedure Laterality Date   ABDOMINAL HYSTERECTOMY     KNEE SURGERY     Right   SKIN CANCER DESTRUCTION N/A 02/14/2022   Dr. Marliss Coots   SPINE SURGERY     Social History   Tobacco Use   Smoking status: Former   Smokeless tobacco: Never    Tobacco comments:    quit 1970's  Vaping Use   Vaping status: Never Used  Substance Use Topics   Alcohol use: No    Alcohol/week: 0.0 standard drinks of alcohol   Drug use: No   History reviewed. No pertinent family history. Allergies  Allergen Reactions   Clindamycin/Lincomycin     Itching    Penicillins     REACTION: rash, itching, difficulty breathing   Statins       Patient Care Team: Park Meo, FNP as PCP - General (Family Medicine) Wyline Mood Dorothe Pea, MD as PCP - Cardiology (Cardiology)   Outpatient Medications Prior to Visit  Medication Sig   albuterol (PROVENTIL) (2.5 MG/3ML) 0.083% nebulizer solution Take 3 mLs (2.5 mg total) by nebulization every 6 (six) hours as needed for wheezing or shortness of breath.   albuterol (VENTOLIN HFA) 108 (90 Base) MCG/ACT inhaler TAKE 2 PUFFS BY MOUTH EVERY 6 HOURS AS NEEDED FOR WHEEZE OR SHORTNESS OF BREATH   Ascorbic Acid (VITAMIN C) 1000 MG tablet Take 500 mg by mouth daily.    Cholecalciferol (VITAMIN D) 125 MCG (5000 UT) CAPS Take 1 capsule daily   ezetimibe (ZETIA) 10 MG tablet Take 1 tablet (10 mg total) by mouth daily.   lidocaine (  LINDAMANTLE) 3 % CREA cream SMARTSIG:Sparingly Topical Daily   loratadine (CLARITIN) 10 MG tablet TAKE 1 TABLET BY MOUTH EVERY DAY   Multiple Vitamin (MULTIVITAMIN) tablet Take 1 tablet by mouth daily.   oxybutynin (DITROPAN) 5 MG tablet Take 1 tablet (5 mg total) by mouth every 8 (eight) hours as needed for bladder spasms.   phenazopyridine (PYRIDIUM) 100 MG tablet Take 1 tablet (100 mg total) by mouth every 8 (eight) hours as needed for pain. Limit use to no more than 3 days of consecutive use.   sodium chloride (OCEAN) 0.65 % SOLN nasal spray Place 1 spray into both nostrils as needed for congestion.   WIXELA INHUB 250-50 MCG/ACT AEPB INHALE 1 PUFF INTO THE LUNGS TWICE A DAY   alendronate (FOSAMAX) 70 MG tablet Take with a full glass of water on an empty stomach. (Patient not taking:  Reported on 05/28/2023)   No facility-administered medications prior to visit.    Review of Systems  Constitutional: Negative.   HENT: Negative.    Eyes: Negative.   Respiratory: Negative.    Cardiovascular: Negative.   Gastrointestinal: Negative.   Genitourinary: Negative.   Musculoskeletal:  Positive for myalgias.  Skin: Negative.   Neurological: Negative.   Endo/Heme/Allergies: Negative.   Psychiatric/Behavioral: Negative.    All other systems reviewed and are negative.         Objective:     BP 112/72   Pulse 72   Temp 98.6 F (37 C)   Ht 5\' 8"  (1.727 m)   Wt 164 lb (74.4 kg)   SpO2 98%   BMI 24.94 kg/m  BP Readings from Last 3 Encounters:  05/28/23 112/72  05/27/23 123/80  04/15/23 118/72   Wt Readings from Last 3 Encounters:  05/28/23 164 lb (74.4 kg)  04/15/23 163 lb (73.9 kg)  12/04/22 162 lb (73.5 kg)      Physical Exam Vitals and nursing note reviewed.  Constitutional:      Appearance: Normal appearance. She is normal weight.  HENT:     Head: Normocephalic and atraumatic.     Right Ear: Tympanic membrane, ear canal and external ear normal.     Left Ear: Tympanic membrane, ear canal and external ear normal.     Nose: Nose normal.     Mouth/Throat:     Mouth: Mucous membranes are moist.     Pharynx: Oropharynx is clear.  Eyes:     Extraocular Movements: Extraocular movements intact.     Conjunctiva/sclera: Conjunctivae normal.     Pupils: Pupils are equal, round, and reactive to light.  Cardiovascular:     Rate and Rhythm: Normal rate and regular rhythm.     Pulses: Normal pulses.     Heart sounds: Normal heart sounds.  Pulmonary:     Effort: Pulmonary effort is normal.     Breath sounds: Normal breath sounds.  Abdominal:     General: Bowel sounds are normal.     Palpations: Abdomen is soft.  Musculoskeletal:        General: Normal range of motion.     Cervical back: Normal range of motion and neck supple.  Skin:    General: Skin  is warm and dry.     Capillary Refill: Capillary refill takes less than 2 seconds.  Neurological:     General: No focal deficit present.     Mental Status: She is alert and oriented to person, place, and time. Mental status is at baseline.  Psychiatric:  Mood and Affect: Mood normal.        Behavior: Behavior normal.        Thought Content: Thought content normal.        Judgment: Judgment normal.      No results found for any visits on 05/28/23. Last CBC Lab Results  Component Value Date   WBC 4.1 12/04/2022   HGB 14.1 12/04/2022   HCT 43.5 12/04/2022   MCV 89.0 12/04/2022   MCH 28.8 12/04/2022   RDW 13.0 12/04/2022   PLT 256 12/04/2022   Last metabolic panel Lab Results  Component Value Date   GLUCOSE 90 12/04/2022   NA 140 12/04/2022   K 4.3 12/04/2022   CL 104 12/04/2022   CO2 26 12/04/2022   BUN 10 12/04/2022   CREATININE 0.65 12/04/2022   EGFR 92 12/04/2022   CALCIUM 9.8 12/04/2022   PROT 6.7 12/04/2022   ALBUMIN 4.1 03/12/2016   BILITOT 0.6 12/04/2022   ALKPHOS 63 03/12/2016   AST 16 12/04/2022   ALT 12 12/04/2022   Last lipids Lab Results  Component Value Date   CHOL 269 (H) 12/04/2022   HDL 53 12/04/2022   LDLCALC 185 (H) 12/04/2022   TRIG 160 (H) 12/04/2022   CHOLHDL 5.1 (H) 12/04/2022   Last hemoglobin A1c No results found for: "HGBA1C" Last thyroid functions Lab Results  Component Value Date   TSH 1.12 08/10/2019   Last vitamin D Lab Results  Component Value Date   VD25OH 52 12/04/2022   Last vitamin B12 and Folate No results found for: "VITAMINB12", "FOLATE"      Assessment & Plan:    Routine Health Maintenance and Physical Exam  Immunization History  Administered Date(s) Administered   Fluad Quad(high Dose 65+) 12/03/2018   H1N1 01/10/2008   Influenza, High Dose Seasonal PF 12/03/2017, 11/09/2019   Influenza,inj,Quad PF,6+ Mos 01/05/2013, 01/06/2014, 11/14/2014   Influenza-Unspecified 12/27/2010, 12/20/2015, 12/02/2021    Moderna Sars-Covid-2 Vaccination 11/09/2019, 12/09/2019   Pneumococcal Conjugate-13 01/18/2013   Pneumococcal Polysaccharide-23 12/16/2005, 05/05/2017   Td 09/26/2011   Tdap 09/26/2011    Health Maintenance  Topic Date Due   Zoster Vaccines- Shingrix (1 of 2) Never done   INFLUENZA VACCINE  06/02/2023 (Originally 10/03/2022)   COVID-19 Vaccine (3 - 2024-25 season) 06/13/2023 (Originally 11/03/2022)   Medicare Annual Wellness (AWV)  05/27/2024   Fecal DNA (Cologuard)  09/01/2025   Pneumonia Vaccine 72+ Years old  Completed   DEXA SCAN  Completed   Hepatitis C Screening  Completed   HPV VACCINES  Aged Out   DTaP/Tdap/Td  Discontinued    Discussed health benefits of physical activity, and encouraged her to engage in regular exercise appropriate for her age and condition.  Problem List Items Addressed This Visit     Fibromyalgia   Chronic pain. PRN Tylenol and topical lidocaine. Followed by orthopedics for joint pain.      Intrinsic asthma   Well controlled on Wixela and PRN Albuterol. Refills provided.      Relevant Orders   CBC with Differential/Platelet   Hyperlipidemia   Continue Zetia 10mg  daily. Fasting labs today.  Will recheck fasting labs prior to upcoming physical. I recommend consuming a heart healthy diet such as Mediterranean diet or DASH diet with whole grains, fruits, vegetable, fish, lean meats, nuts, and olive oil. Limit sweets and processed foods. I also encourage moderate intensity exercise 150 minutes weekly. This is 3-5 times weekly for 30-50 minutes each session. Goal should be pace of 3  miles/hours, or walking 1.5 miles in 30 minutes. The 10-year ASCVD risk score (Arnett DK, et al., 2019) is: 12.9%       IC (interstitial cystitis)   Followed by Urology. Continue to avoid dietary IC triggers as much as possible. PRN Pyridium (phenazopyridine) 100 mg every 8 hours and Ditropan (Oxybutynin) 5 mg every 8 hours       Vitamin D deficiency   Vitamin D level.  Currently taking 5000 UT daily      Relevant Orders   VITAMIN D 25 Hydroxy (Vit-D Deficiency, Fractures)   Vegan diet   CBC      Relevant Orders   CBC with Differential/Platelet   Encounter for Medicare annual wellness exam - Primary   Physical exam, annual   Today your medical history was reviewed and routine physical exam with labs was performed. Recommend 150 minutes of moderate intensity exercise weekly and consuming a well-balanced diet. Advised to stop smoking if a smoker, avoid smoking if a non-smoker, limit alcohol consumption to 1 drink per day for women and 2 drinks per day for men, and avoid illicit drug use. Counseled on the importance of sunscreen use. Counseled in mental health awareness and when to seek medical care. Vaccine maintenance discussed. Appropriate health maintenance items reviewed. Return to office in 1 year for annual physical exam.       Relevant Orders   CBC with Differential/Platelet   COMPLETE METABOLIC PANEL WITH GFR   Lipid panel   VITAMIN D 25 Hydroxy (Vit-D Deficiency, Fractures)   DG Bone Density   Other Visit Diagnoses       Screening for lipoid disorders       Relevant Orders   Lipid panel     Encounter for osteoporosis screening in asymptomatic postmenopausal patient       Relevant Orders   DG Bone Density      Return in 1 year (on 05/27/2024).     Park Meo, FNP

## 2023-05-28 NOTE — Progress Notes (Signed)
 Subjective:   Jennifer Hays is a 78 y.o. female who presents for Medicare Annual (Subsequent) preventive examination.  Visit Complete: In person  Patient Medicare AWV questionnaire was completed by the patient on 05/28/2023; I have confirmed that all information answered by patient is correct and no changes since this date.  Cardiac Risk Factors include: advanced age (>61men, >34 women);dyslipidemia;hypertension     Objective:    Today's Vitals   05/28/23 1404 05/28/23 1409  BP: 112/72   Pulse: 72   Temp: 98.6 F (37 C)   SpO2: 98%   Weight: 164 lb (74.4 kg)   Height: 5\' 8"  (1.727 m)   PainSc:  6    Body mass index is 24.94 kg/m.     05/28/2023    2:20 PM 04/03/2022    2:09 PM 09/13/2020    4:51 PM 08/10/2019   10:48 AM 08/05/2018    3:02 PM 07/17/2016    1:13 PM  Advanced Directives  Does Patient Have a Medical Advance Directive? Yes Yes No Yes Yes Yes  Type of Estate agent of [place];Living will Healthcare Power of [place];Living will   Healthcare Power of [place];Living will;Out of facility DNR (pink MOST or yellow form)   Does patient want to make changes to medical advance directive?    No - Patient declined  No - Patient declined  Copy of Healthcare Power of Attorney in Chart? No - copy requested No - copy requested      Would patient like information on creating a medical advance directive?   No - Patient declined       Current Medications (verified) Outpatient Encounter Medications as of 05/28/2023  Medication Sig   albuterol (PROVENTIL) (2.5 MG/3ML) 0.083% nebulizer solution Take 3 mLs (2.5 mg total) by nebulization every 6 (six) hours as needed for wheezing or shortness of breath.   albuterol (VENTOLIN HFA) 108 (90 Base) MCG/ACT inhaler TAKE 2 PUFFS BY MOUTH EVERY 6 HOURS AS NEEDED FOR WHEEZE OR SHORTNESS OF BREATH   Ascorbic Acid (VITAMIN C) 1000 MG tablet Take 500 mg by mouth daily.    Cholecalciferol (VITAMIN D) 125 MCG (5000 UT) CAPS  Take 1 capsule daily   ezetimibe (ZETIA) 10 MG tablet Take 1 tablet (10 mg total) by mouth daily.   lidocaine (LINDAMANTLE) 3 % CREA cream SMARTSIG:Sparingly Topical Daily   loratadine (CLARITIN) 10 MG tablet TAKE 1 TABLET BY MOUTH EVERY DAY   Multiple Vitamin (MULTIVITAMIN) tablet Take 1 tablet by mouth daily.   oxybutynin (DITROPAN) 5 MG tablet Take 1 tablet (5 mg total) by mouth every 8 (eight) hours as needed for bladder spasms.   phenazopyridine (PYRIDIUM) 100 MG tablet Take 1 tablet (100 mg total) by mouth every 8 (eight) hours as needed for pain. Limit use to no more than 3 days of consecutive use.   sodium chloride (OCEAN) 0.65 % SOLN nasal spray Place 1 spray into both nostrils as needed for congestion.   WIXELA INHUB 250-50 MCG/ACT AEPB INHALE 1 PUFF INTO THE LUNGS TWICE A DAY   alendronate (FOSAMAX) 70 MG tablet Take with a full glass of water on an empty stomach. (Patient not taking: Reported on 05/28/2023)   No facility-administered encounter medications on file as of 05/28/2023.    Allergies (verified) Clindamycin/lincomycin, Penicillins, and Statins   History: Past Medical History:  Diagnosis Date   Allergy    Asthma    Atrophic vaginitis    Osteopenia    Past Surgical History:  Procedure Laterality Date   ABDOMINAL HYSTERECTOMY     KNEE SURGERY     Right   SKIN CANCER DESTRUCTION N/A 02/14/2022   Dr. Marliss Coots   SPINE SURGERY     History reviewed. No pertinent family history. Social History   Socioeconomic History   Marital status: Married    Spouse name: Dorinda Hill   Number of children: 5   Years of education: Not on file   Highest education level: Not on file  Occupational History   Not on file  Tobacco Use   Smoking status: Former   Smokeless tobacco: Never   Tobacco comments:    quit 1970's  Vaping Use   Vaping status: Never Used  Substance and Sexual Activity   Alcohol use: No    Alcohol/week: 0.0 standard drinks of alcohol   Drug use: No   Sexual  activity: Yes  Other Topics Concern   Not on file  Social History Narrative   Retired Runner, broadcasting/film/video.   14 grand-children    Social Drivers of Corporate investment banker Strain: Low Risk  (05/28/2023)   Overall Financial Resource Strain (CARDIA)    Difficulty of Paying Living Expenses: Not hard at all  Food Insecurity: No Food Insecurity (05/28/2023)   Hunger Vital Sign    Worried About Running Out of Food in the Last Year: Never true    Ran Out of Food in the Last Year: Never true  Transportation Needs: No Transportation Needs (05/28/2023)   PRAPARE - Administrator, Civil Service (Medical): No    Lack of Transportation (Non-Medical): No  Physical Activity: Insufficiently Active (05/28/2023)   Exercise Vital Sign    Days of Exercise per Week: 7 days    Minutes of Exercise per Session: 20 min  Stress: No Stress Concern Present (05/28/2023)   [employer] of Occupational Health - Occupational Stress Questionnaire    Feeling of Stress : Not at all  Social Connections: Socially Integrated (05/28/2023)   Social Connection and Isolation Panel [NHANES]    Frequency of Communication with Friends and Family: More than three times a week    Frequency of Social Gatherings with Friends and Family: More than three times a week    Attends Religious Services: More than 4 times per year    Active Member of [employer] or Organizations: Yes    Attends Engineer, structural: More than 4 times per year    Marital Status: Married    Tobacco Counseling Counseling given: Not Answered Tobacco comments: quit 1970's   Clinical Intake:  Pre-visit preparation completed: Yes  Pain : 0-10 Pain Score: 6  Pain Type: Chronic pain Pain Descriptors / Indicators: Aching Pain Onset: More than a month ago Pain Frequency: Constant     BMI - recorded: 24.94 Nutritional Status: BMI of 19-24  Normal Nutritional Risks: None Diabetes: No  How often do you need to have someone help you when  you read instructions, pamphlets, or other written materials from your doctor or pharmacy?: 1 - Never  Interpreter Needed?: No  Information entered by :: mj perdue, lpn   Activities of Daily Living    05/28/2023    2:20 PM  In your present state of health, do you have any difficulty performing the following activities:  Hearing? 0  Vision? 0  Difficulty concentrating or making decisions? 0  Walking or climbing stairs? 0  Dressing or bathing? 0  Doing errands, shopping? 0  Preparing Food and eating ? N  Using the Toilet? N  In the past six months, have you accidently leaked urine? N  Do you have problems with loss of bowel control? N  Managing your Medications? N  Managing your Finances? N  Housekeeping or managing your Housekeeping? N    Patient Care Team: Park Meo, FNP as PCP - General (Family Medicine) Wyline Mood Dorothe Pea, MD as PCP - Cardiology (Cardiology)  Indicate any recent Medical Services you may have received from other than [facility] providers in the past year (date may be approximate).     Assessment:   This is a routine wellness examination for [place].  Hearing/Vision screen Hearing Screening - Comments:: No hearing issues Vision Screening - Comments:: Glasses. [facility] MD   Goals Addressed             This Visit's Progress    Exercise 3x per week (30 min per time)       Continue to exercise daily.        Depression Screen    05/28/2023    2:14 PM 04/15/2023    3:28 PM 04/03/2022    2:03 PM 11/09/2021    9:16 AM 04/26/2020   12:05 PM 02/09/2020   11:26 AM 08/10/2019   10:44 AM  PHQ 2/9 Scores  PHQ - 2 Score 0 0 0 0 0 0 0  PHQ- 9 Score 0 1  0   2    Fall Risk    05/28/2023    2:20 PM 04/03/2022    2:12 PM 11/09/2021    9:04 AM 11/09/2021    8:57 AM 04/26/2020   12:05 PM  Fall Risk   Falls in the past year? 0 0 1 0 1  Number falls in past yr: 0 0 0 0 1  Injury with Fall? 0 0 1 0 0  Comment   feels sore    Risk for fall due to : No Fall Risks No  Fall Risks     Follow up Falls prevention discussed;Falls evaluation completed Falls prevention discussed   Falls evaluation completed    MEDICARE RISK AT HOME:    TIMED UP AND GO:  Was the test performed?  Yes  Length of time to ambulate 10 feet: 8 sec Gait steady and fast with assistive device    Cognitive Function:        05/28/2023    2:21 PM 04/03/2022    2:15 PM  6CIT Screen  What Year? 0 points 0 points  What month? 0 points 0 points  What time? 0 points 0 points  Count back from 20 0 points 0 points  Months in reverse 2 points 0 points  Repeat phrase 0 points 0 points  Total Score 2 points 0 points    Immunizations Immunization History  Administered Date(s) Administered   Fluad Quad(high Dose 65+) 12/03/2018   H1N1 01/10/2008   Influenza, High Dose Seasonal PF 12/03/2017, 11/09/2019   Influenza,inj,Quad PF,6+ Mos 01/05/2013, 01/06/2014, 11/14/2014   Influenza-Unspecified 12/27/2010, 12/20/2015, 12/02/2021   Moderna Sars-Covid-2 Vaccination 11/09/2019, 12/09/2019   Pneumococcal Conjugate-13 01/18/2013   Pneumococcal Polysaccharide-23 12/16/2005, 05/05/2017   Td 09/26/2011   Tdap 09/26/2011    TDAP status: Due, Education has been provided regarding the importance of this vaccine. Advised may receive this vaccine at local pharmacy or Health Dept. Aware to provide a copy of the vaccination record if obtained from local pharmacy or Health Dept. Verbalized acceptance and understanding.  Flu Vaccine status: Up to date  Pneumococcal  vaccine status: Up to date  Covid-19 vaccine status: Completed vaccines  Qualifies for Shingles Vaccine? Yes   Zostavax completed No   Shingrix Completed?: No.    Education has been provided regarding the importance of this vaccine. Patient has been advised to call insurance company to determine out of pocket expense if they have not yet received this vaccine. Advised may also receive vaccine at local pharmacy or Health Dept.  Verbalized acceptance and understanding.  Screening Tests Health Maintenance  Topic Date Due   Zoster Vaccines- Shingrix (1 of 2) Never done   INFLUENZA VACCINE  06/02/2023 (Originally 10/03/2022)   COVID-19 Vaccine (3 - 2024-25 season) 06/13/2023 (Originally 11/03/2022)   Medicare Annual Wellness (AWV)  05/27/2024   Fecal DNA (Cologuard)  09/01/2025   Pneumonia Vaccine 18+ Years old  Completed   DEXA SCAN  Completed   Hepatitis C Screening  Completed   HPV VACCINES  Aged Out   DTaP/Tdap/Td  Discontinued    Health Maintenance  Health Maintenance Due  Topic Date Due   Zoster Vaccines- Shingrix (1 of 2) Never done    Colorectal cancer screening: Type of screening: Cologuard. Completed 09/02/2022. Repeat every 3 years  Mammogram status: Ordered Pt has reminder from [facility] to schedule. Marland Kitchen Pt provided with contact info and advised to call to schedule appt.   Bone Density status: Completed 08/13/2019. Results reflect: Bone density results: OSTEOPOROSIS. Repeat every 2 years.  Lung Cancer Screening: (Low Dose CT Chest recommended if Age 82-80 years, 20 pack-year currently smoking OR have quit w/in 15years.) does not qualify.   Lung Cancer Screening Referral: N/A  Additional Screening:  Hepatitis C Screening: does qualify; Completed 03/08/2015  Vision Screening: Recommended annual ophthalmology exams for early detection of glaucoma and other disorders of the eye. Is the patient up to date with their annual eye exam?  Yes  Who is the provider or what is the name of the office in which the patient attends annual eye exams? My eye doctor If pt is not established with a provider, would they like to be referred to a provider to establish care? No .   Dental Screening: Recommended annual dental exams for proper oral hygiene  Diabetic Foot Exam: N/A  Community Resource Referral / Chronic Care Management: CRR required this visit?  No   CCM required this visit?  No     Plan:     I  have personally reviewed and noted the following in the patient's chart:   Medical and social history Use of alcohol, tobacco or illicit drugs  Current medications and supplements including opioid prescriptions. Patient is not currently taking opioid prescriptions. Functional ability and status Nutritional status Physical activity Advanced directives List of other physicians Hospitalizations, surgeries, and ER visits in previous 12 months Vitals Screenings to include cognitive, depression, and falls Referrals and appointments  In addition, I have reviewed and discussed with patient certain preventive protocols, quality metrics, and best practice recommendations. A written personalized care plan for preventive services as well as general preventive health recommendations were provided to patient.     Park Meo, FNP   05/28/2023   After Visit Summary: (In Person-Printed) AVS printed and given to the patient  Nurse Notes: Discussed shingles and tdap vaccines.

## 2023-05-28 NOTE — Assessment & Plan Note (Signed)
 Vitamin D level. Currently taking 5000 UT daily

## 2023-05-28 NOTE — Assessment & Plan Note (Signed)
 Followed by Urology. Continue to avoid dietary IC triggers as much as possible. PRN Pyridium (phenazopyridine) 100 mg every 8 hours and Ditropan (Oxybutynin) 5 mg every 8 hours

## 2023-05-29 ENCOUNTER — Other Ambulatory Visit (HOSPITAL_COMMUNITY): Payer: Self-pay | Admitting: Family Medicine

## 2023-05-29 ENCOUNTER — Encounter: Payer: Self-pay | Admitting: Family Medicine

## 2023-05-29 DIAGNOSIS — Z1231 Encounter for screening mammogram for malignant neoplasm of breast: Secondary | ICD-10-CM

## 2023-05-29 LAB — COMPLETE METABOLIC PANEL WITHOUT GFR
AG Ratio: 1.6 (calc) (ref 1.0–2.5)
ALT: 22 U/L (ref 6–29)
AST: 23 U/L (ref 10–35)
Albumin: 4.2 g/dL (ref 3.6–5.1)
Alkaline phosphatase (APISO): 89 U/L (ref 37–153)
BUN/Creatinine Ratio: 21 (calc) (ref 6–22)
BUN: 12 mg/dL (ref 7–25)
CO2: 29 mmol/L (ref 20–32)
Calcium: 9.4 mg/dL (ref 8.6–10.4)
Chloride: 105 mmol/L (ref 98–110)
Creat: 0.57 mg/dL — ABNORMAL LOW (ref 0.60–1.00)
Globulin: 2.6 g/dL (ref 1.9–3.7)
Glucose, Bld: 90 mg/dL (ref 65–99)
Potassium: 4.3 mmol/L (ref 3.5–5.3)
Sodium: 141 mmol/L (ref 135–146)
Total Bilirubin: 0.7 mg/dL (ref 0.2–1.2)
Total Protein: 6.8 g/dL (ref 6.1–8.1)

## 2023-05-29 LAB — CBC WITH DIFFERENTIAL/PLATELET
Absolute Lymphocytes: 1532 {cells}/uL (ref 850–3900)
Absolute Monocytes: 409 {cells}/uL (ref 200–950)
Basophils Absolute: 38 {cells}/uL (ref 0–200)
Basophils Relative: 0.8 %
Eosinophils Absolute: 61 {cells}/uL (ref 15–500)
Eosinophils Relative: 1.3 %
HCT: 43.2 % (ref 35.0–45.0)
Hemoglobin: 14.3 g/dL (ref 11.7–15.5)
MCH: 29.1 pg (ref 27.0–33.0)
MCHC: 33.1 g/dL (ref 32.0–36.0)
MCV: 88 fL (ref 80.0–100.0)
MPV: 12.3 fL (ref 7.5–12.5)
Monocytes Relative: 8.7 %
Neutro Abs: 2660 {cells}/uL (ref 1500–7800)
Neutrophils Relative %: 56.6 %
Platelets: 225 10*3/uL (ref 140–400)
RBC: 4.91 10*6/uL (ref 3.80–5.10)
RDW: 13.6 % (ref 11.0–15.0)
Total Lymphocyte: 32.6 %
WBC: 4.7 10*3/uL (ref 3.8–10.8)

## 2023-05-29 LAB — LIPID PANEL
Cholesterol: 284 mg/dL — ABNORMAL HIGH (ref <200)
HDL: 56 mg/dL (ref >=50)
LDL Cholesterol (Calc): 199 mg/dL — ABNORMAL HIGH
Non-HDL Cholesterol (Calc): 228 mg/dL — ABNORMAL HIGH (ref <130)
Total CHOL/HDL Ratio: 5.1 (calc) — ABNORMAL HIGH (ref <5.0)
Triglycerides: 137 mg/dL (ref <150)

## 2023-05-29 LAB — VITAMIN D 25 HYDROXY (VIT D DEFICIENCY, FRACTURES): Vit D, 25-Hydroxy: 58 ng/mL (ref 30–100)

## 2023-06-04 ENCOUNTER — Ambulatory Visit (HOSPITAL_COMMUNITY)
Admission: RE | Admit: 2023-06-04 | Discharge: 2023-06-04 | Disposition: A | Discharge status: Home / Self Care | Source: Ambulatory Visit | Attending: Family Medicine | Admitting: Family Medicine

## 2023-06-04 ENCOUNTER — Encounter (HOSPITAL_COMMUNITY): Payer: Self-pay

## 2023-06-04 DIAGNOSIS — Z1231 Encounter for screening mammogram for malignant neoplasm of breast: Secondary | ICD-10-CM | POA: Diagnosis present

## 2023-06-04 DIAGNOSIS — Z0000 Encounter for general adult medical examination without abnormal findings: Secondary | ICD-10-CM

## 2023-06-04 DIAGNOSIS — Z780 Asymptomatic menopausal state: Secondary | ICD-10-CM | POA: Insufficient documentation

## 2023-06-04 DIAGNOSIS — Z13820 Encounter for screening for osteoporosis: Secondary | ICD-10-CM | POA: Diagnosis present

## 2023-06-05 ENCOUNTER — Telehealth: Payer: Self-pay

## 2023-06-05 NOTE — Telephone Encounter (Signed)
 Copied from CRM [id number]. Topic: General - Other >> Jun 05, 2023 10:30 AM Emylou G wrote: Reason for CRM: Patient adv she is taking Zetia since last week.. responding about mychart message.. would like info on the repatha injectable.. Pls give her call [phone]

## 2023-06-05 NOTE — Telephone Encounter (Signed)
 Copied from CRM [id number]. Topic: Clinical - Medical Advice >> Jun 05, 2023  2:10 PM Marland Kitchen D wrote: Jennifer Hays to ortho doctor on Tuesday they want her to get a cortisol shot in right hip. She takes cholesterol shot and want to know if it will make her sick or cause any problems.

## 2023-06-12 ENCOUNTER — Ambulatory Visit: Payer: BC Managed Care – PPO | Admitting: Urology

## 2023-06-23 ENCOUNTER — Telehealth: Payer: Self-pay

## 2023-06-23 NOTE — Telephone Encounter (Signed)
 Pt has an [employer] and also a workers Visual merchandiser. The [name] is not entered into the patient's chart but it is scanned in under media. Pt also states that she has a workers Visual merchandiser and will bring the information by for her chart. Thanks.   Copied from CRM [id number]. Topic: General - Billing Inquiry >> Jun 23, 2023 11:21 AM Jennifer Hays wrote: Reason for CRM: Patient calling to confirm if her worker's comp information for billing purposes with [facility]. Please contact patient at [phone].

## 2023-08-07 ENCOUNTER — Ambulatory Visit (INDEPENDENT_AMBULATORY_CARE_PROVIDER_SITE_OTHER): Payer: BC Managed Care – PPO | Admitting: *Deleted

## 2023-08-07 DIAGNOSIS — Z0000 Encounter for general adult medical examination without abnormal findings: Secondary | ICD-10-CM

## 2023-08-07 NOTE — Progress Notes (Signed)
 Subjective:   Jennifer Hays is a 78 y.o. female who presents for Medicare Annual (Subsequent) preventive examination.  Visit Complete: Virtual I connected with  NYLEE BARBUTO on 08/07/23 by a audio enabled telemedicine application and verified that I am speaking with the correct person using two identifiers.  Patient Location: Home  Provider Location: Home Office  I discussed the limitations of evaluation and management by telemedicine. The patient expressed understanding and agreed to proceed.  Vital Signs: Because this visit was a virtual/telehealth visit, some criteria may be missing or patient reported. Any vitals not documented were not able to be obtained and vitals that have been documented are patient reported.   Cardiac Risk Factors include: advanced age (>41men, >41 women)     Objective:     Today's Vitals   08/07/23 1558  PainSc: 7    There is no height or weight on file to calculate BMI.     08/07/2023    4:14 PM 05/28/2023    2:20 PM 04/03/2022    2:09 PM 09/13/2020    4:51 PM 08/10/2019   10:48 AM 08/05/2018    3:02 PM 07/17/2016    1:13 PM  Advanced Directives  Does Patient Have a Medical Advance Directive? Yes Yes Yes No Yes Yes Yes  Type of Advance Directive Living will Healthcare Power of [place];Living will Healthcare Power of [place];Living will   Healthcare Power of [place];Living will;Out of facility DNR (pink MOST or yellow form)   Does patient want to make changes to medical advance directive?     No - Patient declined  No - Patient declined  Copy of Healthcare Power of Attorney in Chart?  No - copy requested No - copy requested      Would patient like information on creating a medical advance directive?    No - Patient declined       Current Medications (verified) Outpatient Encounter Medications as of 08/07/2023  Medication Sig   albuterol  (PROVENTIL ) (2.5 MG/3ML) 0.083% nebulizer solution Take 3 mLs (2.5 mg total) by nebulization every 6 (six) hours  as needed for wheezing or shortness of breath.   albuterol  (VENTOLIN  HFA) 108 (90 Base) MCG/ACT inhaler TAKE 2 PUFFS BY MOUTH EVERY 6 HOURS AS NEEDED FOR WHEEZE OR SHORTNESS OF BREATH   Ascorbic Acid (VITAMIN C) 1000 MG tablet Take 500 mg by mouth daily.    Cholecalciferol (VITAMIN D ) 125 MCG (5000 UT) CAPS Take 1 capsule daily   ezetimibe  (ZETIA ) 10 MG tablet Take 1 tablet (10 mg total) by mouth daily.   lidocaine (LINDAMANTLE) 3 % CREA cream SMARTSIG:Sparingly Topical Daily   loratadine  (CLARITIN ) 10 MG tablet TAKE 1 TABLET BY MOUTH EVERY DAY   Multiple Vitamin (MULTIVITAMIN) tablet Take 1 tablet by mouth daily.   oxybutynin  (DITROPAN ) 5 MG tablet Take 1 tablet (5 mg total) by mouth every 8 (eight) hours as needed for bladder spasms.   phenazopyridine  (PYRIDIUM ) 100 MG tablet Take 1 tablet (100 mg total) by mouth every 8 (eight) hours as needed for pain. Limit use to no more than 3 days of consecutive use.   sodium chloride (OCEAN) 0.65 % SOLN nasal spray Place 1 spray into both nostrils as needed for congestion.   WIXELA INHUB 250-50 MCG/ACT AEPB INHALE 1 PUFF INTO THE LUNGS TWICE A DAY   alendronate  (FOSAMAX ) 70 MG tablet Take with a full glass of water on an empty stomach. (Patient not taking: Reported on 08/07/2023)   No facility-administered encounter  medications on file as of 08/07/2023.    Allergies (verified) Clindamycin/lincomycin, Penicillins, and Statins   History: Past Medical History:  Diagnosis Date   Allergy    Asthma    Atrophic vaginitis    Osteopenia    Past Surgical History:  Procedure Laterality Date   ABDOMINAL HYSTERECTOMY     KNEE SURGERY     Right   SKIN CANCER DESTRUCTION N/A 02/14/2022   Dr. Senora Dame   SPINE SURGERY     History reviewed. No pertinent family history. Social History   Socioeconomic History   Marital status: Married    Spouse name: Gwinda Leopard   Number of children: 5   Years of education: Not on file   Highest education level: Not on file   Occupational History   Not on file  Tobacco Use   Smoking status: Former   Smokeless tobacco: Never   Tobacco comments:    quit 1970's  Vaping Use   Vaping status: Never Used  Substance and Sexual Activity   Alcohol use: No    Alcohol/week: 0.0 standard drinks of alcohol   Drug use: No   Sexual activity: Yes  Other Topics Concern   Not on file  Social History Narrative   Retired Runner, broadcasting/film/video.   14 grand-children    Social Drivers of Corporate investment banker Strain: Low Risk  (08/07/2023)   Overall Financial Resource Strain (CARDIA)    Difficulty of Paying Living Expenses: Not hard at all  Food Insecurity: No Food Insecurity (08/07/2023)   Hunger Vital Sign    Worried About Running Out of Food in the Last Year: Never true    Ran Out of Food in the Last Year: Never true  Transportation Needs: No Transportation Needs (08/07/2023)   PRAPARE - Administrator, Civil Service (Medical): No    Lack of Transportation (Non-Medical): No  Physical Activity: Insufficiently Active (08/07/2023)   Exercise Vital Sign    Days of Exercise per Week: 3 days    Minutes of Exercise per Session: 30 min  Stress: No Stress Concern Present (08/07/2023)   [employer] of Occupational Health - Occupational Stress Questionnaire    Feeling of Stress : Not at all  Social Connections: Socially Integrated (08/07/2023)   Social Connection and Isolation Panel [NHANES]    Frequency of Communication with Friends and Family: More than three times a week    Frequency of Social Gatherings with Friends and Family: More than three times a week    Attends Religious Services: More than 4 times per year    Active Member of [employer] or Organizations: Yes    Attends Engineer, structural: More than 4 times per year    Marital Status: Married    Tobacco Counseling Counseling given: Not Answered Tobacco comments: quit 1970's   Clinical Intake:  Pre-visit preparation completed: Yes  Pain : No/denies  pain Pain Score: 7  Pain Location: Hip Pain Orientation: Right Pain Descriptors / Indicators: Burning, Aching, Dull Pain Onset: More than a month ago Pain Frequency: Constant Pain Relieving Factors: tylenol/cortisone shot is joint  Pain Relieving Factors: tylenol/cortisone shot is joint  Diabetes: No  How often do you need to have someone help you when you read instructions, pamphlets, or other written materials from your doctor or pharmacy?: 1 - Never  Interpreter Needed?: No  Information entered by :: Kieth Pelt LPN   Activities of Daily Living    08/07/2023    4:34 PM 08/07/2023  4:20 PM  In your present state of health, do you have any difficulty performing the following activities:  Hearing? 0 0  Vision? 0 0  Difficulty concentrating or making decisions? 0 0  Walking or climbing stairs? 0 0  Dressing or bathing? 0 0  Doing errands, shopping? 0 0  Preparing Food and eating ? N N  Using the Toilet? N N  In the past six months, have you accidently leaked urine? N Y  Do you have problems with loss of bowel control? N N  Managing your Medications? N N  Managing your Finances? N N  Housekeeping or managing your Housekeeping? N N    Patient Care Team: Jenelle Mis, FNP as PCP - General (Family Medicine) Amanda Jungling Joyceann No, MD as PCP - Cardiology (Cardiology)  Indicate any recent Medical Services you may have received from other than [facility] providers in the past year (date may be approximate).     Assessment:    This is a routine wellness examination for Zineb.  Hearing/Vision screen Hearing Screening - Comments:: No trouble hearing Vision Screening - Comments:: Up to date [facility] [place] [facility] [place]   Goals Addressed             This Visit's Progress    Exercise 3x per week (30 min per time)   On track    Continue to exercise daily.      Patient Stated       Eat healthier       Depression Screen    08/07/2023    4:19 PM 05/28/2023    2:14 PM  04/15/2023    3:28 PM 04/03/2022    2:03 PM 11/09/2021    9:16 AM 04/26/2020   12:05 PM 02/09/2020   11:26 AM  PHQ 2/9 Scores  PHQ - 2 Score 0 0 0 0 0 0 0  PHQ- 9 Score 0 0 1  0      Fall Risk    08/07/2023    4:15 PM 05/28/2023    2:20 PM 04/03/2022    2:12 PM 11/09/2021    9:04 AM 11/09/2021    8:57 AM  Fall Risk   Falls in the past year? 1 0 0 1 0  Number falls in past yr: 0 0 0 0 0  Injury with Fall? 0 0 0 1 0  Comment    feels sore   Risk for fall due to : Impaired balance/gait No Fall Risks No Fall Risks    Follow up Falls evaluation completed;Education provided;Falls prevention discussed Falls prevention discussed;Falls evaluation completed Falls prevention discussed      MEDICARE RISK AT HOME: Medicare Risk at Home Any stairs in or around the home?: No If so, are there any without handrails?: No Home free of loose throw rugs in walkways, pet beds, electrical cords, etc?: Yes Adequate lighting in your home to reduce risk of falls?: Yes Life alert?: No Use of a cane, walker or w/c?: Yes Grab bars in the bathroom?: No Shower chair or bench in shower?: No Elevated toilet seat or a handicapped toilet?: No  TIMED UP AND GO:  Was the test performed?  No    Cognitive Function:        08/07/2023    4:16 PM 05/28/2023    2:21 PM 04/03/2022    2:15 PM  6CIT Screen  What Year? 0 points 0 points 0 points  What month? 0 points 0 points 0 points  What  time? 0 points 0 points 0 points  Count back from 20 0 points 0 points 0 points  Months in reverse 0 points 2 points 0 points  Repeat phrase 2 points 0 points 0 points  Total Score 2 points 2 points 0 points    Immunizations Immunization History  Administered Date(s) Administered   Fluad Quad(high Dose 65+) 12/03/2018   H1N1 01/10/2008   Influenza, High Dose Seasonal PF 12/03/2017, 11/09/2019   Influenza,inj,Quad PF,6+ Mos 01/05/2013, 01/06/2014, 11/14/2014   Influenza-Unspecified 12/27/2010, 12/20/2015, 12/02/2021   Moderna  Sars-Covid-2 Vaccination 11/09/2019, 12/09/2019   Pneumococcal Conjugate-13 01/18/2013   Pneumococcal Polysaccharide-23 12/16/2005, 05/05/2017   Td 09/26/2011   Tdap 09/26/2011    TDAP status: Due, Education has been provided regarding the importance of this vaccine. Advised may receive this vaccine at local pharmacy or Health Dept. Aware to provide a copy of the vaccination record if obtained from local pharmacy or Health Dept. Verbalized acceptance and understanding.  Flu Vaccine status: Up to date  Pneumococcal vaccine status: Up to date  Covid-19 vaccine status: Declined, Education has been provided regarding the importance of this vaccine but patient still declined. Advised may receive this vaccine at local pharmacy or Health Dept.or vaccine clinic. Aware to provide a copy of the vaccination record if obtained from local pharmacy or Health Dept. Verbalized acceptance and understanding.  Qualifies for Shingles Vaccine? Yes   Zostavax completed No   Shingrix Completed?: No.    Education has been provided regarding the importance of this vaccine. Patient has been advised to call insurance company to determine out of pocket expense if they have not yet received this vaccine. Advised may also receive vaccine at local pharmacy or Health Dept. Verbalized acceptance and understanding.  Screening Tests Health Maintenance  Topic Date Due   Zoster Vaccines- Shingrix (1 of 2) Never done   COVID-19 Vaccine (3 - 2024-25 season) 08/23/2023 (Originally 11/03/2022)   INFLUENZA VACCINE  10/03/2023   Medicare Annual Wellness (AWV)  08/06/2024   Pneumonia Vaccine 41+ Years old  Completed   DEXA SCAN  Completed   Hepatitis C Screening  Completed   HPV VACCINES  Aged Out   Meningococcal B Vaccine  Aged Out   DTaP/Tdap/Td  Discontinued   Fecal DNA (Cologuard)  Discontinued    Health Maintenance  Health Maintenance Due  Topic Date Due   Zoster Vaccines- Shingrix (1 of 2) Never done    Colorectal  cancer screening: No longer required.   Mammogram status: Completed  . Repeat every year  Bone Density status: Completed 2025. Results reflect: Bone density results: OSTEOPOROSIS. Repeat every 2 years.  Lung Cancer Screening: (Low Dose CT Chest recommended if Age 57-80 years, 20 pack-year currently smoking OR have quit w/in 15years.) does not qualify.   Lung Cancer Screening Referral:   Additional Screening:  Hepatitis C Screening qualify; Completed 2017  Vision Screening: Recommended annual ophthalmology exams for early detection of glaucoma and other disorders of the eye. Is the patient up to date with their annual eye exam?  Yes  Who is the provider or what is the name of the office in which the patient attends annual eye exams?  If pt is not established with a provider, would they like to be referred to a provider to establish care? No .   Dental Screening: Recommended annual dental exams for proper oral hygiene    Community Resource Referral / Chronic Care Management: CRR required this visit?  No   CCM required this  visit?  No     Plan:     I have personally reviewed and noted the following in the patient's chart:   Medical and social history Use of alcohol, tobacco or illicit drugs  Current medications and supplements including opioid prescriptions. Patient is not currently taking opioid prescriptions. Functional ability and status Nutritional status Physical activity Advanced directives List of other physicians Hospitalizations, surgeries, and ER visits in previous 12 months Vitals Screenings to include cognitive, depression, and falls Referrals and appointments  In addition, I have reviewed and discussed with patient certain preventive protocols, quality metrics, and best practice recommendations. A written personalized care plan for preventive services as well as general preventive health recommendations were provided to patient.     Kieth Pelt,  LPN   [id number]   After Visit Summary: (MyChart) Due to this being a telephonic visit, the after visit summary with patients personalized plan was offered to patient via MyChart   Nurse Notes:

## 2023-08-07 NOTE — Patient Instructions (Signed)
 Jennifer Hays , Thank you for taking time to come for your Medicare Wellness Visit. I appreciate your ongoing commitment to your health goals. Please review the following plan we discussed and let me know if I can assist you in the future.   Screening recommendations/referrals:  Mammogram: up to date Bone Density: up to date Recommended yearly ophthalmology/optometry visit for glaucoma screening and checkup Recommended yearly dental visit for hygiene and checkup  Vaccinations: Influenza vaccine: up to date Pneumococcal vaccine: up to date Tdap vaccine: up to date         Preventive Care 65 Years and Older, Female Preventive care refers to lifestyle choices and visits with your health care provider that can promote health and wellness. What does preventive care include? A yearly physical exam. This is also called an annual well check. Dental exams once or twice a year. Routine eye exams. Ask your health care provider how often you should have your eyes checked. Personal lifestyle choices, including: Daily care of your teeth and gums. Regular physical activity. Eating a healthy diet. Avoiding tobacco and drug use. Limiting alcohol use. Practicing safe sex. Taking low-dose aspirin every day. Taking vitamin and mineral supplements as recommended by your health care provider. What happens during an annual well check? The services and screenings done by your health care provider during your annual well check will depend on your age, overall health, lifestyle risk factors, and family history of disease. Counseling  Your health care provider may ask you questions about your: Alcohol use. Tobacco use. Drug use. Emotional well-being. Home and relationship well-being. Sexual activity. Eating habits. History of falls. Memory and ability to understand (cognition). Work and work Astronomer. Reproductive health. Screening  You may have the following tests or measurements: Height,  weight, and BMI. Blood pressure. Lipid and cholesterol levels. These may be checked every 5 years, or more frequently if you are over 74 years old. Skin check. Lung cancer screening. You may have this screening every year starting at age 57 if you have a 30-pack-year history of smoking and currently smoke or have quit within the past 15 years. Fecal occult blood test (FOBT) of the stool. You may have this test every year starting at age 70. Flexible sigmoidoscopy or colonoscopy. You may have a sigmoidoscopy every 5 years or a colonoscopy every 10 years starting at age 41. Hepatitis C blood test. Hepatitis B blood test. Sexually transmitted disease (STD) testing. Diabetes screening. This is done by checking your blood sugar (glucose) after you have not eaten for a while (fasting). You may have this done every 1-3 years. Bone density scan. This is done to screen for osteoporosis. You may have this done starting at age 30. Mammogram. This may be done every 1-2 years. Talk to your health care provider about how often you should have regular mammograms. Talk with your health care provider about your test results, treatment options, and if necessary, the need for more tests. Vaccines  Your health care provider may recommend certain vaccines, such as: Influenza vaccine. This is recommended every year. Tetanus, diphtheria, and acellular pertussis (Tdap, Td) vaccine. You may need a Td booster every 10 years. Zoster vaccine. You may need this after age 66. Pneumococcal 13-valent conjugate (PCV13) vaccine. One dose is recommended after age 38. Pneumococcal polysaccharide (PPSV23) vaccine. One dose is recommended after age 68. Talk to your health care provider about which screenings and vaccines you need and how often you need them. This information is not intended to  replace advice given to you by your health care provider. Make sure you discuss any questions you have with your health care  provider. Document Released: 03/17/2015 Document Revised: 11/08/2015 Document Reviewed: 12/20/2014 Elsevier Interactive Patient Education  2017 [employer].  Fall Prevention in the Home Falls can cause injuries. They can happen to people of all ages. There are many things you can do to make your home safe and to help prevent falls. What can I do on the outside of my home? Regularly fix the edges of walkways and driveways and fix any cracks. Remove anything that might make you trip as you walk through a door, such as a raised step or threshold. Trim any bushes or trees on the path to your home. Use bright outdoor lighting. Clear any walking paths of anything that might make someone trip, such as rocks or tools. Regularly check to see if handrails are loose or broken. Make sure that both sides of any steps have handrails. Any raised decks and porches should have guardrails on the edges. Have any leaves, snow, or ice cleared regularly. Use sand or salt on walking paths during winter. Clean up any spills in your garage right away. This includes oil or grease spills. What can I do in the bathroom? Use night lights. Install grab bars by the toilet and in the tub and shower. Do not use towel bars as grab bars. Use non-skid mats or decals in the tub or shower. If you need to sit down in the shower, use a plastic, non-slip stool. Keep the floor dry. Clean up any water that spills on the floor as soon as it happens. Remove soap buildup in the tub or shower regularly. Attach bath mats securely with double-sided non-slip rug tape. Do not have throw rugs and other things on the floor that can make you trip. What can I do in the bedroom? Use night lights. Make sure that you have a light by your bed that is easy to reach. Do not use any sheets or blankets that are too big for your bed. They should not hang down onto the floor. Have a firm chair that has side arms. You can use this for support while  you get dressed. Do not have throw rugs and other things on the floor that can make you trip. What can I do in the kitchen? Clean up any spills right away. Avoid walking on wet floors. Keep items that you use a lot in easy-to-reach places. If you need to reach something above you, use a strong step stool that has a grab bar. Keep electrical cords out of the way. Do not use floor polish or wax that makes floors slippery. If you must use wax, use non-skid floor wax. Do not have throw rugs and other things on the floor that can make you trip. What can I do with my stairs? Do not leave any items on the stairs. Make sure that there are handrails on both sides of the stairs and use them. Fix handrails that are broken or loose. Make sure that handrails are as long as the stairways. Check any carpeting to make sure that it is firmly attached to the stairs. Fix any carpet that is loose or worn. Avoid having throw rugs at the top or bottom of the stairs. If you do have throw rugs, attach them to the floor with carpet tape. Make sure that you have a light switch at the top of the stairs and the  bottom of the stairs. If you do not have them, ask someone to add them for you. What else can I do to help prevent falls? Wear shoes that: Do not have high heels. Have rubber bottoms. Are comfortable and fit you well. Are closed at the toe. Do not wear sandals. If you use a stepladder: Make sure that it is fully opened. Do not climb a closed stepladder. Make sure that both sides of the stepladder are locked into place. Ask someone to hold it for you, if possible. Clearly mark and make sure that you can see: Any grab bars or handrails. First and last steps. Where the edge of each step is. Use tools that help you move around (mobility aids) if they are needed. These include: Canes. Walkers. Scooters. Crutches. Turn on the lights when you go into a dark area. Replace any light bulbs as soon as they burn  out. Set up your furniture so you have a clear path. Avoid moving your furniture around. If any of your floors are uneven, fix them. If there are any pets around you, be aware of where they are. Review your medicines with your doctor. Some medicines can make you feel dizzy. This can increase your chance of falling. Ask your doctor what other things that you can do to help prevent falls. This information is not intended to replace advice given to you by your health care provider. Make sure you discuss any questions you have with your health care provider. Document Released: 12/15/2008 Document Revised: 07/27/2015 Document Reviewed: 03/25/2014 Elsevier Interactive Patient Education  2017 [employer].

## 2023-08-11 ENCOUNTER — Telehealth: Payer: Self-pay | Admitting: Family Medicine

## 2023-08-11 NOTE — Telephone Encounter (Signed)
 Erroneous encounter. Please disregard.

## 2023-08-12 NOTE — Progress Notes (Signed)
 Subjective:   Jennifer Hays is a 78 y.o. female who presents for Medicare Annual (Subsequent) preventive examination.  Visit Complete: Virtual I connected with  Jennifer Hays on 08/12/23 by a audio enabled telemedicine application and verified that I am speaking with the correct person using two identifiers.  Patient Location: Home  Provider Location: Home Office  I discussed the limitations of evaluation and management by telemedicine. The patient expressed understanding and agreed to proceed.  Vital Signs: Because this visit was a virtual/telehealth visit, some criteria may be missing or patient reported. Any vitals not documented were not able to be obtained and vitals that have been documented are patient reported.   Cardiac Risk Factors include: advanced age (>62men, >18 women)     Objective:     Today's Vitals   08/07/23 1558  PainSc: 7    There is no height or weight on file to calculate BMI.     08/07/2023    4:14 PM 05/28/2023    2:20 PM 04/03/2022    2:09 PM 09/13/2020    4:51 PM 08/10/2019   10:48 AM 08/05/2018    3:02 PM 07/17/2016    1:13 PM  Advanced Directives  Does Patient Have a Medical Advance Directive? Yes Yes Yes No Yes Yes Yes  Type of Advance Directive Living will Healthcare Power of [place];Living will Healthcare Power of [place];Living will   Healthcare Power of [place];Living will;Out of facility DNR (pink MOST or yellow form)   Does patient want to make changes to medical advance directive?     No - Patient declined  No - Patient declined  Copy of Healthcare Power of Attorney in Chart?  No - copy requested No - copy requested      Would patient like information on creating a medical advance directive?    No - Patient declined       Current Medications (verified) Outpatient Encounter Medications as of 08/07/2023  Medication Sig   albuterol  (PROVENTIL ) (2.5 MG/3ML) 0.083% nebulizer solution Take 3 mLs (2.5 mg total) by nebulization every 6 (six) hours  as needed for wheezing or shortness of breath.   albuterol  (VENTOLIN  HFA) 108 (90 Base) MCG/ACT inhaler TAKE 2 PUFFS BY MOUTH EVERY 6 HOURS AS NEEDED FOR WHEEZE OR SHORTNESS OF BREATH   Ascorbic Acid (VITAMIN C) 1000 MG tablet Take 500 mg by mouth daily.    Cholecalciferol (VITAMIN D ) 125 MCG (5000 UT) CAPS Take 1 capsule daily   ezetimibe  (ZETIA ) 10 MG tablet Take 1 tablet (10 mg total) by mouth daily.   lidocaine (LINDAMANTLE) 3 % CREA cream SMARTSIG:Sparingly Topical Daily   loratadine  (CLARITIN ) 10 MG tablet TAKE 1 TABLET BY MOUTH EVERY DAY   Multiple Vitamin (MULTIVITAMIN) tablet Take 1 tablet by mouth daily.   oxybutynin  (DITROPAN ) 5 MG tablet Take 1 tablet (5 mg total) by mouth every 8 (eight) hours as needed for bladder spasms.   phenazopyridine  (PYRIDIUM ) 100 MG tablet Take 1 tablet (100 mg total) by mouth every 8 (eight) hours as needed for pain. Limit use to no more than 3 days of consecutive use.   sodium chloride (OCEAN) 0.65 % SOLN nasal spray Place 1 spray into both nostrils as needed for congestion.   WIXELA INHUB 250-50 MCG/ACT AEPB INHALE 1 PUFF INTO THE LUNGS TWICE A DAY   alendronate  (FOSAMAX ) 70 MG tablet Take with a full glass of water on an empty stomach. (Patient not taking: Reported on 08/07/2023)   No facility-administered encounter  medications on file as of 08/07/2023.    Allergies (verified) Clindamycin/lincomycin, Penicillins, and Statins   History: Past Medical History:  Diagnosis Date   Allergy    Asthma    Atrophic vaginitis    Osteopenia    Past Surgical History:  Procedure Laterality Date   ABDOMINAL HYSTERECTOMY     KNEE SURGERY     Right   SKIN CANCER DESTRUCTION N/A 02/14/2022   Dr. Senora Dame   SPINE SURGERY     History reviewed. No pertinent family history. Social History   Socioeconomic History   Marital status: Married    Spouse name: Jennifer Hays   Number of children: 5   Years of education: Not on file   Highest education level: Not on file   Occupational History   Not on file  Tobacco Use   Smoking status: Former   Smokeless tobacco: Never   Tobacco comments:    quit 1970's  Vaping Use   Vaping status: Never Used  Substance and Sexual Activity   Alcohol use: No    Alcohol/week: 0.0 standard drinks of alcohol   Drug use: No   Sexual activity: Yes  Other Topics Concern   Not on file  Social History Narrative   Retired Runner, broadcasting/film/video.   14 grand-children    Social Drivers of Corporate investment banker Strain: Low Risk  (08/07/2023)   Overall Financial Resource Strain (CARDIA)    Difficulty of Paying Living Expenses: Not hard at all  Food Insecurity: No Food Insecurity (08/07/2023)   Hunger Vital Sign    Worried About Running Out of Food in the Last Year: Never true    Ran Out of Food in the Last Year: Never true  Transportation Needs: No Transportation Needs (08/07/2023)   PRAPARE - Administrator, Civil Service (Medical): No    Lack of Transportation (Non-Medical): No  Physical Activity: Insufficiently Active (08/07/2023)   Exercise Vital Sign    Days of Exercise per Week: 3 days    Minutes of Exercise per Session: 30 min  Stress: No Stress Concern Present (08/07/2023)   [employer] of Occupational Health - Occupational Stress Questionnaire    Feeling of Stress : Not at all  Social Connections: Socially Integrated (08/07/2023)   Social Connection and Isolation Panel [NHANES]    Frequency of Communication with Friends and Family: More than three times a week    Frequency of Social Gatherings with Friends and Family: More than three times a week    Attends Religious Services: More than 4 times per year    Active Member of [employer] or Organizations: Yes    Attends Engineer, structural: More than 4 times per year    Marital Status: Married    Tobacco Counseling Counseling given: Not Answered Tobacco comments: quit 1970's   Clinical Intake:  Pre-visit preparation completed: Yes  Pain : No/denies  pain Pain Score: 7  Pain Location: Hip Pain Orientation: Right Pain Descriptors / Indicators: Burning, Aching, Dull Pain Onset: More than a month ago Pain Frequency: Constant Pain Relieving Factors: tylenol/cortisone shot is joint  Pain Relieving Factors: tylenol/cortisone shot is joint  Diabetes: No  How often do you need to have someone help you when you read instructions, pamphlets, or other written materials from your doctor or pharmacy?: 1 - Never  Interpreter Needed?: No  Information entered by :: Kieth Pelt LPN   Activities of Daily Living    08/07/2023    4:34 PM 08/07/2023  4:20 PM  In your present state of health, do you have any difficulty performing the following activities:  Hearing? 0 0  Vision? 0 0  Difficulty concentrating or making decisions? 0 0  Walking or climbing stairs? 0 0  Dressing or bathing? 0 0  Doing errands, shopping? 0 0  Preparing Food and eating ? N N  Using the Toilet? N N  In the past six months, have you accidently leaked urine? N Y  Do you have problems with loss of bowel control? N N  Managing your Medications? N N  Managing your Finances? N N  Housekeeping or managing your Housekeeping? N N    Patient Care Team: Jenelle Mis, FNP as PCP - General (Family Medicine) Amanda Jungling Joyceann No, MD as PCP - Cardiology (Cardiology)  Indicate any recent Medical Services you may have received from other than [facility] providers in the past year (date may be approximate).     Assessment:    This is a routine wellness examination for [place].  Hearing/Vision screen Hearing Screening - Comments:: No trouble hearing Vision Screening - Comments:: Up to date [facility] [place] [facility]   Goals Addressed             This Visit's Progress    Exercise 3x per week (30 min per time)   On track    Continue to exercise daily.      Patient Stated       Eat healthier       Depression Screen    08/07/2023    4:19 PM 05/28/2023    2:14 PM  04/15/2023    3:28 PM 04/03/2022    2:03 PM 11/09/2021    9:16 AM 04/26/2020   12:05 PM 02/09/2020   11:26 AM  PHQ 2/9 Scores  PHQ - 2 Score 0 0 0 0 0 0 0  PHQ- 9 Score 0 0 1  0      Fall Risk    08/07/2023    4:15 PM 05/28/2023    2:20 PM 04/03/2022    2:12 PM 11/09/2021    9:04 AM 11/09/2021    8:57 AM  Fall Risk   Falls in the past year? 1 0 0 1 0  Number falls in past yr: 0 0 0 0 0  Injury with Fall? 0 0 0 1 0  Comment    feels sore   Risk for fall due to : Impaired balance/gait No Fall Risks No Fall Risks    Follow up Falls evaluation completed;Education provided;Falls prevention discussed Falls prevention discussed;Falls evaluation completed Falls prevention discussed      MEDICARE RISK AT HOME: Medicare Risk at Home Any stairs in or around the home?: No If so, are there any without handrails?: No Home free of loose throw rugs in walkways, pet beds, electrical cords, etc?: Yes Adequate lighting in your home to reduce risk of falls?: Yes Life alert?: No Use of a cane, walker or w/c?: Yes Grab bars in the bathroom?: No Shower chair or bench in shower?: No Elevated toilet seat or a handicapped toilet?: No  TIMED UP AND GO:  Was the test performed?  No    Cognitive Function:        08/07/2023    4:16 PM 05/28/2023    2:21 PM 04/03/2022    2:15 PM  6CIT Screen  What Year? 0 points 0 points 0 points  What month? 0 points 0 points 0 points  What  time? 0 points 0 points 0 points  Count back from 20 0 points 0 points 0 points  Months in reverse 0 points 2 points 0 points  Repeat phrase 2 points 0 points 0 points  Total Score 2 points 2 points 0 points    Immunizations Immunization History  Administered Date(s) Administered   Fluad Quad(high Dose 65+) 12/03/2018   H1N1 01/10/2008   Influenza, High Dose Seasonal PF 12/03/2017, 11/09/2019   Influenza,inj,Quad PF,6+ Mos 01/05/2013, 01/06/2014, 11/14/2014   Influenza-Unspecified 12/27/2010, 12/20/2015, 12/02/2021   Moderna  Sars-Covid-2 Vaccination 11/09/2019, 12/09/2019   Pneumococcal Conjugate-13 01/18/2013   Pneumococcal Polysaccharide-23 12/16/2005, 05/05/2017   Td 09/26/2011   Tdap 09/26/2011    TDAP status: Due, Education has been provided regarding the importance of this vaccine. Advised may receive this vaccine at local pharmacy or Health Dept. Aware to provide a copy of the vaccination record if obtained from local pharmacy or Health Dept. Verbalized acceptance and understanding.  Flu Vaccine status: Up to date  Pneumococcal vaccine status: Up to date  Covid-19 vaccine status: Declined, Education has been provided regarding the importance of this vaccine but patient still declined. Advised may receive this vaccine at local pharmacy or Health Dept.or vaccine clinic. Aware to provide a copy of the vaccination record if obtained from local pharmacy or Health Dept. Verbalized acceptance and understanding.  Qualifies for Shingles Vaccine? Yes   Zostavax completed No   Shingrix Completed?: No.    Education has been provided regarding the importance of this vaccine. Patient has been advised to call insurance company to determine out of pocket expense if they have not yet received this vaccine. Advised may also receive vaccine at local pharmacy or Health Dept. Verbalized acceptance and understanding.  Screening Tests Health Maintenance  Topic Date Due   Zoster Vaccines- Shingrix (1 of 2) Never done   COVID-19 Vaccine (3 - 2024-25 season) 08/23/2023 (Originally 11/03/2022)   INFLUENZA VACCINE  10/03/2023   Medicare Annual Wellness (AWV)  08/06/2024   Pneumonia Vaccine 31+ Years old  Completed   DEXA SCAN  Completed   Hepatitis C Screening  Completed   HPV VACCINES  Aged Out   Meningococcal B Vaccine  Aged Out   DTaP/Tdap/Td  Discontinued   Fecal DNA (Cologuard)  Discontinued    Health Maintenance  Health Maintenance Due  Topic Date Due   Zoster Vaccines- Shingrix (1 of 2) Never done    Colorectal  cancer screening: No longer required.   Mammogram status: Completed  . Repeat every year  Bone Density status: Completed 2025. Results reflect: Bone density results: OSTEOPOROSIS. Repeat every 2 years.  Lung Cancer Screening: (Low Dose CT Chest recommended if Age 79-80 years, 20 pack-year currently smoking OR have quit w/in 15years.) does not qualify.   Lung Cancer Screening Referral:   Additional Screening:  Hepatitis C Screening qualify; Completed 2017  Vision Screening: Recommended annual ophthalmology exams for early detection of glaucoma and other disorders of the eye. Is the patient up to date with their annual eye exam?  Yes  Who is the provider or what is the name of the office in which the patient attends annual eye exams?  If pt is not established with a provider, would they like to be referred to a provider to establish care? No .   Dental Screening: Recommended annual dental exams for proper oral hygiene    Community Resource Referral / Chronic Care Management: CRR required this visit?  No   CCM required this  visit?  No     Plan:     I have personally reviewed and noted the following in the patient's chart:   Medical and social history Use of alcohol, tobacco or illicit drugs  Current medications and supplements including opioid prescriptions. Patient is not currently taking opioid prescriptions. Functional ability and status Nutritional status Physical activity Advanced directives List of other physicians Hospitalizations, surgeries, and ER visits in previous 12 months Vitals Screenings to include cognitive, depression, and falls Referrals and appointments  In addition, I have reviewed and discussed with patient certain preventive protocols, quality metrics, and best practice recommendations. A written personalized care plan for preventive services as well as general preventive health recommendations were provided to patient.     Jenelle Mis,  FNP   08/12/2023   After Visit Summary: (MyChart) Due to this being a telephonic visit, the after visit summary with patients personalized plan was offered to patient via MyChart   Nurse Notes:

## 2023-10-24 ENCOUNTER — Encounter: Payer: Self-pay | Admitting: Radiology

## 2023-11-24 ENCOUNTER — Telehealth: Payer: Self-pay

## 2023-11-24 NOTE — Telephone Encounter (Signed)
 Patient having issues with IC (interstitial cystitis).  Needing a soon as possible  appointment.  Call:  [phone]

## 2023-11-24 NOTE — Telephone Encounter (Signed)
 Return call to pt. Pt scheduled and added to the waiting list. Verbalized understanding.

## 2023-11-28 ENCOUNTER — Ambulatory Visit: Admitting: Urology

## 2024-01-01 ENCOUNTER — Telehealth: Payer: Self-pay

## 2024-01-01 NOTE — Telephone Encounter (Signed)
 Received [employer] reverification form. Placed in providers green folder w/ explanation of need. Awaiting decision

## 2024-01-01 NOTE — Telephone Encounter (Signed)
 Copied from CRM [id number]. Topic: General - Call Back - No Documentation >> Jan 01, 2024  4:03 PM Alfonso ORN wrote: Reason for CRM: pt stated she is permanently disabled and happened at work awhile ago and just needs a renewal. Please contact if handicap renewal cannot be completed as she stated she may take to her workers radiographer, therapeutic.

## 2024-01-05 ENCOUNTER — Encounter: Payer: Self-pay | Admitting: Radiology

## 2024-01-07 ENCOUNTER — Telehealth: Payer: Self-pay

## 2024-01-07 NOTE — Telephone Encounter (Signed)
 Provider reviewed form and stated that   last I saw her  pt. Was exercising. If she has a recent note from another provider of injuries that would be great or she can schedule an appointment  Called and gave pt. Information by phone she was not satisfied with information .  Requested to have form mailed back to her [place] address on file and she would take form to her current orthopedic provider to have completed.  CMA agreed and mailed to patient on 01/07/2024.   SRP, CMA.

## 2024-01-07 NOTE — Telephone Encounter (Signed)
 Do you happen to have her form? Thank you!   Copied from CRM #[id number]. Topic: General - Other >> Jan 07, 2024 11:05 AM Treva T wrote: Reason for CRM: Patient calling to check status of handicap placard form to see if it is available for pick up at office. Dropped off form last week. States current placard will expire at end of month.   Called and spoke to front office, per [place] forms has not been completed, however patient will be notified via phone once ready to be picked up.   Patient informed of above, verbalized understanding, requested to inform office she is inquiring on form completion.   Patient can be reached at [phone] once forms completed, as she may need forms mailed to a temporary residential address.   Thank you

## 2024-03-08 ENCOUNTER — Ambulatory Visit: Admitting: Urology

## 2024-03-22 ENCOUNTER — Other Ambulatory Visit: Payer: Self-pay | Admitting: Family Medicine

## 2024-03-22 NOTE — Telephone Encounter (Unsigned)
 Copied from CRM #[id number]. Topic: Clinical - Medication Refill >> Mar 22, 2024  2:32 PM Nessti S wrote: Medication: ezetimibe  (ZETIA ) 10 MG tablet  Has the patient contacted their pharmacy? Yes (Agent: If no, request that the patient contact the pharmacy for the refill. If patient does not wish to contact the pharmacy document the reason why and proceed with request.) (Agent: If yes, when and what did the pharmacy advise?)  This is the patient's preferred pharmacy:  [id number] [place], [place] - [address] [place] [address] Phone: [phone] Fax: [phone]  Is this the correct pharmacy for this prescription? Yes If no, delete pharmacy and type the correct one.   Has the prescription been filled recently? No  Is the patient out of the medication? No. Only have 8-10 left  Has the patient been seen for an appointment in the last year OR does the patient have an upcoming appointment? Yes  Can we respond through MyChart? No  Agent: Please be advised that Rx refills may take up to 3 business days. We ask that you follow-up with your pharmacy.

## 2024-03-23 MED ORDER — EZETIMIBE 10 MG PO TABS: 10.0000 mg | ORAL_TABLET | Freq: Every day | ORAL | 0 refills | Status: AC

## 2024-03-23 NOTE — Telephone Encounter (Signed)
 Requested Prescriptions  Pending Prescriptions Disp Refills   ezetimibe  (ZETIA ) 10 MG tablet 90 tablet 0    Sig: Take 1 tablet (10 mg total) by mouth daily.     Cardiovascular:  Antilipid - Sterol Transport Inhibitors Failed - 03/23/2024 12:59 PM      Failed - Lipid Panel in normal range within the last 12 months    Cholesterol  Date Value Ref Range Status  05/28/2023 284 (H) <200 mg/dL Final   LDL Cholesterol (Calc)  Date Value Ref Range Status  05/28/2023 199 (H) mg/dL (calc) Final    Comment:    LDL-C levels > or = 190 mg/dL may indicate familial  hypercholesterolemia (FH). Clinical assessment and  measurement of blood lipid levels should be  considered for all first degree relatives of patients with an FH diagnosis. LDL Cholesterol (LDL-C) levels > or = 300 mg/dL may indicate homozygous familial hypercholesterolemia (HoFH). Untreated,  these extremely high LDL-C levels can result in premature CV events and mortality. Patients should be identified early and provided appropriate interventions to reduce the cumulative LDL-C burden from birth. . For questions about testing for familial hypercholesterolemia, please call Engineer, Materials at 1.866.GENE.INFO. SABRA Veatrice DASEN, et al. J National Lipid Association  Recommendations for Patient-Centered Management of Dyslipidemia: Part 1 Journal of  Clinical Lipidology 2015;9(2), 129-169. Cuchel, M. et al. (2014). Homozygous familial hypercholesterolaemia: new insights and guidance for clinicians to improve detection and clinical management. European Heart Journal, 35(32), [phone]. Reference range: <100 . Desirable range <100 mg/dL for primary prevention;   <70 mg/dL for patients with CHD or diabetic patients  with > or = 2 CHD risk factors. SABRA LDL-C is now calculated using the Martin-Hopkins  calculation, which is a validated novel method providing  better accuracy than the Friedewald equation in the  estimation  of LDL-C.  Gladis APPLETHWAITE et al. SANDREA. 7986;689(80): 2061-2068  (http://education.Quest[employer]ostics.com/faq/FAQ164)    HDL  Date Value Ref Range Status  05/28/2023 56 > OR = 50 mg/dL Final   Triglycerides  Date Value Ref Range Status  05/28/2023 137 <150 mg/dL Final         Passed - AST in normal range and within 360 days    AST  Date Value Ref Range Status  05/28/2023 23 10 - 35 U/L Final         Passed - ALT in normal range and within 360 days    ALT  Date Value Ref Range Status  05/28/2023 22 6 - 29 U/L Final         Passed - Patient is not pregnant      Passed - Valid encounter within last 12 months    Recent Outpatient Visits           11 months ago Intrinsic asthma   [place] [facility] Kayla Jeoffrey RAMAN, FNP   1 year ago IC (interstitial cystitis)   [place] [facility] Kayla Jeoffrey RAMAN, FNP   1 year ago Sore throat   [place] [facility] [place], [place] DASEN, MD   1 year ago Swelling, mass, or lump on face   [place] [facility] Kayla Jeoffrey RAMAN, FNP   2 years ago Mixed hyperlipidemia   [place] [facility] Kayla Jeoffrey RAMAN, [place]

## 2024-05-31 ENCOUNTER — Encounter: Admitting: Family Medicine

## 2024-06-10 ENCOUNTER — Encounter: Admitting: Family Medicine

## 2024-08-12 ENCOUNTER — Ambulatory Visit
